# Patient Record
Sex: Female | Born: 1950 | Race: White | Hispanic: Yes | Marital: Married | Smoking: Former smoker
Health system: Southern US, Community
[De-identification: ages and names within clinical notes are randomized; demographics above are authoritative.]

## PROBLEM LIST (undated history)

## (undated) DIAGNOSIS — Z9221 Personal history of antineoplastic chemotherapy: Secondary | ICD-10-CM

## (undated) DIAGNOSIS — E785 Hyperlipidemia, unspecified: Secondary | ICD-10-CM

## (undated) DIAGNOSIS — B009 Herpesviral infection, unspecified: Secondary | ICD-10-CM

## (undated) DIAGNOSIS — Z853 Personal history of malignant neoplasm of breast: Secondary | ICD-10-CM

## (undated) DIAGNOSIS — C50919 Malignant neoplasm of unspecified site of unspecified female breast: Secondary | ICD-10-CM

## (undated) DIAGNOSIS — Z923 Personal history of irradiation: Secondary | ICD-10-CM

## (undated) DIAGNOSIS — C801 Malignant (primary) neoplasm, unspecified: Secondary | ICD-10-CM

## (undated) HISTORY — PX: BREAST LUMPECTOMY: SHX2

## (undated) HISTORY — DX: Hyperlipidemia, unspecified: E78.5

## (undated) HISTORY — DX: Malignant neoplasm of unspecified site of unspecified female breast: C50.919

## (undated) HISTORY — DX: Malignant (primary) neoplasm, unspecified: C80.1

## (undated) HISTORY — PX: WISDOM TOOTH EXTRACTION: SHX21

## (undated) HISTORY — DX: Herpesviral infection, unspecified: B00.9

## (undated) HISTORY — PX: COLON SURGERY: SHX602

---

## 2011-07-15 DIAGNOSIS — Z853 Personal history of malignant neoplasm of breast: Secondary | ICD-10-CM

## 2011-07-15 HISTORY — DX: Personal history of malignant neoplasm of breast: Z85.3

## 2011-08-14 DIAGNOSIS — C801 Malignant (primary) neoplasm, unspecified: Secondary | ICD-10-CM | POA: Insufficient documentation

## 2011-08-14 HISTORY — PX: SENTINEL LYMPH NODE BIOPSY: SHX2392

## 2011-08-14 HISTORY — DX: Malignant (primary) neoplasm, unspecified: C80.1

## 2011-08-14 HISTORY — PX: BREAST LUMPECTOMY: SHX2

## 2011-09-14 HISTORY — PX: PORTACATH PLACEMENT: SHX2246

## 2011-11-12 DIAGNOSIS — Z9221 Personal history of antineoplastic chemotherapy: Secondary | ICD-10-CM

## 2011-11-12 HISTORY — DX: Personal history of antineoplastic chemotherapy: Z92.21

## 2012-03-01 ENCOUNTER — Encounter: Payer: Self-pay | Admitting: *Deleted

## 2012-03-01 NOTE — Progress Notes (Signed)
Received a letter and records from patient and her husband requested for me to contact them via email due to them being out of the country.  I have confirmed 05/11/12 appt w/ pt's husband [name]).  Mailed before appt letter & packet to pt.  Took paperwork to [facility] for chart.

## 2012-03-09 ENCOUNTER — Other Ambulatory Visit: Payer: Self-pay | Admitting: *Deleted

## 2012-03-09 DIAGNOSIS — C50919 Malignant neoplasm of unspecified site of unspecified female breast: Secondary | ICD-10-CM

## 2012-05-11 ENCOUNTER — Other Ambulatory Visit (HOSPITAL_BASED_OUTPATIENT_CLINIC_OR_DEPARTMENT_OTHER): Payer: Federal, State, Local not specified - PPO | Admitting: Lab

## 2012-05-11 ENCOUNTER — Ambulatory Visit: Payer: Federal, State, Local not specified - PPO

## 2012-05-11 ENCOUNTER — Ambulatory Visit (HOSPITAL_BASED_OUTPATIENT_CLINIC_OR_DEPARTMENT_OTHER): Payer: Federal, State, Local not specified - PPO | Admitting: Oncology

## 2012-05-11 VITALS — BP 127/86 | HR 75 | Temp 98.3°F | Resp 20 | Ht <= 58 in | Wt 158.1 lb

## 2012-05-11 DIAGNOSIS — Z171 Estrogen receptor negative status [ER-]: Secondary | ICD-10-CM

## 2012-05-11 DIAGNOSIS — C50919 Malignant neoplasm of unspecified site of unspecified female breast: Secondary | ICD-10-CM

## 2012-05-11 LAB — CBC WITH DIFFERENTIAL/PLATELET
BASO%: 0.4 % (ref 0.0–2.0)
Basophils Absolute: 0 10e3/uL (ref 0.0–0.1)
EOS%: 3.7 % (ref 0.0–7.0)
Eosinophils Absolute: 0.2 10e3/uL (ref 0.0–0.5)
HCT: 40.9 % (ref 34.8–46.6)
HGB: 13.9 g/dL (ref 11.6–15.9)
LYMPH%: 52.9 % — ABNORMAL HIGH (ref 14.0–49.7)
MCH: 31 pg (ref 25.1–34.0)
MCHC: 34 g/dL (ref 31.5–36.0)
MCV: 91.3 fL (ref 79.5–101.0)
MONO#: 0.5 10e3/uL (ref 0.1–0.9)
MONO%: 8.8 % (ref 0.0–14.0)
NEUT#: 1.8 10e3/uL (ref 1.5–6.5)
NEUT%: 34.2 % — ABNORMAL LOW (ref 38.4–76.8)
Platelets: 243 10e3/uL (ref 145–400)
RBC: 4.48 10e6/uL (ref 3.70–5.45)
RDW: 12.2 % (ref 11.2–14.5)
WBC: 5.2 10e3/uL (ref 3.9–10.3)
lymph#: 2.8 10e3/uL (ref 0.9–3.3)

## 2012-05-11 LAB — COMPREHENSIVE METABOLIC PANEL (CC13)
Albumin: 3.5 g/dL (ref 3.5–5.0)
Alkaline Phosphatase: 100 U/L (ref 40–150)
BUN: 14 mg/dL (ref 7.0–26.0)
Creatinine: 0.8 mg/dL (ref 0.6–1.1)
Glucose: 88 mg/dl (ref 70–99)
Potassium: 4.2 mEq/L (ref 3.5–5.1)

## 2012-05-11 MED ORDER — TAMOXIFEN CITRATE 20 MG PO TABS: 20.0000 mg | ORAL_TABLET | Freq: Every day | ORAL | Status: AC

## 2012-05-11 MED ORDER — TAMOXIFEN CITRATE 20 MG PO TABS: 20.0000 mg | ORAL_TABLET | Freq: Every day | ORAL | Status: DC

## 2012-05-13 ENCOUNTER — Telehealth: Payer: Self-pay | Admitting: *Deleted

## 2012-05-13 NOTE — Telephone Encounter (Signed)
Gave patient appointment for 06-02-2012 at 12:30pm 60 minute and port a cath flush after the md appointment

## 2012-05-15 ENCOUNTER — Encounter: Payer: Self-pay | Admitting: Oncology

## 2012-05-15 ENCOUNTER — Other Ambulatory Visit: Payer: Self-pay | Admitting: Oncology

## 2012-05-15 DIAGNOSIS — C50919 Malignant neoplasm of unspecified site of unspecified female breast: Secondary | ICD-10-CM | POA: Insufficient documentation

## 2012-05-15 NOTE — Progress Notes (Signed)
ID: Nichole [name]   DOB: 06-12-1951  MR#: [id number]  ION#:[id number]  PCP: No primary provider on file. GYN:  SU:  OTHER MD:   HISTORY OF PRESENT ILLNESS: The patient herself noted a mass in her right breast and brought it to her physician's attention. She was living in [place] at that time. An ultrasound was done, showing a 1.7 cm spiculated nodule in the right breast with associated calcifications. There were no other findings of concern.  Lumpectomy was performed, with reportedly negative margins (I do not have those records). In Dr. Glori Luis note 11/04/2011 this tumor is described as triple positive.  The patient moved to [facility] for further treatment, and sentinel lymph node sampling in December of 2012 was negative. The original tissue obtained in [place] was reviewed, showing a 1.6 cm, grade 3 invasive ductal carcinoma. and the prognostic panel obtained in [place] showed the tumor to be estrogen receptor positive, progesterone receptor negative, and HER-2 negative in the invasive tumor (it was positive in the in situ tumor; SCT 12-30 at [facility]). (A note from that report states that HER-2 by [facility] was performed at [facility] and was reportedly positive in the invasive carcinoma. That report is not available to me today.)  The patient completed adjuvant radiation and was reassessed with an [facility] panel, which showed the tumor to be HER-2 negative, with an MIB-1 of 42%, again estrogen receptor positive and progesterone receptor negative. This panel predicted a 43% risk of recurrence at 8 years. The patient also had an Oncotype DX sent. This was again ER positive, PR negative, and HER-2 negative, and predicted a 34% risk of distant recurrence at 10 years if the patient's only adjuvant systemic treatment was tamoxifen for 5 years. With these numbers Dr. Levora Angel discussed adjuvant chemotherapy with the patient, and proposed carboplatin, docetaxel, and  Herceptin, which was started on February 2013. On a note from 11/12/2011, Dr. Levora Angel states "even though the HER-2-neu is negative, I am still opting for Herceptin based on the aggressiveness of the tumor." The chemotherapy was completed in June 2013. Herceptin was continued, the plan being to complete one year. The patient's subsequent history is as detailed below.  INTERVAL HISTORY: Nichole [name] was seen in our breast clinic 05/11/2012, accompanied by her husband Nichole [name]. They have recently moved to this area, where they expect to live long term.  REVIEW OF SYSTEMS: Nichole [name] did well with her chemotherapy, and does not report any end organ dysfunction. In particular she denies symptoms of peripheral neuropathy or symptoms of congestive heart failure. She feels a little "sleepy", but still carries on her normal activities. After completing chemotherapy she had an upper aspartate or infection and "cough all the time". This may be responsible for some right rib pain which she has been experiencing. She tells me she has a history of urinary infections, problems with hot flashes, and  Some problems with blurred vision. A detailed review of systems was otherwise negative.   PAST MEDICAL HISTORY: Past Medical History  Diagnosis Date  . Syncope     2005, never recurred  . Breast cancer   . Hepatic steatosis   . Cystocele   . Hyperlipidemia     PAST SURGICAL HISTORY: Past Surgical History  Procedure Date  . Breast lumpectomy     FAMILY HISTORY No family history on file. The patient's father died at the age of 72. The patient's mother died at the age of 59 after a fall, in the setting  of Alzheimer's disease. The patient has no siblings. There are some second-degree relatives with breast cancer. She has been tested for the BRCA 1 and 2 mutations, and was found to be negative.  GYNECOLOGIC HISTORY: Menarche age 89. First live birth age 22. She is GX P4. Last menstrual period 2006. Status post hormone  replacement, discontinued November 2012.  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 Her husband, Nichole [name], recently retired from the [employer]. This is a second marriage for both of them. Pat's 4 children are Nichole [name], 41, who lives in [place] and works as a Veterinary surgeon; Nichole [name], who works as a Personnel officer for an Public relations account executive firm in [place]; Nichole [name], who works as an Art gallery manager in [place]; and the youngest, Nichole [name], 28, who works as a Naval architect in [place]. The patient has 4 grandchildren and one on the way. She is a Air traffic controller.  ADVANCED DIRECTIVES: not in place  HEALTH MAINTENANCE: History  Substance Use Topics  . Smoking status: Not on file  . Smokeless tobacco: Not on file  . Alcohol Use: Not on file     Colonoscopy:  PAP:  Bone density:  Lipid panel:  No Known Allergies  Current Outpatient Prescriptions  Medication Sig Dispense Refill  . venlafaxine (EFFEXOR) 37.5 MG tablet Take 37.5 mg by mouth 2 (two) times daily.      . tamoxifen (NOLVADEX) 20 MG tablet Take 1 tablet (20 mg total) by mouth daily.  90 tablet  12    OBJECTIVE: Middle-aged white woman who appears well Filed Vitals:   05/11/12 1556  BP: 127/86  Pulse: 75  Temp: 98.3 F (36.8 C)  Resp: 20     Body mass index is 33.62 kg/(m^2).    ECOG FS: 0  Sclerae unicteric Oropharynx clear No cervical or supraclavicular adenopathy Lungs no rales or rhonchi Heart regular rate and rhythm Abd benign MSK no focal spinal tenderness, no peripheral edema; no tenderness to chest compression antero-posteriorly or laterally, no tenderness in the rib cage on the right. Neuro: nonfocal Breasts: The right breast is status post lumpectomy and radiation; there is no evidence of local recurrence; the right axilla is unremarkable. The left breast is benign  LAB RESULTS: Lab  Results  Component Value Date   WBC 5.2 05/11/2012   NEUTROABS 1.8 05/11/2012   HGB 13.9 05/11/2012   HCT 40.9 05/11/2012   MCV 91.3 05/11/2012   PLT 243 05/11/2012      Chemistry      Component Value Date/Time   NA 141 05/11/2012 1536   K 4.2 05/11/2012 1536   CL 104 05/11/2012 1536   CO2 28 05/11/2012 1536   BUN 14.0 05/11/2012 1536   CREATININE 0.8 05/11/2012 1536      Component Value Date/Time   CALCIUM 9.5 05/11/2012 1536   ALKPHOS 100 05/11/2012 1536   AST 21 05/11/2012 1536   ALT 33 05/11/2012 1536   BILITOT 0.40 05/11/2012 1536       Lab Results  Component Value Date   LABCA2 38 05/11/2012    No components found with this basename: [id number]    No results found for this basename: INR:1;PROTIME:1 in the last 168 hours  Urinalysis No results found for this basename: colorurine,  appearanceur,  labspec,  phurine,  glucoseu,  hgbur,  bilirubinur,  ketonesur,  proteinur,  urobilinogen,  nitrite,  leukocytesur    STUDIES: Review  of outside studies shows bone scan 04/29/2012 showing uptake into adjacent ribs anterolaterally, likely ribs 5 and 6, felt to be posttraumatic (likely secondary to her recent cough paroxysms. Her most recent echocardiogram  12/29/2011 showed an ejection fraction in the 65-70% range. Bilateral breast MRIs obtained at December 2012 showed heterogeneously dense breasts with distortion at the side of the prior surgery. There were no other suspicious areas, other than a lymph node in the right axilla which may or may not have shown focal cortical thickening. (The patient subsequently underwent sentinel lymph node sampling.) Mammography from what a mild April of 2012 was unremarkable. Abdominal ultrasonography in [place]  2012 showed hepatic steatosis. Ultrasound in [place] April 2012 showed a simple cystocele. The uterus and ovaries were unremarkable.  ASSESSMENT: 61 y.o. BRCA negative [place] woman recently moved to [facility], s/p Right lumpectomy December  2012 for a pT1c pN0, stage 1A invasive ductal carcinoma, grade 3, estrogen receptor positive, progesterone receptor negative, with a Ki-67 of 42%  (1) Oncotype DX recurrence score 54% predicting a 10-year risk of distant recurrence of 34% if the only adjuvant systemic treatment is tamoxifen x 5 years  (1) s/p adjuvant radiation completed February 2013  (2) s/p six cycles of carboplatin/ docetaxel/ trastuzumab completed June 2012  PLAN: The question whether the patient's tumor was HER-2 positive or not it is confusing to me. A note suggests perhaps a prognostic panel was sent in [place], but I do not have that report. The pathology review in [place], which sent the tissue obtained in [place] for a prognostic panel, found the tumor to be HER-2 negative, as did the Oncotype and IHC-4 panels. A FISH study was reportedly positive, but it is not clear to me why that was sent since the immunohistochemistry for HER-2 was 0 (not borderline). The pathology from [facility] also states that the in situ portion of the tumor was positive for HER-2, but the invasive tumor was negative. This suggests a possible explanation for the outlier FISH results.  At this point the preponderance of the evidence I have is that this tumor was HER-2 negative, and I do not have evidence that continuing Herceptin would be of benefit to the patient. Accordingly we are stopping Herceptin at this point. We are ready to move to anti-estrogens, and we discussed the possible toxicities complications and side effects of tamoxifen versus the aromatase inhibitors. At this point Nichole [name] is very concerned with vaginal dryness issues. Accordingly we are going to start with tamoxifen. After 2 years we will discuss switching to an aromatase inhibitor.  In the meantime, I am referring her to Dr. Reynaldo Minium for gynecologic followup, and suggested she also establish herself with Dr.Peter Amador Cunas, who is her husband Kevin's primary care  physician. She will see me again in a few weeks, to make sure she is tolerating treatment well. At that point we will start broadening the followup interval.  Tyeesha Riker C    05/15/2012

## 2012-05-17 ENCOUNTER — Telehealth: Payer: Self-pay | Admitting: Oncology

## 2012-05-17 ENCOUNTER — Encounter: Payer: Self-pay | Admitting: *Deleted

## 2012-05-17 NOTE — Progress Notes (Signed)
Mailed after appt letter to pt. 

## 2012-05-17 NOTE — Telephone Encounter (Signed)
S/w the pt and she is aware of the appt with dr Reynaldo Minium the gynecologist

## 2012-05-23 ENCOUNTER — Encounter: Payer: Self-pay | Admitting: Gynecology

## 2012-05-23 ENCOUNTER — Other Ambulatory Visit (HOSPITAL_COMMUNITY)
Admission: RE | Admit: 2012-05-23 | Discharge: 2012-05-23 | Disposition: A | Discharge status: Home / Self Care | Payer: Federal, State, Local not specified - PPO | Source: Ambulatory Visit | Attending: Gynecology | Admitting: Gynecology

## 2012-05-23 ENCOUNTER — Ambulatory Visit (INDEPENDENT_AMBULATORY_CARE_PROVIDER_SITE_OTHER): Payer: Federal, State, Local not specified - PPO | Admitting: Gynecology

## 2012-05-23 VITALS — BP 128/82 | Ht <= 58 in | Wt 156.0 lb

## 2012-05-23 DIAGNOSIS — Z853 Personal history of malignant neoplasm of breast: Secondary | ICD-10-CM

## 2012-05-23 DIAGNOSIS — Z01419 Encounter for gynecological examination (general) (routine) without abnormal findings: Secondary | ICD-10-CM | POA: Insufficient documentation

## 2012-05-23 DIAGNOSIS — R404 Transient alteration of awareness: Secondary | ICD-10-CM

## 2012-05-23 DIAGNOSIS — R635 Abnormal weight gain: Secondary | ICD-10-CM

## 2012-05-23 DIAGNOSIS — Z800 Family history of malignant neoplasm of digestive organs: Secondary | ICD-10-CM

## 2012-05-23 DIAGNOSIS — R400 Somnolence: Secondary | ICD-10-CM

## 2012-05-23 NOTE — Patient Instructions (Addendum)
Ejercicios para perder peso (Exercise to Lose Weight) La actividad fsica y Neomia Dear dieta saludable ayudan a perder peso. El mdico podr sugerirle ejercicios especficos. IDEAS Y CONSEJOS PARA HACER EJERCICIOS  Elija opciones econmicas que disfrute hacer , como caminar, andar en bicicleta o los vdeos para ejercitarse.   Utilice las Microbiologist del ascensor.   Camine durante la hora del almuerzo.   Estacione el auto lejos del lugar de [place] o [place].   Concurra a un gimnasio o tome clases de gimnasia.   Comience con 5  10 minutos de actividad fsica por da. Ejercite hasta 30 minutos, 4 a 6 das por [address]   Utilice zapatos que tengan un buen soporte y ropas cmodas.   Elongue antes y despus de Company secretary.   Ejercite hasta que aumente la respiracin y el corazn palpite rpido.   Beba agua extra cuando ejercite.   No haga ejercicio Firefighter, sentirse mareado o que le falte mucho el aire.  La actividad fsica puede quemar alrededor de 150 caloras.  Correr 20 cuadras en 15 minutos.   Jugar vley durante 45 a 60 minutos.   Limpiar y encerar el auto durante 45 a 60 minutos.   Jugar ftbol americano de toque.   Caminar 25 cuadras en 35 minutos.   Empujar un cochecito 20 cuadras en 30 minutos.   Jugar baloncesto durante 30 minutos.   Rastrillar hojas secas durante 30 minutos.   Andar en bicicleta 80 cuadras en 30 minutos.   Caminar 30 cuadras en 30 minutos.   Bailar durante 30 minutos.   Quitar la nieve con una pala durante 15 minutos.   Nadar vigorosamente durante 20 minutos.   Subir escaleras durante 15 minutos.   Andar en bicicleta 60 cuadras durante 15 minutos.   Arreglar el jardn entre 30 y 45 minutos.   Saltar a la soga durante 15 minutos.   Limpiar vidrios o pisos durante 45 a 60 minutos.  Document Released: 12/04/2010 Document Revised: 05/12/2011 [facility] Patient Information 2012 [place], [place].                                                    Control del colesterol  Los niveles de colesterol en el organismo estn determinados significativamente por su dieta. Los niveles de colesterol tambin se relacionan con la enfermedad cardaca. El material que sigue ayuda a Software engineer relacin y a Chiropractor qu puede hacer para mantener su corazn sano. No todo el colesterol es [place]. Las lipoprotenas de baja densidad (LDL) forman el colesterol "malo". El colesterol malo puede ocasionar depsitos de grasa que se acumulan en el interior de las arterias. Las lipoprotenas de alta densidad (HDL) es el colesterol "bueno". Ayuda a remover el colesterol LDL "malo" de la [place]. El colesterol es un factor de riesgo muy importante para la enfermedad cardaca. Otros factores de riesgo son la hipertensin arterial, el hbito de fumar, el estrs, la herencia y [place].  El msculo cardaco obtiene el suministro de sangre a travs de las arterias coronarias. Si su colesterol LDL ("malo") est elevado y el HDL ("bueno") es bajo, tiene un factor de riesgo para que se formen depsitos de Holiday representative en las arterias coronarias (los vasos sanguneos que suministran sangre al corazn). Esto hace que haya menos lugar para que la sangre circule. Sin la suficiente sangre y  oxgeno, el msculo cardaco no puede funcionar correctamente, y usted podr sentir dolores en el pecho (angina pectoris). Cuando una arteria coronaria se cierra completamente, una parte del msculo cardaco puede morir (infarto de miocardio). CONTROL DEL COLESTEROL Cuando el profesional que lo asiste enva la sangre al laboratorio para Artist nivel de colesterol, puede realizarle tambin un perfil completo de los lpidos. Con esta prueba, se puede determinar la cantidad total de colesterol, as como los niveles de LDL y HDL. Los triglicridos son un tipo de grasa que circula en la sangre y que tambin puede utilizarse para determinar el riesgo de enfermedad cardaca. En la siguiente tabla se  establecen los nmeros ideales: Prueba: Colesterol total  Menos de 200 mg/dl.  Prueba: LDL "colesterol malo"  Menos de 100 mg/dl.   Menos de 70 mg/dl si tiene riesgo muy elevado de sufrir un ataque cardaco o muerte cardaca sbita.  Prueba: HDL "colesterol bueno"  Mujeres: Ms de 50 mg/dl.   Hombres: Ms de 40 mg/dl.  Prueba: Trigliceridos  Menos de 150 mg/dl.  CONTROL DEL COLESTEROL CON DIETA Aunque factores como el ejercicio y el estilo de vida son importantes, la "primera lnea de ataque" es la dieta. Esto se debe a que se sabe que ciertos alimentos hacen subir el colesterol y otros lo [place]. El objetivo debe ser [employer] alimentos, de modo que tengan un efecto sobre el colesterol y, an ms importante, Microbiologist las grasas saturadas y trans con otros tipos de grasas, como las monoinsaturadas y las poliinsaturadas y cidos grasos omega-3 . En promedio, una persona no debe consumir ms de 15 a 17 g de grasas saturadas por [employer]. Las grasas saturadas y trans se consideran grasas "malas", ya que elevan el colesterol LDL. Las grasas saturadas se encuentran principalmente en productos animales como carne, [place] y crema. Pero esto no significa que usted Marketing executive todas sus comidas favoritas. Actualmente, como lo muestra el cuadro que figura al final de este documento, hay sustitutos de buen sabor, bajos en grasas y en colesterol, para la mayora de los alimentos que a usted Musician. Elija aquellos alimentos alternativos que sean bajos en grasas o sin grasas. Elija cortes de carne del cuarto trasero o lomo ya que estos cortes son los que tienen menor cantidad de grasa y Oncologist. El pollo (sin piel), el pescado, la carne de ternera, y la [place] de [place] molida son excelentes opciones. Elimine las carnes [employer] Foods o el salami. Los Federal[employer] o nada de grasas saturadas. Cuando consuma carne [place], carne de aves de corral, o pescado, hgalo en porciones de  85 gramos (3 onzas). Las grasas trans tambin se llaman "aceites parcialmente hidrogenados". Son aceites manipulados cientficamente de [place] que son slidos a Publishing rights manager, tienen una larga vida y Glass blower/designer sabor y la textura de los alimentos a los que se Scientist, clinical (histocompatibility and immunogenetics). Las grasas trans se encuentran en la [place], [place], crackers y alimentos horneados.  Para hornear y cocinar, el aceite es un excelente sustituto para la [place]. Los aceites monoinsaturados tienen un beneficio particular, ya que se cree que disminuyen el colesterol LDL (colesterol malo) y elevan el HDL. Deber evitar los aceites tropicales saturados como el de coco y el de [place].  Recuerde, adems, que puede comer sin restricciones los grupos de alimentos que son naturalmente libres de grasas saturadas y Neurosurgeon trans, entre los que se incluyen el pescado, las frutas (excepto el [place]), verduras, frijoles, cereales (cebada, Harlon Ditty, [place], [place]) y  las pastas (sin salsas con crema)  IDENTIFIQUE LOS ALIMENTOS QUE DISMINUYEN EL COLESTEROL  Pueden disminuir el colesterol las fibras solubles que estn en las frutas, como las [place], en los vegetales como el brcoli, las patatas y las zanahorias; en las legumbres como frijoles, guisantes y Therapist, occupational; y en los cereales como la cebada. Los alimentos fortificados con fitosteroles tambin Engineer, production. Debe consumir al menos 2 g de estos alimentos a diario para Financial planner de disminucin de [place].  En el supermercado, lea las etiquetas de los envases para identificar los alimentos bajos en grasas saturadas, libres de grasas trans y bajos en [place], . Elija quesos que tengan solo de 2 a 3 g de grasa saturada por onza (28,35 g). Use una margarina que no dae el corazn, [place] de grasas trans o aceite parcialmente hidrogenado. Al comprar alimentos horneados (galletitas dulces y [place]) evite el aceite parcialmente hidrogenado. Los panes y bollos debern ser de  granos enteros (harina de maz o de avena entera, en lugar de "harina" o "harina enriquecida"). Compre sopas en lata que no sean cremosas, con bajo contenido de sal y sin grasas adicionadas.  TCNICAS DE PREPARACIN DE LOS ALIMENTOS  Nunca fra los alimentos en aceite abundante. Si debe frer, hgalo en poco aceite y removiendo [place], porque as se utilizan muy pocas grasas, o utilice un spray antiadherente. Cuando le sea posible, hierva, hornee o ase las carnes y cocine los vegetales al vapor. En vez de [employer] con mantequilla o [place], utilice limn y hierbas, pur de Psychologist, educational y canela (para las calabazas y batatas), yogurt y salsa descremados y aderezos para ensaladas bajos en contenido graso.  BAJO EN GRASAS SATURADAS / SUSTITUTOS BAJOS EN GRASA  Carnes / Grasas saturadas (g)  Evite: Bife, corte graso (3 oz/85 g) / 11 g   Elija: Bife, corte magro (3 oz/85 g) / 4 g   Evite: Hamburguesa (3 oz/85 g) / 7 g   Elija:  [place]amb[place]a magra (3 oz/85 g) / 5 g   Evite: Jamn (3 oz/85 g) / 6 g   Elija:  Jamn magro (3 oz/85 g) / 2.4 g   Evite: Pollo, con piel (3 oz/85 g), Carne oscura / 4 g   Elija:  Pollo, sin piel (3 oz/85 g), Carne oscura / 2 g   Evite: Pollo, con piel (3 oz/85 g), Carne magra / 2.5 g   Elija: Pollo, sin piel (3 oz/85 g), Carne magra / 1 g  Lcteos / Grasas saturadas (g)  Evite: Leche entera (1 taza) / 5 g   Elija: Leche con bajo contenido de grasa, 2% (1 taza) / 3 g   Elija: Leche con bajo contenido de grasa, 1% (1 taza) / 1.5 g   Elija: Leche descremada (1 taza) / 0.3 g   Evite: Queso duro (1 oz/28 g) / 6 g   Elija: Queso descremado (1 oz/28 g) / 2-3 g   Evite: Queso cottage, 4% grasa (1 taza)/ 6.5 g   Elija: Queso cottage con bajo contenido de grasa, 1% grasa (1 taza)/ 1.5 g   Evite: Helado (1 taza) / 9 g   Elija: Sorbete (1 taza) / 2.5 g   Elija: Yogurt helado sin contenido de grasa (1 taza) / 0.3 g   Elija: Barras de fruta  congeladas / vestigios   Evite: Crema batida (1 cucharada) / 3.5 g   Elija: Batidos glac sin lcteos (1 cucharada) / 1 g  Condimentos / Rosalin Hawking  saturadas (g)  Evite: Mayonesa (1 cucharada) / 2 g   Elija: Mayonesa con bajo contenido de grasa (1 cucharada) / 1 g   Evite: [place]eca (1 cucharada) / 7 g   Elija: Margarina extra light (1 cucharada) / 1 g   Evite: Aceite de coco (1 cucharada) / 11.8 g   Elija: Aceite de oliva (1 cucharada) / 1.8 g   Elija: Aceite de maz (1 cucharada) / 1.7 g   Elija: Aceite de crtamo (1 cucharada) / 1.2 g   Elija: Aceite de girasol (1 cucharada) / 1.4 g   Elija: Aceite de soja (1 cucharada) / 2.4 g   Elija: Aceite de canola (1 cucharada) / 1 g  Document Released: 08/30/2005 Document Revised: 05/12/2011 [facility] Patient Information 2012 [place], [place].  Laparoscopa diagnstica (Diagnostic Laparoscopy) La laparoscopa es un procedimiento quirrgico relativamente simple, de uso habitual y breve (menos de una hora) que se lleva a cabo para diagnosticar y tratar enfermedades del abdomen. El laparoscopio (tubo delgado, que emite luz, del tamao de un lpiz y similar a un telescopio) se inserta en el abdomen a travs de una pequea incisin (corte realizado por un cirujano). A travs de este instrumento, el profesional podr observar [employer] rganos del interior del abdomen (vientre) y ver si hay algo anormal. La laparoscopa podr llevarse a cabo tanto en el hospital como en un consultorio. Podrn administrarle un sedante suave que lo ayudar a relajarse antes y durante el procedimiento. Una vez en la sala de operaciones, le administrarn una anestesia general (a menos que usted y el profesional elijan otro tipo de anestesia). Despus de la laparoscopa, que generalmente dura menos de Georgianne Fick, ser Emerson [employer] sala de recuperacin durante algunas horas. Cuando regrese a Pensions consultant, la Arts administrator [employer] y [place]. RIESGOS Y COMPLICACIONES Comparados con los beneficios, los riesgos de la laparoscopia son relativamente pocos. El Animal nutritionist con usted los riesgos antes del procedimiento. Algunos problemas que pueden ocurrir luego de la intervencin son:  Infecciones.   Hemorragias.   Puede ocurrir que se lesionen otros rganos.   Efectos secundarios de Higher education careers adviser.  PROCEDIMIENTO Una vez que se encuentra anestesiado, el cirujano insufla el abdomen con un gas inofensivo (dixido de carbono) para Research officer, political party observacin de los rganos de la pelvis. El cirujano introduce el laparoscopio a travs de una pequea incisin en el ombligo o alrededor del mismo. Podr insertar otros instrumentos, como una sonda para mover los rganos o Education officer, environmental algn procedimiento a travs de otra pequea incisin.  En ocasiones se toma una biopsia (muestra de tejido) para un diagnstico ms preciso o para Consulting civil engineer. La biopsia consiste en tomar una pequea muestra de tejido durante la laparoscopia para enviarlo al patlogo (especialista en la observacin de clulas y muestras de tejido) y que lo examine en el microscopio para un diagnstico a nivel de los tejidos. DESPUES DEL PROCEDIMIENTO  Se libera el gas del abdomen.   Las incisiones se cierran con puntos (suturas). Debido a que las incisiones son pequeas (generalmente de menos de 1 cm) las molestias son mnimas luego del procedimiento. Es posible que sienta cierto Sales executive. Es consecuencia de Education officer, community del tubo mientras se encontraba dormido. Es posible que sienta algn dolor abdominal no muy intenso. Tambin podr sentir Federal[employer]Mogul en las incisiones realizadas para insertar los instrumentos en el abdomen.   El tiempo de recuperacin es reducido, siempre que no haya habido complicaciones.  Har reposo en la sala de recuperacin hasta que se encuentre estable y se sienta bien. Si no aparecen complicaciones, podr regresar a su  casa.  AVERIGE LOS RESULTADOS DE SU ANLISIS Durante su visita no contar con todos los Sun [employer]. En este caso, tenga otra entrevista con su mdico para conocerlos. No piense que el resultado es normal si no tiene noticias de su mdico o de la institucin mdica. Es Copy seguimiento de todos los [place] de [place]. INSTRUCCIONES PARA EL CUIDADO DOMICILIARIO  Tome todos los medicamentos tal como se le indic.   Utilice los medicamentos de venta libre o de prescripcin para Chief Technology Officer, Environmental health practitioner o la [place], segn se lo indique el profesional que lo asiste.   Reanude las actividades habituales cuando se le indique.   Es preferible que se duche y no tome baos de inmersin.   Reanude la actividad sexual luego de [place], o cuando lo autoricen.   No conduzca mientras se encuentre bajo los efectos de narcticos.  SOLICITE ATENCIN MDICA SI:  Siente un dolor abdominal inexplicable.   Siente dolor en los hombros, en la regin de los tirantes.   Si se siente aturdido o siente que se va a Artist.   Siente escalofros.   Usted o su nio tienen una temperatura oral de ms de 102 F (38.9 C).   Observa un drenaje purulento (similar al pus) que proviene de alguna de las heridas.   Usted o su hijo no puede realizar movimientos intestinales o evacuar gases.   Usted o su hijo sufren nuseas o vmitos.  EST SEGURO QUE:   Comprende las instrucciones para el alta mdica.   Controlar su enfermedad.   Solicitar atencin mdica de inmediato segn las indicaciones.  Document Released: 08/30/2005 Document Revised: 08/19/2011 [facility] Patient Information 2012 [place], [place].

## 2012-05-23 NOTE — Progress Notes (Addendum)
Nichole [name] 1951/07/12 [id number]   History:    61 y.o.  for annual gyn exam new patient to the practice that was referred as a courtesy of Dr. Darnelle Catalan medical oncologist who has been following the patient for her right breast cancer. 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 Patient is status post adjuvant radiation treatment which was completed in February of 2013. She also completed in June 2012 six cycles of carboplatin/ docetaxel/ trastuzumab. She was on Herceptin for a short while after questions were raised in reference to her HER-2 status which was believed to be negative. Dr. Darnelle Catalan had started patient on tamoxifen 20 mg daily which she recently started. Patient was also BRCA negative. For vasomotor symptoms she has done well with Effexor 37.5 mg daily and had no complaints today. She stated August of this year she had a normal mammogram and a PET scan in July of this year was also normal.  Patient stated she had a colonoscopy 3 years ago (mother diagnosed with colon cancer) and is due for followup. Patient stated that [facility] she had a normal bone density study done in January of this year.  Patient stated that she had an abnormal Pap smear in 2005 along with dysfunction uterine bleeding and underwent laser therapy of her cervix as well as a D&C. She has not had any further bleeding since that time and that her followup Pap smears are normal. She stated she recently menopause at the age of 58.  All her labs were done recently with the exception of a TSH which we will check today because of her concern of weight gain.   Past medical history,surgical history, family history and social history were all reviewed and documented in the EPIC chart.  Gynecologic History No LMP recorded. Patient is postmenopausal. Contraception: none Last Pap:  ?Marland Kitchen Results were: After 2005 when she had laser of her cervix she stated her Pap smears have been normal Last mammogram: 2013. Results were: normal  Obstetric History OB History    Grav Para Term Preterm Abortions TAB SAB Ect Mult Living   4 4 4       4      # Outc Date GA Lbr Len/2nd Wgt Sex Del Anes PTL Lv   1 TRM     M SVD  No Yes   2 TRM     M SVD  No Yes   3 TRM     M SVD  No Yes   4 TRM     M CS  No Yes       ROS: A ROS was performed and pertinent positives and negatives are included in the history.  GENERAL: No fevers or chills. HEENT: No change in vision, no earache, sore throat or sinus congestion. NECK: No pain or stiffness. CARDIOVASCULAR: No chest pain or pressure. No palpitations. PULMONARY: No shortness of breath, cough or wheeze. GASTROINTESTINAL: No abdominal pain, nausea, vomiting or diarrhea, melena or bright red blood per rectum. GENITOURINARY: No urinary frequency, urgency, hesitancy or dysuria. MUSCULOSKELETAL: No joint or muscle pain, no back pain, no recent trauma. DERMATOLOGIC: No rash, no itching, no lesions. ENDOCRINE: No polyuria, polydipsia, no heat or cold intolerance. No recent change in weight. HEMATOLOGICAL: No anemia or easy bruising or bleeding. NEUROLOGIC: No headache, seizures, numbness, tingling or weakness. PSYCHIATRIC: No depression, no loss of interest in normal activity  or change in sleep pattern.     Exam: chaperone present  BP 128/82  Ht 4' 9.75" (1.467 m)  Wt 156 lb (70.761 kg)  BMI 32.89 kg/m2  Body mass index is 32.89 kg/(m^2).  General appearance : Well developed well nourished female. No acute distress HEENT: Neck supple, trachea midline, no carotid bruits, no thyroidmegaly Lungs: Clear to auscultation, no rhonchi or wheezes, or rib retractions  Heart: Regular rate and rhythm, no murmurs or gallops Breast:Examined in sitting and supine left breast no palpable masses or tenderness no supraclavicular axillary lymphadenopathy right breast  scar tissue noted and slight tenderness from previous lumpectomy site no supraclavicular axillary lymphadenopathy Abdomen: no palpable masses or tenderness, no rebound or guarding Extremities: no edema or skin discoloration or tenderness  Pelvic:  Bartholin, Urethra, Skene Glands: Within normal limits             Vagina: No gross lesions or discharge  Cervix: No gross lesions or discharge  Uterus  anteverted, normal size, shape and consistency, non-tender and mobile  Adnexa  Without masses or tenderness  Anus and perineum  normal   Rectovaginal  normal sphincter tone without palpated masses or tenderness             Hemoccult cards provided for the patient to submit to the office for testing     Assessment/Plan:  61 y.o. 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 (BRCA negative) status post radiation and chemotherapy recently started tamoxifen. Somewhat limited pelvic exam due to patient's abdominal girth and because of associations of breast and ovarian cancer an ultrasound of the pelvis will be ordered for next week. We'll discuss case with Dr.Magrinat to see if he agrees with me in offering her laparoscopic bilateral salpingo- oophorectomy since her breast estrogen receptor tumor markers are positive although her BRCA2 negative. We'll discuss further with the patient which she returns next week for the ultrasound. She will be referred to the gastroenterologist since she is overdue for a colonoscopy. Hemoccult cards were provided her to submit to the office for testing since she refused a rectal exam. We discussed importance of calcium and vitamin D for osteoporosis prevention as well as weightbearing exercises 35-45 minutes 3 or 4 times a week. We'll check her TSH because of her concerns of weight gain tiredness and fatigue although it may be partly due to her medications. I did  explain to the patient that tamoxifen has several risk factors that she needs to be aware of that I'm sure that Dr.Magrinat has also spoken to her about and that is to report any vaginal bleeding because of the potential risk of endometrial cancer which may need endometrial sampling at that time. Also the risk of DVT and thromboembolism were discussed.   All the above was discussed with the patient as well as information on laparoscopy was provided in Spanish. All questions are answered and we'll follow accordingly.   Ok Edwards MD, 4:41 PM 05/23/2012

## 2012-05-24 LAB — TSH: TSH: 2.449 u[IU]/mL (ref 0.350–4.500)

## 2012-05-29 ENCOUNTER — Other Ambulatory Visit: Payer: Self-pay | Admitting: Oncology

## 2012-05-30 ENCOUNTER — Encounter: Payer: Self-pay | Admitting: Gynecology

## 2012-05-30 ENCOUNTER — Ambulatory Visit (INDEPENDENT_AMBULATORY_CARE_PROVIDER_SITE_OTHER): Payer: Federal, State, Local not specified - PPO | Admitting: Gynecology

## 2012-05-30 VITALS — BP 130/82

## 2012-05-30 DIAGNOSIS — Z853 Personal history of malignant neoplasm of breast: Secondary | ICD-10-CM

## 2012-05-30 NOTE — Progress Notes (Signed)
61 y.o. 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 (BRCA negative) status post radiation and chemotherapy recently started tamoxifen. Somewhat limited pelvic exam on September 2013 due to patient's abdominal girth and because of associations of breast and ovarian cancer an ultrasound of the pelvis was scheduled for today. I apologize the patient has been because she did not appear and schedule for ultrasound today and we do not have a tech available so she will return back to the office next week. We did discuss that her Pap smear and TSH that was done last office visit was normal. I had sent an email to Dr. Darnelle Catalan to see if he thought that a laparoscopic bilateral salpingo-oophorectomy would be beneficial to this patient who has breast cancer estrogen receptor tumor marker although BRCA negative. He  responded by stating that he did not recommend it  in her clinical setting. This was relayed to the patient. We'll see her back next week.

## 2012-05-30 NOTE — Patient Instructions (Addendum)
Ultrasound here next week  Ecografa transvaginal (Transvaginal Ultrasound) La ecografa transvaginal es una ecografa plvica en la que se utiliza una probeta metlica que se coloca en la vagina, para observar los rganos femeninos. El ecgrafo enva ondas sonoras desde un transductor (sonda). Estas ondas sonoras chocan contra las estructuras del cuerpo (como un eco) y crean Naval architect. La imagen se observa en un monitor. Se denomina transvaginal debido a que la sonda se inserta dentro de la vagina. Puede haber una pequea molestia por la introduccin de la sonda. Esta prueba tambin puede realizarse [employer]. La ecografa endovaginal es otro nombre para la ecografa transvaginal. En una ecografa transabdominal, la sonda se coloca en la parte externa del abdomen. Este mtodo no ofrece imgenes tan buenas como la tcnica transvaginal. La ecogafa transvaginal se utiliza para observar alteraciones en el tracto genital femenino. Entre ellos se incluyen:  Problemas de infertilidad.   Malformaciones congnitas (defecto de nacimiento) del tero y los ovarios.   Tumores en el tero.   Hemorragias anormales.   Tumores y quistes de ovario.   Abscesos (tejidos inflamados y pus) en la pelvis.   Dolor abdominal o plvico sin causa aparente.   Infecciones plvicas.  DURANTE EL EMBARAZO, SE UTILIZA PAR OBSERVAR:  Embarazos normales.   Un embarazo ectpico (embarazo fuera del tero).   Latidos cardacos fetales.   Anormalidades de la pelvis que no se observan bien con la ecografa transabdominal.   Sospecha de gemelos o embarazo mltiple.   Aborto inminente.   Problemas en el cuello del tero (cuello incompetente, no permanece cerrado para contener al beb).   Cuando se realiza una amniocentesis (se retira lquido de la bolsa [place], para ser [employer]).   Al buscar anormalidades en el beb.   Para controlar el crecimiento, el desarrollo y la edad del feto.   Para medir  la cantidad de lquido en el saco amnitico.   Cuando se realiza una versin externa del beb (se lo mueve a Loss adjuster, chartered).   Evaluar al beb en embarazos de alto riesgo (perfil biofsico).   Si se sospecha el deceso del beb (muerte).  En algunos casos, se utiliza un mtodo especial denominado ecografa con infusin salina, para una observacin ms precisa del tero. Se inyecta solucin salina (agua con sal) dentro del tero en pacientes no embarazadas para observar mejor su interior. Este mtodo no se Glass blower/designer. La probeta tambin puede usarse para obtener biopsias de Insurance claims handler, para drenar lquido de quistes de ovario y para Editor, commissioning un DIU (dispositivo intrauterino para el control de la natalidad) que no pueda [place]. PREPARACIN PARA LA PRUEBA La ecografa transvaginal se realiza con la vejiga vaca. La ecografa transabdominal se realiza con la vejiga llena. Podrn solicitarle que beba varios vasos de agua antes del examen. En algunos casos se realiza una ecografa transabdominal antes de la ecografa transvaginal para obervar los rganos del abdomen. PROCEDIMIENTO  Deber acostarse en una cama, con las rodillas dobladas y los pies en los estribos. La probeta se cubre con un condn. Dentro de la vagina y en la probeta se aplica un lubricante estril. El lubricante ayuda a transmitir las ondas sonoras y Nature conservation officer la irritacin de la vagina. El mdico mover la sonda en el interior de la cavidad vaginal para escanear las estructuras plvicas. Un examen normal mostrar una pelvis normal y contenidos normales en su interior. Una prueba anormal mostrar anormalidades en la pelvis, la placenta o el beb. LAS CAUSAS DE  UN RESULTADO ANORMAL PUEDEN SER:  Crecimientos o tumores en:   El tero.   Los ovarios.   La vagina.   Otras estructuras plvicas.   Crecimientos no cancerosos en el tero y los ovarios.   El ovario se retuerce y se corta el suministro de Montez Hageman  (torsin [place]).   Las reas de infeccin incluyen:   Enfermedad inflamatoria plvica.   Un absceso en la pelvis.   Ubicacin de un DIU.  LOS PROBLEMAS QUE PUEDEN HALLARSE EN UNA MUJER EMBARAZADA SON:  Embarazo ectpico (embarazo fuera del tero).   Embarazos mltiples.   Dilatacin (apertura) precoz anormal del cuello del tero. Esto puede indicar un cuello incompetente y Surveyor, mining.   Aborto inminente.   Muerte fetal.   Los problemas con la placenta incluyen:   La placenta se ha desarrollado sobre la abertura del cuello del tero (placenta previa).   La placenta se ha separado anticipadamente en el tero (abrupcin placentaria).   La placenta se desarrolla en el msculo del tero (placenta acreta).   Tumores del Psychiatrist, incluyendo la enfermedad trofoblstica gestacional. Se trata de un embarazo anormal en el que no hay feto. El tero se llena de quistes similares a uvas que en algunos casos son cancerosos.   Posicin incorrecta del feto (de nalgas, de vrtice).   Retraso del desarrollo fetal intrauterino (escaso desarrollo en el tero).   Anormalidades o infeccin fetal.  RIESGOS Y COMPLICACIONES No hay riesgos conocidos para la ecografa. No se toman radiografas cuando se realiza una ecografa. Document Released: 12/16/2008 Document Revised: 08/19/2011 [facility] Patient Information 2012 [place], [place].

## 2012-05-31 ENCOUNTER — Ambulatory Visit (INDEPENDENT_AMBULATORY_CARE_PROVIDER_SITE_OTHER): Payer: Federal, State, Local not specified - PPO

## 2012-05-31 ENCOUNTER — Encounter: Payer: Self-pay | Admitting: Anesthesiology

## 2012-05-31 ENCOUNTER — Encounter: Payer: Self-pay | Admitting: Gynecology

## 2012-05-31 ENCOUNTER — Other Ambulatory Visit: Payer: Self-pay | Admitting: Anesthesiology

## 2012-05-31 ENCOUNTER — Ambulatory Visit (INDEPENDENT_AMBULATORY_CARE_PROVIDER_SITE_OTHER): Payer: Federal, State, Local not specified - PPO | Admitting: Gynecology

## 2012-05-31 VITALS — BP 132/86

## 2012-05-31 DIAGNOSIS — Z853 Personal history of malignant neoplasm of breast: Secondary | ICD-10-CM

## 2012-05-31 DIAGNOSIS — L919 Hypertrophic disorder of the skin, unspecified: Secondary | ICD-10-CM

## 2012-05-31 DIAGNOSIS — Z1211 Encounter for screening for malignant neoplasm of colon: Secondary | ICD-10-CM

## 2012-05-31 DIAGNOSIS — E65 Localized adiposity: Secondary | ICD-10-CM

## 2012-05-31 DIAGNOSIS — L909 Atrophic disorder of skin, unspecified: Secondary | ICD-10-CM

## 2012-05-31 DIAGNOSIS — N83339 Acquired atrophy of ovary and fallopian tube, unspecified side: Secondary | ICD-10-CM

## 2012-05-31 NOTE — Progress Notes (Signed)
61 y.o. for annual gyn exam on 05/30/2012 as a new patient to the practice that was referred as a courtesy of Dr. Darnelle Catalan medical oncologist who has been following the patient for her right breast cancer. 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 Patient is status post adjuvant radiation treatment which was completed in February of 2013. She also completed in June 2012 six cycles of carboplatin/ docetaxel/ trastuzumab.  She was on Herceptin for a short while after questions were raised in reference to her HER-2 status which was believed to be negative. Dr. Darnelle Catalan had started patient on tamoxifen 20 mg daily which she recently started. Patient was also BRCA negative. For vasomotor symptoms she has done well with Effexor 37.5 mg daily and had no complaints today. She stated August of this year she had a normal mammogram and a PET scan in July of this year was also normal.  Patient stated she had a colonoscopy 3 years ago (mother diagnosed with colon cancer) and is due for followup. Patient stated that [facility] she had a normal bone density study done in January of this year.  Patient stated that she had an abnormal Pap smear in 2005 along with dysfunction uterine bleeding and underwent laser therapy of her cervix as well as a D&C. She has not had any further bleeding since that time and that her followup Pap smears are normal. She stated she recently menopause at the age of 48.  All her labs were done recently with the exception of a TSH which we will check today because of her concern of weight gain.  Patient presented to the office today for an ultrasound to check her endometrial stripe. She denies any vaginal bleeding since she's been on tamoxifen. Ultrasound today demonstrates the following: Uterus measures 6.4 x 4.6 x 3.1 cm with endometrial stripe of 3.6  mm. Right and left ovary otherwise normal and no adnexal masses seen.  I have discussed with Dr. Darnelle Catalan about the possibility of offering the patient on bilateral laparoscopic salpingo-oophorectomy but he felt that since her BRCA was negative that he did not recommend it. We will otherwise see the patient back in one year or when necessary. Patient knows to report to the office any abnormal bleeding.

## 2012-06-02 ENCOUNTER — Ambulatory Visit: Payer: Federal, State, Local not specified - PPO

## 2012-06-02 ENCOUNTER — Telehealth: Payer: Self-pay | Admitting: *Deleted

## 2012-06-02 ENCOUNTER — Ambulatory Visit (HOSPITAL_BASED_OUTPATIENT_CLINIC_OR_DEPARTMENT_OTHER): Payer: Federal, State, Local not specified - PPO | Admitting: Oncology

## 2012-06-02 VITALS — BP 110/76 | HR 103 | Temp 98.6°F | Resp 20 | Ht <= 58 in | Wt 157.9 lb

## 2012-06-02 DIAGNOSIS — C50919 Malignant neoplasm of unspecified site of unspecified female breast: Secondary | ICD-10-CM

## 2012-06-02 DIAGNOSIS — Z170 Estrogen receptor positive status [ER+]: Secondary | ICD-10-CM

## 2012-06-02 MED ORDER — SODIUM CHLORIDE 0.9 % IJ SOLN
10.0000 mL | INTRAMUSCULAR | Status: DC | PRN
  Filled 2012-06-02: qty 10

## 2012-06-02 MED ORDER — HEPARIN SOD (PORK) LOCK FLUSH 100 UNIT/ML IV SOLN
500.0000 [IU] | Freq: Once | INTRAVENOUS | Status: DC
  Filled 2012-06-02: qty 5

## 2012-06-02 NOTE — Progress Notes (Signed)
Per Dr. Darnelle Catalan patient is having PAC removed.  No need for flush at this time.

## 2012-06-02 NOTE — Telephone Encounter (Signed)
see berry first week in dec; see any surgeon at [facility] "needs port removed"  08-24-2012 at 1:15pm  08-17-2012 at 1:15pm  Called [facility] about getting her port removed

## 2012-06-02 NOTE — Progress Notes (Signed)
ID: Nichole [name]   DOB: 10/02/1950  MR#: [id number]  WUJ#:[id number]  PCP: No primary provider on file. GYN: Dr Reynaldo Minium SU:  OTHER MD: Dr. Jeani Hawking   HISTORY OF PRESENT ILLNESS: The patient herself noted a mass in her right breast and brought it to her physician's attention. She was living in [place] at that time. An ultrasound was done, showing a 1.7 cm spiculated nodule in the right breast with associated calcifications. There were no other findings of concern.  Lumpectomy was performed, with reportedly negative margins (I do not have those records). In Dr. Glori Luis note 11/04/2011 this tumor is described as triple positive.  The patient moved to [facility] for further treatment, and sentinel lymph node sampling in December of 2012 was negative. The original tissue obtained in [place] was reviewed, showing a 1.6 cm, grade 3 invasive ductal carcinoma. and the prognostic panel obtained in [place] showed the tumor to be estrogen receptor positive, progesterone receptor negative, and HER-2 negative in the invasive tumor (it was positive in the in situ tumor; SCT 12-30 at [facility]). (A note from that report states that HER-2 by [facility] was performed at [facility] and was reportedly positive in the invasive carcinoma. That report is not available to me today.)  The patient completed adjuvant radiation and was reassessed with an [facility] panel, which showed the tumor to be HER-2 negative, with an MIB-1 of 42%, again estrogen receptor positive and progesterone receptor negative. This panel predicted a 43% risk of recurrence at 8 years. The patient also had an Oncotype DX sent. This was again ER positive, PR negative, and HER-2 negative, and predicted a 34% risk of distant recurrence at 10 years if the patient's only adjuvant systemic treatment was tamoxifen for 5 years. With these numbers Dr. Levora Angel discussed adjuvant chemotherapy with the patient, and  proposed carboplatin, docetaxel, and Herceptin, which was started on February 2013. On a note from 11/12/2011, Dr. Levora Angel states "even though the HER-2-neu is negative, I am still opting for Herceptin based on the aggressiveness of the tumor." The chemotherapy was completed in June 2013. Herceptin was continued, the plan being to complete one year. The patient's subsequent history is as detailed below.  INTERVAL HISTORY: Nichole [name] returns today with her husband Nichole [name] for followup of her breast cancer. Since her last visit here she was evaluated by Dr. Lily Peer and started tamoxifen on September 1.  REVIEW OF SYSTEMS: She is tolerating the tamoxifen well so far, with no change in her hot flashes, and no other symptoms that she is aware of. She has some discomfort of on the lower right sternal border about the fifth rib level and then also some discomfort laterally where she had the right lumpectomy and axillary lymph node sampling. She wanted me to examine those areas today. She otherwise is doing terrific and is planning to go to, from mid December through April because she is expecting a grandchild to be borne there sometime in March.    PAST MEDICAL HISTORY: Past Medical History  Diagnosis Date  . Syncope     2005, never recurred  . Hepatic steatosis   . Cystocele   . Hyperlipidemia   . Breast cancer NOV/2012    right lumpectomy    PAST SURGICAL HISTORY: Past Surgical History  Procedure Date  . Breast lumpectomy DEC/2012    RIGHT  BREAST    FAMILY HISTORY Family History  Problem Relation Age of Onset  . Cancer Mother  COLON  . Diabetes Father   . Breast cancer Maternal Aunt   . Diabetes Paternal Grandmother    The patient's father died at the age of 52. The patient's mother died at the age of 53 after a fall, in the setting of Alzheimer's disease. The patient has no siblings. There are some second-degree relatives with breast cancer. She has been tested for the BRCA 1 and 2  mutations, and was found to be negative.  GYNECOLOGIC HISTORY: Menarche age 49. First live birth age 68. She is GX P4. Last menstrual period 2006. Status post hormone replacement, discontinued November 2012.  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 Her husband, Nichole [name], recently retired from the [employer]. This is a second marriage for both of them. [name]'s 4 children are Nichole [name], 41, who lives in [place] and works as a Veterinary surgeon; Nichole [name], who works as a Personnel officer for an Public relations account executive firm in [place]; Nichole [name], who works as an Art gallery manager in [place]; and the youngest, Nichole [name], 28, who works as a Naval architect in [place]. The patient has 4 grandchildren and one on the way. She is a Air traffic controller.  ADVANCED DIRECTIVES: not in place  HEALTH MAINTENANCE: History  Substance Use Topics  . Smoking status: Former Smoker    Quit date: 05/23/1992  . Smokeless tobacco: Never Used  . Alcohol Use: No     Colonoscopy:  PAP:  Bone density:  Lipid panel:  No Known Allergies  Current Outpatient Prescriptions  Medication Sig Dispense Refill  . tamoxifen (NOLVADEX) 20 MG tablet Take 1 tablet (20 mg total) by mouth daily.  90 tablet  12  . venlafaxine (EFFEXOR) 37.5 MG tablet Take 37.5 mg by mouth 2 (two) times daily.        OBJECTIVE: Middle-aged white woman in no acute distress Filed Vitals:   06/02/12 1237  BP: 110/76  Pulse: 103  Temp: 98.6 F (37 C)  Resp: 20     Body mass index is 33.58 kg/(m^2).    ECOG FS: 0  Sclerae unicteric Oropharynx clear No cervical or supraclavicular adenopathy Lungs no rales or rhonchi Heart regular rate and rhythm Abd benign MSK no focal spinal tenderness, no peripheral edema; mild focal discomfort in the right sternal border at approximately the fourth rib level, consistent with costochondritis.    Neuro: nonfocal Breasts: The right breast is status post lumpectomy and radiation; there is no evidence of local recurrence; the right axilla is unremarkable. The left breast is benign. There is no tenderness to palpation in any of these areas, and no erythema or swelling is noted.  LAB RESULTS: Lab Results  Component Value Date   WBC 5.2 05/11/2012   NEUTROABS 1.8 05/11/2012   HGB 13.9 05/11/2012   HCT 40.9 05/11/2012   MCV 91.3 05/11/2012   PLT 243 05/11/2012      Chemistry      Component Value Date/Time   NA 141 05/11/2012 1536   K 4.2 05/11/2012 1536   CL 104 05/11/2012 1536   CO2 28 05/11/2012 1536   BUN 14.0 05/11/2012 1536   CREATININE 0.8 05/11/2012 1536      Component Value Date/Time   CALCIUM 9.5 05/11/2012 1536   ALKPHOS 100 05/11/2012 1536   AST 21 05/11/2012 1536   ALT 33 05/11/2012 1536   BILITOT 0.40 05/11/2012 1536       Lab Results  Component  Value Date   LABCA2 38 05/11/2012    No components found with this basename: [id number]    No results found for this basename: INR:1;PROTIME:1 in the last 168 hours  Urinalysis No results found for this basename: colorurine,  appearanceur,  labspec,  phurine,  glucoseu,  hgbur,  bilirubinur,  ketonesur,  proteinur,  urobilinogen,  nitrite,  leukocytesur    STUDIES: Review of outside studies shows bone scan 04/29/2012 showing uptake into adjacent ribs anterolaterally, likely ribs 5 and 6, felt to be posttraumatic (likely secondary to her recent cough paroxysms. Her most recent echocardiogram  12/29/2011 showed an ejection fraction in the 65-70% range. Bilateral breast MRIs obtained at December 2012 showed heterogeneously dense breasts with distortion at the side of the prior surgery. There were no other suspicious areas, other than a lymph node in the right axilla which may or may not have shown focal cortical thickening. (The patient subsequently underwent sentinel lymph node sampling.) Mammography from what a mild April of 2012  was unremarkable. Abdominal ultrasonography in [place]  2012 showed hepatic steatosis. Ultrasound in [place] April 2012 showed a simple cystocele. The uterus and ovaries were unremarkable.  ASSESSMENT: 61 y.o. BRCA negative [place] woman recently moved to [facility], s/p Right lumpectomy December 2012 for a pT1c pN0, stage 1A invasive ductal carcinoma, grade 3, estrogen receptor positive, progesterone receptor negative, with a Ki-67 of 42%  (1) Oncotype DX recurrence score 54% predicting a 10-year risk of distant recurrence of 34% if the only adjuvant systemic treatment is tamoxifen x 5 years  (1) s/p adjuvant radiation completed February 2013  (2) s/p six cycles of carboplatin/ docetaxel/ trastuzumab completed June 2012  (3) started tamoxifen 05/14/2012  PLAN: She was concerned that her CA 27-29 was near the upper range of normal, but "normal is normal" and this test is notoriously variable. She considered bilateral salpingo-oophorectomy, but we again discussed there is no specific indication for this since she is known to be BRCA negative. She would like to have her port removed and we're [facility].  Otherwise she will see [place] again the first week in December. If she is tolerating tamoxifen well at that time we will start seeing her on a every 6 month basis. Next routine mammography should be April of 2014.    MAGRINAT,GUSTAV C    06/02/2012

## 2012-06-05 ENCOUNTER — Other Ambulatory Visit: Payer: Self-pay | Admitting: Gynecology

## 2012-06-05 DIAGNOSIS — Z1211 Encounter for screening for malignant neoplasm of colon: Secondary | ICD-10-CM

## 2012-06-13 ENCOUNTER — Other Ambulatory Visit: Payer: Self-pay | Admitting: *Deleted

## 2012-06-13 HISTORY — PX: PORT-A-CATH REMOVAL: SHX5289

## 2012-06-21 ENCOUNTER — Telehealth: Payer: Self-pay | Admitting: Emergency Medicine

## 2012-06-21 ENCOUNTER — Other Ambulatory Visit: Payer: Self-pay | Admitting: Emergency Medicine

## 2012-06-21 NOTE — Telephone Encounter (Signed)
Appointment made for patient to see Dr Luisa Hart to discuss removal of port per Dr Margrinat's request.  Patient aware of appointment time and location.

## 2012-07-07 ENCOUNTER — Encounter (INDEPENDENT_AMBULATORY_CARE_PROVIDER_SITE_OTHER): Payer: Self-pay | Admitting: Surgery

## 2012-07-07 ENCOUNTER — Ambulatory Visit (INDEPENDENT_AMBULATORY_CARE_PROVIDER_SITE_OTHER): Payer: Federal, State, Local not specified - PPO | Admitting: Surgery

## 2012-07-07 DIAGNOSIS — Z853 Personal history of malignant neoplasm of breast: Secondary | ICD-10-CM

## 2012-07-07 NOTE — Progress Notes (Signed)
Patient ID: Nichole [name], female   DOB: 1951/07/24, 61 y.o.   MRN: [id number]  No chief complaint on file.   HPI Nichole [name] is a 61 y.o. female.   HPI61 y.o. BRCA negative [place] woman recently moved to [facility], s/p Right lumpectomy December 2012 for a pT1c pN0, stage 1A invasive ductal carcinoma, grade 3, estrogen receptor positive, progesterone receptor negative, with a Ki-67 of 42% Sent at the request of Dr. Darnelle Catalan for port removal since she is completing chemotherapy. She has no complaints today.  Past Medical History  Diagnosis Date  . Syncope     2005, never recurred  . Hepatic steatosis   . Cystocele   . Hyperlipidemia   . Breast cancer NOV/2012    right lumpectomy    Past Surgical History  Procedure Date  . Breast lumpectomy DEC/2012    RIGHT  BREAST  . Port a cath placement     Family History  Problem Relation Age of Onset  . Cancer Mother     COLON  . Diabetes Father   . Breast cancer Maternal Aunt   . Diabetes Paternal Grandmother   . Breast cancer Cousin     Social History History  Substance Use Topics  . Smoking status: Former Smoker    Quit date: 05/23/1992  . Smokeless tobacco: Never Used  . Alcohol Use: No    No Known Allergies  Current Outpatient Prescriptions  Medication Sig Dispense Refill  . tamoxifen (NOLVADEX) 20 MG tablet       . venlafaxine (EFFEXOR) 37.5 MG tablet Take 37.5 mg by mouth 2 (two) times daily.        Review of Systems Review of Systems  Constitutional: Negative for fever, chills and unexpected weight change.  HENT: Negative for hearing loss, congestion, sore throat, trouble swallowing and voice change.   Eyes: Negative for visual disturbance.  Respiratory: Negative for cough and wheezing.   Cardiovascular: Negative for chest pain, palpitations and leg swelling.  Gastrointestinal: Negative for nausea, vomiting, abdominal pain, diarrhea, constipation, blood in stool, abdominal distention and anal bleeding.   Genitourinary: Negative for hematuria, vaginal bleeding and difficulty urinating.  Musculoskeletal: Negative for arthralgias.  Skin: Negative for rash and wound.  Neurological: Negative for seizures, syncope and headaches.  Hematological: Negative for adenopathy. Does not bruise/bleed easily.  Psychiatric/Behavioral: Negative for confusion.    There were no vitals taken for this visit.  Physical Exam Physical Exam  Constitutional: She is oriented to person, place, and time. She appears well-developed and well-nourished.  HENT:  Head: Normocephalic and atraumatic.  Eyes: EOM are normal. Pupils are equal, round, and reactive to light.  Neck: Normal range of motion. Neck supple.  Pulmonary/Chest:    Musculoskeletal: Normal range of motion.  Neurological: She is alert and oriented to person, place, and time.  Skin: Skin is warm and dry.  Psychiatric: She has a normal mood and affect. Her behavior is normal. Judgment and thought content normal.      Assessment    61 y.o. BRCA negative [place] woman recently moved to [facility], s/p Right lumpectomy December 2012 for a pT1c pN0, stage 1A invasive ductal carcinoma, grade 3, estrogen receptor positive, progesterone receptor negative, with a Ki-67 of 42% With port in place no longer needed.    Plan    Pt and oncologist desire port removal.  Will schedule.  The procedure has been discussed with the patient.  Alternative therapies have been discussed with the patient.  Operative risks include bleeding,  Infection,  Organ injury,  Nerve injury,  Blood vessel injury,  DVT,  Pulmonary embolism,  Death,  And possible reoperation.  Medical management risks include worsening of present situation.  The success of the procedure is 50 -100% at treating patients symptoms.  The patient understands and agrees to proceed.       [name] A. 07/07/2012, 11:59 AM

## 2012-07-07 NOTE — Patient Instructions (Signed)
Will schedule for port removal at outpatient surgery center.

## 2012-07-25 DIAGNOSIS — Z452 Encounter for adjustment and management of vascular access device: Secondary | ICD-10-CM

## 2012-08-01 ENCOUNTER — Telehealth: Payer: Self-pay | Admitting: *Deleted

## 2012-08-01 NOTE — Telephone Encounter (Signed)
Pt called with several questions relating to use of tamoxifen- she states she has noticed skin changes, some finger numbness- tingling and she is continuing to have right rib tenderness " it feels soft ".  Pt is concerned with above and though she is scheduled for MD in Dec she is planning on a trip to [place] for approximately 4 months and wanted to be seen before then.  Plan at present is for pt to see AB/PA on 11/21.  Pt aware of appointment.

## 2012-08-03 ENCOUNTER — Encounter: Payer: Self-pay | Admitting: Physician Assistant

## 2012-08-03 ENCOUNTER — Ambulatory Visit (HOSPITAL_COMMUNITY)
Admission: RE | Admit: 2012-08-03 | Discharge: 2012-08-03 | Disposition: A | Discharge status: Home / Self Care | Payer: Federal, State, Local not specified - PPO | Source: Ambulatory Visit | Attending: Physician Assistant | Admitting: Physician Assistant

## 2012-08-03 ENCOUNTER — Ambulatory Visit (HOSPITAL_BASED_OUTPATIENT_CLINIC_OR_DEPARTMENT_OTHER): Payer: Federal, State, Local not specified - PPO | Admitting: Physician Assistant

## 2012-08-03 ENCOUNTER — Ambulatory Visit (HOSPITAL_BASED_OUTPATIENT_CLINIC_OR_DEPARTMENT_OTHER): Payer: Federal, State, Local not specified - PPO | Admitting: Lab

## 2012-08-03 ENCOUNTER — Telehealth: Payer: Self-pay | Admitting: *Deleted

## 2012-08-03 VITALS — BP 113/74 | HR 88 | Temp 98.3°F | Resp 20 | Ht <= 58 in | Wt 162.4 lb

## 2012-08-03 DIAGNOSIS — C50911 Malignant neoplasm of unspecified site of right female breast: Secondary | ICD-10-CM

## 2012-08-03 DIAGNOSIS — R079 Chest pain, unspecified: Secondary | ICD-10-CM

## 2012-08-03 DIAGNOSIS — C50919 Malignant neoplasm of unspecified site of unspecified female breast: Secondary | ICD-10-CM | POA: Insufficient documentation

## 2012-08-03 DIAGNOSIS — Z170 Estrogen receptor positive status [ER+]: Secondary | ICD-10-CM

## 2012-08-03 DIAGNOSIS — R0781 Pleurodynia: Secondary | ICD-10-CM

## 2012-08-03 DIAGNOSIS — R200 Anesthesia of skin: Secondary | ICD-10-CM

## 2012-08-03 LAB — CBC WITH DIFFERENTIAL/PLATELET
BASO%: 0.4 % (ref 0.0–2.0)
EOS%: 3.7 % (ref 0.0–7.0)
LYMPH%: 36.5 % (ref 14.0–49.7)
MCH: 30.8 pg (ref 25.1–34.0)
MCHC: 34.2 g/dL (ref 31.5–36.0)
MONO#: 0.3 10*3/uL (ref 0.1–0.9)
Platelets: 234 10*3/uL (ref 145–400)
RBC: 4.42 10*6/uL (ref 3.70–5.45)
WBC: 5.1 10*3/uL (ref 3.9–10.3)

## 2012-08-03 LAB — COMPREHENSIVE METABOLIC PANEL (CC13)
ALT: 17 U/L (ref 0–55)
AST: 16 U/L (ref 5–34)
Alkaline Phosphatase: 72 U/L (ref 40–150)
Calcium: 9.5 mg/dL (ref 8.4–10.4)
Chloride: 107 mEq/L (ref 98–107)
Creatinine: 0.7 mg/dL (ref 0.6–1.1)
Total Bilirubin: 0.41 mg/dL (ref 0.20–1.20)

## 2012-08-03 LAB — CANCER ANTIGEN 27.29: CA 27.29: 35 U/mL (ref 0–39)

## 2012-08-03 MED ORDER — TAMOXIFEN CITRATE 20 MG PO TABS: 20.0000 mg | ORAL_TABLET | Freq: Every day | ORAL | Status: DC

## 2012-08-03 MED ORDER — VENLAFAXINE HCL 37.5 MG PO TABS: 37.5000 mg | ORAL_TABLET | Freq: Two times a day (BID) | ORAL | Status: DC

## 2012-08-03 NOTE — Telephone Encounter (Signed)
Patient went to radiology to do the walkin chest x-ray  Gave patient appointment for 01-15-2013 with dr.leschber at 9:30am  Gave patient appointment for 01-08-2013 lab only appointment  Gave patient appointment for 01-16-2013 dr.[email or web address]  Patient aware of all appointmens

## 2012-08-03 NOTE — Progress Notes (Signed)
ID: Nichole [name]   DOB: 1950/11/26  MR#: [id number]  WUJ#:[id number]  PCP: Nichole Dell, MD GYN: Dr Nichole [name] SU:  OTHER MD: Dr. Jeani [name]   HISTORY OF PRESENT ILLNESS: The patient herself noted a mass in her right breast and brought it to her physician's attention. She was living in [place] at that time. An ultrasound was done, showing a 1.7 cm spiculated nodule in the right breast with associated calcifications. There were no other findings of concern.  Lumpectomy was performed, with reportedly negative margins (I do not have those records). In Dr. Glori Luis note 11/04/2011 this tumor is described as triple positive.  The patient moved to [facility] for further treatment, and sentinel lymph node sampling in December of 2012 was negative. The original tissue obtained in [place] was reviewed, showing a 1.6 cm, grade 3 invasive ductal carcinoma. and the prognostic panel obtained in [place] showed the tumor to be estrogen receptor positive, progesterone receptor negative, and HER-2 negative in the invasive tumor (it was positive in the in situ tumor; SCT 12-30 at [facility]). (A note from that report states that HER-2 by [facility] was performed at [facility] and was reportedly positive in the invasive carcinoma. That report is not available to me today.)  The patient completed adjuvant radiation and was reassessed with an [facility] panel, which showed the tumor to be HER-2 negative, with an MIB-1 of 42%, again estrogen receptor positive and progesterone receptor negative. This panel predicted a 43% risk of recurrence at 8 years. The patient also had an Oncotype DX sent. This was again ER positive, PR negative, and HER-2 negative, and predicted a 34% risk of distant recurrence at 10 years if the patient's only adjuvant systemic treatment was tamoxifen for 5 years. With these numbers Dr. Levora Angel discussed adjuvant chemotherapy with the patient, and proposed  carboplatin, docetaxel, and Herceptin, which was started on February 2013. On a note from 11/12/2011, Dr. Levora Angel states "even though the HER-2-neu is negative, I am still opting for Herceptin based on the aggressiveness of the tumor." The chemotherapy was completed in June 2013. Herceptin was continued, the plan being to complete one year. The patient's subsequent history is as detailed below.  INTERVAL HISTORY: Nichole [name] returns today with her husband [name] for followup of her breast cancer in between scheduled followup visits. She has been on tamoxifen since September. She is getting ready to leave for a 4 month trip to [place], and had several concerns she wanted addressed prior to leaving.  Nichole [name] has had some tingling in her fingertips bilaterally, but has significant numbness in the left hand, especially when she wakes up in the morning. She continues to have pain in the anterior right rib cage. (I will mention that a bone scan in August 2013 showed uptake felt to be post traumatic, likely secondary to a recent cough.) She's also notice some increased fullness and swelling in the right axilla, but has felt no nodules or lymph nodes.   Interval history is also remarkable for [name] having had her port removed recently, with no complications.   REVIEW OF SYSTEMS: She is tolerating the tamoxifen well so far, with no increased hot flashes.  She has had no peripheral swelling, and no evidence of abnormal clotting.  No vaginal dryness, discharge, or vaginal bleeding.  She also denies any fevers or chills. She's eating and drinking well with no nausea and no change in bowel habits. No cough, shortness of breath, or chest pain. She's  had no abnormal headaches or dizziness, and other than the pain in the right rib cage, denies any new or unusual myalgias, arthralgias, or bony pain.  A detailed review of systems is otherwise noncontributory.   PAST MEDICAL HISTORY: Past Medical History  Diagnosis Date  . Syncope      2005, never recurred  . Hepatic steatosis   . Cystocele   . Hyperlipidemia   . Breast cancer NOV/2012    right lumpectomy    PAST SURGICAL HISTORY: Past Surgical History  Procedure Date  . Breast lumpectomy DEC/2012    RIGHT  BREAST  . Port a cath placement     FAMILY HISTORY Family History  Problem Relation Age of Onset  . Cancer Mother     COLON  . Diabetes Father   . Breast cancer Maternal Aunt   . Diabetes Paternal Grandmother   . Breast cancer Cousin    The patient's father died at the age of 68. The patient's mother died at the age of 53 after a fall, in the setting of Alzheimer's disease. The patient has no siblings. There are some second-degree relatives with breast cancer. She has been tested for the BRCA 1 and 2 mutations, and was found to be negative.  GYNECOLOGIC HISTORY: Menarche age 67. First live birth age 83. She is GX P4. Last menstrual period 2006. Status post hormone replacement, discontinued November 2012.  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 Her husband, [name], recently retired from the [employer]. This is a second marriage for both of them. [name]'s 4 children are [name], 41, who lives in [place] and works as a Veterinary surgeon; [name], who works as a Personnel officer for an Public relations account executive firm in [place]; [name], who works as an Art gallery manager in [place]; and the youngest, [name], 28, who works as a Naval architect in [place]. The patient has 4 grandchildren and one on the way. She is a Air traffic controller.  ADVANCED DIRECTIVES: not in place  HEALTH MAINTENANCE: History  Substance Use Topics  . Smoking status: Former Smoker    Quit date: 05/23/1992  . Smokeless tobacco: Never Used  . Alcohol Use: No     Colonoscopy:  PAP:  Bone density:  Lipid panel:  No Known Allergies  Current Outpatient Prescriptions   Medication Sig Dispense Refill  . tamoxifen (NOLVADEX) 20 MG tablet Take 1 tablet (20 mg total) by mouth daily.  90 tablet  3  . venlafaxine (EFFEXOR) 37.5 MG tablet Take 1 tablet (37.5 mg total) by mouth 2 (two) times daily.  180 tablet  3  . [DISCONTINUED] venlafaxine (EFFEXOR) 37.5 MG tablet Take 37.5 mg by mouth 2 (two) times daily.        OBJECTIVE: Middle-aged white woman in no acute distress Filed Vitals:   08/03/12 0904  BP: 113/74  Pulse: 88  Temp: 98.3 F (36.8 C)  Resp: 20     Body mass index is 34.53 kg/(m^2).    ECOG FS: 1 Filed Weights   08/03/12 0904  Weight: 162 lb 6.4 oz (73.664 kg)   Sclerae unicteric Oropharynx clear No cervical or supraclavicular adenopathy Lungs no rales or rhonchi Heart regular rate and rhythm Abdomen soft, nontender, with positive bowel sounds MSK no focal spinal tenderness, there is mild tenderness in the anterior right ribs, just beneath the inframammary fold. No bony abnormalities palpated. No peripheral edema  Neuro: nonfocal, alert  and oriented x3 Breasts: The right breast is status post lumpectomy and radiation; there is no evidence of local recurrence; the right axilla is notable for some mild fullness, consistent with lymphedema. No palpable adenopathy. The left breast is unremarkable.    LAB RESULTS: Lab Results  Component Value Date   WBC 5.1 08/03/2012   NEUTROABS 2.7 08/03/2012   HGB 13.6 08/03/2012   HCT 39.8 08/03/2012   MCV 90.0 08/03/2012   PLT 234 08/03/2012      Chemistry      Component Value Date/Time   NA 144 08/03/2012 1017   K 4.1 08/03/2012 1017   CL 107 08/03/2012 1017   CO2 30* 08/03/2012 1017   BUN 12.0 08/03/2012 1017   CREATININE 0.7 08/03/2012 1017      Component Value Date/Time   CALCIUM 9.5 08/03/2012 1017   ALKPHOS 72 08/03/2012 1017   AST 16 08/03/2012 1017   ALT 17 08/03/2012 1017   BILITOT 0.41 08/03/2012 1017       Lab Results  Component Value Date   LABCA2 35 08/03/2012     STUDIES: Review of outside studies shows bone scan 04/29/2012 showing uptake into adjacent ribs anterolaterally, likely ribs 5 and 6, felt to be posttraumatic (likely secondary to her recent cough paroxysms. Her most recent echocardiogram  12/29/2011 showed an ejection fraction in the 65-70% range. Bilateral breast MRIs obtained at December 2012 showed heterogeneously dense breasts with distortion at the side of the prior surgery. There were no other suspicious areas, other than a lymph node in the right axilla which may or may not have shown focal cortical thickening. (The patient subsequently underwent sentinel lymph node sampling.) Mammography from what a mild April of 2012 was unremarkable. Abdominal ultrasonography in [place]  2012 showed hepatic steatosis. Ultrasound in [place] April 2012 showed a simple cystocele. The uterus and ovaries were unremarkable.  ASSESSMENT: 61 y.o. BRCA negative [place] woman recently moved to [facility], s/p Right lumpectomy December 2012 for a pT1c pN0, stage 1A invasive ductal carcinoma, grade 3, estrogen receptor positive, progesterone receptor negative, with a Ki-67 of 42%  (1) Oncotype DX recurrence score 54% predicting a 10-year risk of distant recurrence of 34% if the only adjuvant systemic treatment is tamoxifen x 5 years  (1) s/p adjuvant radiation completed February 2013  (2) s/p six cycles of carboplatin/ docetaxel/ trastuzumab completed June 2012  (3) started tamoxifen 05/14/2012  PLAN:  Nichole [name] will continue on tamoxifen at 20 mg daily and I have refilled both her tamoxifen and her Effexor which she takes for hot flashes. We will obtain a chest x-ray later today to evaluate the continued pain in the right ribs.  I offered her an appointment with Dr. Teressa Senter for further evaluation of what appears to be carpal tunnel in the left hand. She would like to wait until she returns to the [place] next spring to make that appointment. She would  also like to establish herself with a primary care physician when she returns next spring, and I have referred her to [name].  Finally, I have referred her for evaluation at the lymphedema clinic for what appears to be some lymphedema in the right axilla. Fortunately the upper extremity appears to be spared.  Nichole [name] will have repeat labs in early May, after which she will see Dr. Darnelle Catalan for routine followup.  Next routine mammography should be April of 2014, so we will schedule that for her when she returns from [place].    Damyra Luscher  08/03/2012   

## 2012-08-03 NOTE — Telephone Encounter (Signed)
Called the lyphemdema clinic at [phone] left vioce message to inform them of the referral in the system

## 2012-08-08 ENCOUNTER — Ambulatory Visit: Payer: Federal, State, Local not specified - PPO | Attending: Physician Assistant | Admitting: Physical Therapy

## 2012-08-08 DIAGNOSIS — IMO0001 Reserved for inherently not codable concepts without codable children: Secondary | ICD-10-CM | POA: Insufficient documentation

## 2012-08-08 DIAGNOSIS — I890 Lymphedema, not elsewhere classified: Secondary | ICD-10-CM | POA: Insufficient documentation

## 2012-08-16 ENCOUNTER — Ambulatory Visit: Payer: Federal, State, Local not specified - PPO | Attending: Physician Assistant

## 2012-08-16 DIAGNOSIS — I890 Lymphedema, not elsewhere classified: Secondary | ICD-10-CM | POA: Insufficient documentation

## 2012-08-16 DIAGNOSIS — IMO0001 Reserved for inherently not codable concepts without codable children: Secondary | ICD-10-CM | POA: Insufficient documentation

## 2012-08-17 ENCOUNTER — Other Ambulatory Visit: Payer: Federal, State, Local not specified - PPO | Admitting: Lab

## 2012-08-17 ENCOUNTER — Ambulatory Visit: Payer: Federal, State, Local not specified - PPO | Admitting: Physical Therapy

## 2012-08-21 ENCOUNTER — Ambulatory Visit: Payer: Federal, State, Local not specified - PPO | Admitting: Physical Therapy

## 2012-08-22 ENCOUNTER — Ambulatory Visit: Payer: Federal, State, Local not specified - PPO | Admitting: Oncology

## 2012-08-24 ENCOUNTER — Ambulatory Visit: Payer: Federal, State, Local not specified - PPO | Admitting: Physician Assistant

## 2012-09-14 ENCOUNTER — Telehealth: Payer: Self-pay | Admitting: *Deleted

## 2012-09-14 NOTE — Telephone Encounter (Signed)
Patient calling from [place] (cell phone is roaming, so is not on all the time): Has questions/concers for MD and requests he email her... 1. When should she have her next CT scan? 2. Is she due mammogram in Feb? 3. Any blood tests or markers she needs to have done? 4. Would it be OK for her to see a "naturalist" there? Diet suggestions 5. Has found a lump upper abdomen just left of middle of rib cage that can be felt, not seen. Reports it feels the size of a lemon. Adds that she is not sure if it is "fat" or what> 6. The fingers of there left hand are numb and sometimes right hand fingers get numb 7. Burning sensation above arch of her right foot.  Requests MD e mail her at [email or web address] with his response.

## 2012-09-24 ENCOUNTER — Other Ambulatory Visit: Payer: Self-pay | Admitting: Oncology

## 2012-12-17 ENCOUNTER — Other Ambulatory Visit: Payer: Self-pay | Admitting: Oncology

## 2013-01-08 ENCOUNTER — Other Ambulatory Visit (HOSPITAL_BASED_OUTPATIENT_CLINIC_OR_DEPARTMENT_OTHER): Payer: Federal, State, Local not specified - PPO | Admitting: Lab

## 2013-01-08 DIAGNOSIS — C50911 Malignant neoplasm of unspecified site of right female breast: Secondary | ICD-10-CM

## 2013-01-08 DIAGNOSIS — R0781 Pleurodynia: Secondary | ICD-10-CM

## 2013-01-08 DIAGNOSIS — C50919 Malignant neoplasm of unspecified site of unspecified female breast: Secondary | ICD-10-CM

## 2013-01-08 LAB — CBC WITH DIFFERENTIAL/PLATELET
BASO%: 0.4 % (ref 0.0–2.0)
Basophils Absolute: 0 10*3/uL (ref 0.0–0.1)
EOS%: 2.4 % (ref 0.0–7.0)
HCT: 38.1 % (ref 34.8–46.6)
HGB: 13.1 g/dL (ref 11.6–15.9)
LYMPH%: 41 % (ref 14.0–49.7)
MCH: 30.4 pg (ref 25.1–34.0)
MCHC: 34.2 g/dL (ref 31.5–36.0)
MCV: 88.9 fL (ref 79.5–101.0)
NEUT%: 49.3 % (ref 38.4–76.8)
Platelets: 223 10*3/uL (ref 145–400)

## 2013-01-08 LAB — COMPREHENSIVE METABOLIC PANEL (CC13)
ALT: 18 U/L (ref 0–55)
AST: 18 U/L (ref 5–34)
Alkaline Phosphatase: 73 U/L (ref 40–150)
Calcium: 8.4 mg/dL (ref 8.4–10.4)
Chloride: 110 mEq/L — ABNORMAL HIGH (ref 98–107)
Creatinine: 0.7 mg/dL (ref 0.6–1.1)

## 2013-01-15 ENCOUNTER — Ambulatory Visit (INDEPENDENT_AMBULATORY_CARE_PROVIDER_SITE_OTHER): Payer: Federal, State, Local not specified - PPO | Admitting: Internal Medicine

## 2013-01-15 ENCOUNTER — Encounter: Payer: Self-pay | Admitting: Internal Medicine

## 2013-01-15 VITALS — BP 110/78 | HR 77 | Temp 98.2°F | Ht <= 58 in | Wt 156.0 lb

## 2013-01-15 DIAGNOSIS — E663 Overweight: Secondary | ICD-10-CM

## 2013-01-15 DIAGNOSIS — E785 Hyperlipidemia, unspecified: Secondary | ICD-10-CM

## 2013-01-15 DIAGNOSIS — C50919 Malignant neoplasm of unspecified site of unspecified female breast: Secondary | ICD-10-CM

## 2013-01-15 DIAGNOSIS — Z23 Encounter for immunization: Secondary | ICD-10-CM

## 2013-01-15 DIAGNOSIS — C50911 Malignant neoplasm of unspecified site of right female breast: Secondary | ICD-10-CM

## 2013-01-15 DIAGNOSIS — Z0000 Encounter for general adult medical examination without abnormal findings: Secondary | ICD-10-CM

## 2013-01-15 NOTE — Assessment & Plan Note (Signed)
Never on rx same, working on diet and exercise changes losng weight trend as reviewed The patient is asked to make an attempt to improve diet and exercise patterns to aid in medical management of this problem. Check lipids now to monitor same

## 2013-01-15 NOTE — Progress Notes (Signed)
Subjective:    Patient ID: Nichole [name], female    DOB: 11-13-1950, 62 y.o.   MRN: [id number]  HPI  New patient to me and our division, here to establish with primary care provider Also follows with heme on, gynecology and GI locally Originally from [place], has lived also in [place], [place] and [place] -travels between these places frequently  patient is here today for annual physical. Patient feels well and has no complaints.  Also reviewed chronic medical issues: Breast cancer, November 2012. Status post lumpectomy, chemotherapy 11/13/2011. Remains on tamoxifen therapy. Follows locally with oncology for same.  On SNRI since 03/2012 to help with hot flash symptoms, uncertain of any benefit  History of dyslipidemia. Denies prior treatment medically for same. Takes natural supplement and working on diet - no dairy, no gluten and no fried foods - low glycemic index diet  Past Medical History  Diagnosis Date  . Syncope     2005, never recurred  . Hepatic steatosis   . Cystocele   . Hyperlipidemia   . Breast cancer NOV/2012    right lumpectomy  . Family history of colon cancer    Family History  Problem Relation Age of Onset  . Colon cancer Mother     dx postpartum  . Diabetes Father   . Breast cancer Cousin 51  . Diabetes Paternal Grandmother   . Breast cancer Maternal Aunt   . Congestive Heart Failure Mother   . Alzheimer's disease Mother    History  Substance Use Topics  . Smoking status: Former Smoker    Quit date: 05/23/1992  . Smokeless tobacco: Never Used  . Alcohol Use: No    Review of Systems Constitutional: Negative for fever or weight change.  Respiratory: Negative for cough and shortness of breath.   Cardiovascular: Negative for chest pain or palpitations.  Gastrointestinal: Negative for abdominal pain, no bowel changes.  Musculoskeletal: Negative for gait problem or joint swelling.  Skin: Negative for rash.  Neurological: Negative for dizziness  or headache.  No other specific complaints in a complete review of systems (except as listed in HPI above).     Objective:   Physical Exam BP 110/78  Pulse 77  Temp(Src) 98.2 F (36.8 C) (Oral)  Ht 4\' 10"  (1.473 m)  Wt 156 lb (70.761 kg)  BMI 32.61 kg/m2  SpO2 97% Wt Readings from Last 3 Encounters:  01/15/13 156 lb (70.761 kg)  08/03/12 162 lb 6.4 oz (73.664 kg)  06/02/12 157 lb 14.4 oz (71.623 kg)   Constitutional: She is overweight, but appears well-developed and well-nourished. No distress.  HENT: Head: Normocephalic and atraumatic. Ears: B TMs ok, no erythema or effusion; Nose: Nose normal. Mouth/Throat: Oropharynx is clear and moist. No oropharyngeal exudate.  Eyes: Conjunctivae and EOM are normal. Pupils are equal, round, and reactive to light. No scleral icterus.  Neck: Normal range of motion. Neck supple. No JVD present. No thyromegaly present.  Cardiovascular: Normal rate, regular rhythm and normal heart sounds.  No murmur heard. No BLE edema. Pulmonary/Chest: Effort normal and breath sounds normal. No respiratory distress. She has no wheezes.  Abdominal: Soft. Bowel sounds are normal. She exhibits no distension. There is no tenderness. no masses Musculoskeletal: Normal range of motion, no joint effusions. No gross deformities Neurological: She is alert and oriented to person, place, and time. No cranial nerve deficit. Coordination, balance, strength, speech and gait are normal.  Skin: Skin is warm and dry. No rash noted. No erythema.  Psychiatric: She  has a normal mood and affect. Her behavior is normal. Judgment and thought content normal.   Lab Results  Component Value Date   WBC 6.2 01/08/2013   HGB 13.1 01/08/2013   HCT 38.1 01/08/2013   PLT 223 01/08/2013   GLUCOSE 86 01/08/2013   ALT 18 01/08/2013   AST 18 01/08/2013   NA 142 01/08/2013   K 4.1 01/08/2013   CL 110* 01/08/2013   CREATININE 0.7 01/08/2013   BUN 16.1 01/08/2013   CO2 23 01/08/2013   TSH 2.449 05/23/2012         Assessment & Plan:   CPX/v70.0 - Patient has been counseled on age-appropriate routine health concerns for screening and prevention. These are reviewed and up-to-date. Immunizations are up-to-date or declined. Labs ordered and reviewed.  See problem list. Medications and labs reviewed today.

## 2013-01-15 NOTE — Patient Instructions (Signed)
It was good to see you today. We have reviewed your prior records including labs and tests today Health Maintenance reviewed - tetanus (Tdap) updated today, consider the shingles (varicella vaccine) as discussed -all other recommended immunizations and age-appropriate screenings are up-to-date. Test(s) ordered today -cholesterol to be checked tomorrow. Your results will be released to MyChart (or called to you) after review, usually within 72hours after test completion. If any changes need to be made, you will be notified at that same time. Medications reviewed and updated, no changes recommended at this time. Please schedule followup in annually (every 12 mo) for physical and review, call sooner if problems.   Health Maintenance, Females A healthy lifestyle and preventative care can promote health and wellness.  Maintain regular health, dental, and eye exams.  Eat a healthy diet. Foods like vegetables, fruits, whole grains, low-fat dairy products, and lean protein foods contain the nutrients you need without too many calories. Decrease your intake of foods high in solid fats, added sugars, and salt. Get information about a proper diet from your caregiver, if necessary.  Regular physical exercise is one of the most important things you can do for your health. Most adults should get at least 150 minutes of moderate-intensity exercise (any activity that increases your heart rate and causes you to sweat) each week. In addition, most adults need muscle-strengthening exercises on 2 or more days a week.   Maintain a healthy weight. The body mass index (BMI) is a screening tool to identify possible weight problems. It provides an estimate of body fat based on height and weight. Your caregiver can help determine your BMI, and can help you achieve or maintain a healthy weight. For adults 20 years and older:  A BMI below 18.5 is considered underweight.  A BMI of 18.5 to 24.9 is normal.  A BMI of 25 to  29.9 is considered overweight.  A BMI of 30 and above is considered obese.  Maintain normal blood lipids and cholesterol by exercising and minimizing your intake of saturated fat. Eat a balanced diet with plenty of fruits and vegetables. Blood tests for lipids and cholesterol should begin at age 52 and be repeated every 5 years. If your lipid or cholesterol levels are high, you are over 50, or you are a high risk for heart disease, you may need your cholesterol levels checked more frequently.Ongoing high lipid and cholesterol levels should be treated with medicines if diet and exercise are not effective.  If you smoke, find out from your caregiver how to quit. If you do not use tobacco, do not start.  If you are pregnant, do not drink alcohol. If you are breastfeeding, be very cautious about drinking alcohol. If you are not pregnant and choose to drink alcohol, do not exceed 1 drink per day. One drink is considered to be 12 ounces (355 mL) of beer, 5 ounces (148 mL) of wine, or 1.5 ounces (44 mL) of liquor.  Avoid use of street drugs. Do not share needles with anyone. Ask for help if you need support or instructions about stopping the use of drugs.  High blood pressure causes heart disease and increases the risk of stroke. Blood pressure should be checked at least every 1 to 2 years. Ongoing high blood pressure should be treated with medicines, if weight loss and exercise are not effective.  If you are 74 to 64 years old, ask your caregiver if you should take aspirin to prevent strokes.  Diabetes screening involves taking  a blood sample to check your fasting blood sugar level. This should be done once every 3 years, after age 28, if you are within normal weight and without risk factors for diabetes. Testing should be considered at a younger age or be carried out more frequently if you are overweight and have at least 1 risk factor for diabetes.  Breast cancer screening is essential preventative  care for women. You should practice "breast self-awareness." This means understanding the normal appearance and feel of your breasts and may include breast self-examination. Any changes detected, no matter how small, should be reported to a caregiver. Women in their 77s and 30s should have a clinical breast exam (CBE) by a caregiver as part of a regular health exam every 1 to 3 years. After age 79, women should have a CBE every year. Starting at age 34, women should consider having a mammogram (breast X-ray) every year. Women who have a family history of breast cancer should talk to their caregiver about genetic screening. Women at a high risk of breast cancer should talk to their caregiver about having an MRI and a mammogram every year.  The Pap test is a screening test for cervical cancer. Women should have a Pap test starting at age 53. Between ages 9 and 79, Pap tests should be repeated every 2 years. Beginning at age 67, you should have a Pap test every 3 years as long as the past 3 Pap tests have been normal. If you had a hysterectomy for a problem that was not cancer or a condition that could lead to cancer, then you no longer need Pap tests. If you are between ages 17 and 30, and you have had normal Pap tests going back 10 years, you no longer need Pap tests. If you have had past treatment for cervical cancer or a condition that could lead to cancer, you need Pap tests and screening for cancer for at least 20 years after your treatment. If Pap tests have been discontinued, risk factors (such as a new sexual partner) need to be reassessed to determine if screening should be resumed. Some women have medical problems that increase the chance of getting cervical cancer. In these cases, your caregiver may recommend more frequent screening and Pap tests.  The human papillomavirus (HPV) test is an additional test that may be used for cervical cancer screening. The HPV test looks for the virus that can cause  the cell changes on the cervix. The cells collected during the Pap test can be tested for HPV. The HPV test could be used to screen women aged 70 years and older, and should be used in women of any age who have unclear Pap test results. After the age of 89, women should have HPV testing at the same frequency as a Pap test.  Colorectal cancer can be detected and often prevented. Most routine colorectal cancer screening begins at the age of 28 and continues through age 68. However, your caregiver may recommend screening at an earlier age if you have risk factors for colon cancer. On a yearly basis, your caregiver may provide home test kits to check for hidden blood in the stool. Use of a small camera at the end of a tube, to directly examine the colon (sigmoidoscopy or colonoscopy), can detect the earliest forms of colorectal cancer. Talk to your caregiver about this at age 41, when routine screening begins. Direct examination of the colon should be repeated every 5 to 10 years through  age 59, unless early forms of pre-cancerous polyps or small growths are found.  Hepatitis C blood testing is recommended for all people born from [birth year] through 1967 and any individual with known risks for hepatitis C.  Practice safe sex. Use condoms and avoid high-risk sexual practices to reduce the spread of sexually transmitted infections (STIs). Sexually active women aged 75 and younger should be checked for Chlamydia, which is a common sexually transmitted infection. Older women with new or multiple partners should also be tested for Chlamydia. Testing for other STIs is recommended if you are sexually active and at increased risk.  Osteoporosis is a disease in which the bones lose minerals and strength with aging. This can result in serious bone fractures. The risk of osteoporosis can be identified using a bone density scan. Women ages 7 and over and women at risk for fractures or osteoporosis should discuss screening with  their caregivers. Ask your caregiver whether you should be taking a calcium supplement or vitamin D to reduce the rate of osteoporosis.  Menopause can be associated with physical symptoms and risks. Hormone replacement therapy is available to decrease symptoms and risks. You should talk to your caregiver about whether hormone replacement therapy is right for you.  Use sunscreen with a sun protection factor (SPF) of 30 or greater. Apply sunscreen liberally and repeatedly throughout the day. You should seek shade when your shadow is shorter than you. Protect yourself by wearing long sleeves, pants, a wide-brimmed hat, and sunglasses year round, whenever you are outdoors.  Notify your caregiver of new moles or changes in moles, especially if there is a change in shape or color. Also notify your caregiver if a mole is larger than the size of a pencil eraser.  Stay current with your immunizations. Document Released: 03/15/2011 Document Revised: 11/22/2011 Document Reviewed: 03/15/2011 [facility] Patient Information 2013 [place], [place]. Exercise to Stay Healthy Exercise helps you become and stay healthy. EXERCISE IDEAS AND TIPS Choose exercises that:  You enjoy.  Fit into your day. You do not need to exercise really hard to be healthy. You can do exercises at a slow or medium level and stay healthy. You can:  Stretch before and after working out.  Try yoga, Pilates, or tai chi.  Lift weights.  Walk fast, swim, jog, run, climb stairs, bicycle, dance, or rollerskate.  Take aerobic classes. Exercises that burn about 150 calories:  Running 1  miles in 15 minutes.  Playing volleyball for 45 to 60 minutes.  Washing and waxing a car for 45 to 60 minutes.  Playing touch football for 45 minutes.  Walking 1  miles in 35 minutes.  Pushing a stroller 1  miles in 30 minutes.  Playing basketball for 30 minutes.  Raking leaves for 30 minutes.  Bicycling 5 miles in 30 minutes.  Walking  2 miles in 30 minutes.  Dancing for 30 minutes.  Shoveling snow for 15 minutes.  Swimming laps for 20 minutes.  Walking up stairs for 15 minutes.  Bicycling 4 miles in 15 minutes.  Gardening for 30 to 45 minutes.  Jumping rope for 15 minutes.  Washing windows or floors for 45 to 60 minutes. Document Released: 10/02/2010 Document Revised: 11/22/2011 Document Reviewed: 10/02/2010 [facility] Patient Information 2013 [place], [place].

## 2013-01-15 NOTE — Assessment & Plan Note (Signed)
Diagnosed November 2012 - lumpectomy, chemotherapy and ongoing tamoxifen Interval history reviewed Continue same therapy, [place] Consider wean off SSRI as no benefit for hot flash symptoms, patient will discuss with her prescribing provider

## 2013-01-16 ENCOUNTER — Ambulatory Visit (INDEPENDENT_AMBULATORY_CARE_PROVIDER_SITE_OTHER): Payer: Federal, State, Local not specified - PPO

## 2013-01-16 ENCOUNTER — Telehealth: Payer: Self-pay | Admitting: Oncology

## 2013-01-16 ENCOUNTER — Ambulatory Visit (HOSPITAL_BASED_OUTPATIENT_CLINIC_OR_DEPARTMENT_OTHER): Payer: Federal, State, Local not specified - PPO | Admitting: Oncology

## 2013-01-16 VITALS — BP 128/81 | HR 71 | Temp 97.7°F | Resp 20 | Ht <= 58 in | Wt 155.2 lb

## 2013-01-16 DIAGNOSIS — C50911 Malignant neoplasm of unspecified site of right female breast: Secondary | ICD-10-CM

## 2013-01-16 DIAGNOSIS — E785 Hyperlipidemia, unspecified: Secondary | ICD-10-CM

## 2013-01-16 DIAGNOSIS — C50919 Malignant neoplasm of unspecified site of unspecified female breast: Secondary | ICD-10-CM

## 2013-01-16 LAB — LIPID PANEL
HDL: 47.6 mg/dL (ref >39.00)
Total CHOL/HDL Ratio: 5
VLDL: 56.6 mg/dL — ABNORMAL HIGH (ref 0.0–40.0)

## 2013-01-16 NOTE — Telephone Encounter (Signed)
, °

## 2013-01-16 NOTE — Progress Notes (Signed)
ID: Nichole [name]   DOB: Oct 19, 1950  MR#: [id number]  JXB#:[id number]  PCP: Rene Paci, MD GYN: Dr Reynaldo Minium SU:  OTHER MD: Dr. Jeani Hawking   HISTORY OF PRESENT ILLNESS: The patient herself noted a mass in her right breast and brought it to her physician's attention. She was living in [place] at that time. An ultrasound was done, showing a 1.7 cm spiculated nodule in the right breast with associated calcifications. There were no other findings of concern.  Lumpectomy was performed, with reportedly negative margins (I do not have those records). In Dr. Glori Luis note 11/04/2011 this tumor is described as triple positive.  The patient moved to [facility] for further treatment, and sentinel lymph node sampling in December of 2012 was negative. The original tissue obtained in [place] was reviewed, showing a 1.6 cm, grade 3 invasive ductal carcinoma. and the prognostic panel obtained in [place] showed the tumor to be estrogen receptor positive, progesterone receptor negative, and HER-2 negative in the invasive tumor (it was positive in the in situ tumor; SCT 12-30 at [facility]). (A note from that report states that HER-2 by [facility] was performed at [facility] and was reportedly positive in the invasive carcinoma. That report is not available to me today.)  The patient completed adjuvant radiation and was reassessed with an [facility] panel, which showed the tumor to be HER-2 negative, with an MIB-1 of 42%, again estrogen receptor positive and progesterone receptor negative. This panel predicted a 43% risk of recurrence at 8 years. The patient also had an Oncotype DX sent. This was again ER positive, PR negative, and HER-2 negative, and predicted a 34% risk of distant recurrence at 10 years if the patient's only adjuvant systemic treatment was tamoxifen for 5 years. With these numbers Dr. Levora Angel discussed adjuvant chemotherapy with the patient, and proposed  carboplatin, docetaxel, and Herceptin, which was started on February 2013. On a note from 11/12/2011, Dr. Levora Angel states "even though the HER-2-neu is negative, I am still opting for Herceptin based on the aggressiveness of the tumor." The chemotherapy was completed in June 2013. Herceptin was continued, the plan being to complete one year. The patient's subsequent history is as detailed below.  INTERVAL HISTORY: Nichole [name] returns today with her husband Nichole [name] for followup of her breast cancer. They have spent quite a bit of time in [place] where they have a large thousand where she has a lot of family. They will be going back there later this month, but returning in August and staying through the end of the  REVIEW OF SYSTEMS: She complains of no side effects from tamoxifen other than mild hot flashes. She feels a little forgetful at times. She has a little "bump" on her abdomen she wants me to see. Her rib cage seems asymmetric to her. Otherwise a detailed review of systems today was entirely negative.   PAST MEDICAL HISTORY: Past Medical History  Diagnosis Date  . Syncope     2005, never recurred  . Hepatic steatosis   . Cystocele   . Hyperlipidemia   . Breast cancer NOV/2012    right lumpectomy  . Family history of colon cancer     PAST SURGICAL HISTORY: Past Surgical History  Procedure Laterality Date  . Breast lumpectomy  DEC/2012    RIGHT  BREAST  . Port a cath placement      FAMILY HISTORY Family History  Problem Relation Age of Onset  . Colon cancer Mother  dx postpartum  . Diabetes Father   . Breast cancer Cousin 51  . Diabetes Paternal Grandmother   . Breast cancer Maternal Aunt   . Congestive Heart Failure Mother   . Alzheimer's disease Mother    The patient's father died at the age of 34. The patient's mother died at the age of 30 after a fall, in the setting of Alzheimer's disease. The patient has no siblings. There are some second-degree relatives with breast  cancer. She has been tested for the BRCA 1 and 2 mutations, and was found to be negative.  GYNECOLOGIC HISTORY: Menarche age 48. First live birth age 2. She is GX P4. Last menstrual period 2006. Status post hormone replacement, discontinued November 2012.  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 Her husband, Nichole [name], recently retired from the [employer]. This is a second marriage for both of them. [name]'s 4 children are Nichole [name], 41, who lives in [place] and works as a Veterinary surgeon; Nichole [name], who works as a Personnel officer for an Public relations account executive firm in [place]; Nichole [name], who works as an Art gallery manager in [place]; and the youngest, Nichole [name], 28, who works as a Naval architect in [place]. The patient has 4 grandchildren and one on the way. She is a Air traffic controller.  ADVANCED DIRECTIVES: not in place  HEALTH MAINTENANCE: History  Substance Use Topics  . Smoking status: Former Smoker    Quit date: 05/23/1992  . Smokeless tobacco: Never Used  . Alcohol Use: No     Colonoscopy:  PAP:  Bone density:  Lipid panel:  No Known Allergies  Current Outpatient Prescriptions  Medication Sig Dispense Refill  . NON FORMULARY Vitafibra Take 1 by mouth day      . NON FORMULARY Mega Probiotic take 1 by mouth daily      . NON FORMULARY Linaza Oil take daily      . tamoxifen (NOLVADEX) 20 MG tablet Take 1 tablet (20 mg total) by mouth daily.  90 tablet  3  . venlafaxine (EFFEXOR) 37.5 MG tablet Take 1 tablet (37.5 mg total) by mouth 2 (two) times daily.  180 tablet  3   No current facility-administered medications for this visit.    OBJECTIVE: Middle-aged white woman who appears well Filed Vitals:   01/16/13 0903  BP: 128/81  Pulse: 71  Temp: 97.7 F (36.5 C)  Resp: 20     Body mass index is 32.45 kg/(m^2).    ECOG FS: 1 Filed Weights   01/16/13 0903  Weight:  155 lb 3.2 oz (70.398 kg)   Sclerae unicteric Oropharynx clear No cervical or supraclavicular adenopathy Lungs no rales or rhonchi Heart regular rate and rhythm Abdomen soft, nontender, with positive bowel sounds; there is what feels like a lipoma measuring 2 x 1 cm in the left upper quadrant of the abdomen, approximately 2 inches below the rib cage. This requires only followup. MSK no focal spinal tenderness, no significant asymmetry between the right and left rib cage areas, no tenderness to palpation No peripheral edema  Neuro: nonfocal, alert and oriented x3 Breasts: The right breast is status post lumpectomy and radiation; there is no evidence of local recurrence; the right axilla is benign. The left breast is unremarkable.    LAB RESULTS: Lab Results  Component Value Date   WBC 6.2 01/08/2013   NEUTROABS 3.1 01/08/2013   HGB 13.1 01/08/2013   HCT 38.1  01/08/2013   MCV 88.9 01/08/2013   PLT 223 01/08/2013      Chemistry      Component Value Date/Time   NA 142 01/08/2013 0821   K 4.1 01/08/2013 0821   CL 110* 01/08/2013 0821   CO2 23 01/08/2013 0821   BUN 16.1 01/08/2013 0821   CREATININE 0.7 01/08/2013 0821      Component Value Date/Time   CALCIUM 8.4 01/08/2013 0821   ALKPHOS 73 01/08/2013 0821   AST 18 01/08/2013 0821   ALT 18 01/08/2013 0821   BILITOT 0.32 01/08/2013 0821       Lab Results  Component Value Date   LABCA2 35 08/03/2012    STUDIES: Review of outside studies shows bone scan 04/29/2012 showing uptake into adjacent ribs anterolaterally, likely ribs 5 and 6, felt to be posttraumatic (likely secondary to her recent cough paroxysms. Her most recent echocardiogram  12/29/2011 showed an ejection fraction in the 65-70% range. Bilateral breast MRIs obtained at December 2012 showed heterogeneously dense breasts with distortion at the side of the prior surgery. There were no other suspicious areas, other than a lymph node in the right axilla which may or may not have shown  focal cortical thickening. (The patient subsequently underwent sentinel lymph node sampling.) Mammography from what a mild April of 2012 was unremarkable. Abdominal ultrasonography in [place]  2012 showed hepatic steatosis. Ultrasound in [place] April 2012 showed a simple cystocele. The uterus and ovaries were unremarkable.  ASSESSMENT: 62 y.o. BRCA negative [place] woman recently moved to [facility], s/p Right lumpectomy December 2012 for a pT1c pN0, stage 1A invasive ductal carcinoma, grade 3, estrogen receptor positive, progesterone receptor negative, with a Ki-67 of 42%  (1) Oncotype DX recurrence score 54% predicting a 10-year risk of distant recurrence of 34% if the only adjuvant systemic treatment is tamoxifen x 5 years  (1) s/p adjuvant radiation completed February 2013  (2) s/p six cycles of carboplatin/ docetaxel/ trastuzumab completed June 2012  (3) started tamoxifen 05/14/2012  PLAN:  Nichole [name] is doing well with the tamoxifen and the plan is to continue that at least 2 years and then reassess, possibly continuing to 10 years. She will have mammography at the [facility] Center in September and see [place] again in November of this year  I don't have a simple explanation why her albumin should be a bit lower chloride a bit high, but she is on multiple supplements I suggested she stop all of them and see if things don't recover naturally. I refilled her venlafaxine and tamoxifen today.  She knows to call for any problems that may develop before the next visit    MAGRINAT,GUSTAV C    01/16/2013

## 2013-01-17 ENCOUNTER — Encounter: Payer: Self-pay | Admitting: Internal Medicine

## 2013-01-17 LAB — LDL CHOLESTEROL, DIRECT: Direct LDL: 127 mg/dL

## 2013-05-22 ENCOUNTER — Encounter: Payer: Self-pay | Admitting: Physician Assistant

## 2013-05-23 ENCOUNTER — Other Ambulatory Visit: Payer: Self-pay | Admitting: Physician Assistant

## 2013-05-23 ENCOUNTER — Telehealth: Payer: Self-pay | Admitting: *Deleted

## 2013-05-23 DIAGNOSIS — R200 Anesthesia of skin: Secondary | ICD-10-CM

## 2013-05-23 NOTE — Telephone Encounter (Signed)
Called patient to inform her the numbness could be from her Taxol treatments but the other symptoms are not from the tamoxifen, herceptin or taxol.  Nichole [name] says she also has numbness, pain and a pulling sensation to the left hand side of her back.  This started about two months ago.    Not sure if this is muscle or bone.  Bone scan was May 26, 2012.  She does want to have a referral to rule out carpel tunnel syndrome.  Will notify providers of new symptoms reported.  Patient will call [name] to request an appointment.

## 2013-05-24 ENCOUNTER — Encounter: Payer: Self-pay | Admitting: Internal Medicine

## 2013-05-24 ENCOUNTER — Telehealth: Payer: Self-pay | Admitting: *Deleted

## 2013-05-24 ENCOUNTER — Encounter: Payer: Self-pay | Admitting: *Deleted

## 2013-05-24 ENCOUNTER — Ambulatory Visit (INDEPENDENT_AMBULATORY_CARE_PROVIDER_SITE_OTHER): Payer: Federal, State, Local not specified - PPO | Admitting: Internal Medicine

## 2013-05-24 VITALS — BP 118/74 | HR 92 | Temp 98.4°F | Wt 160.8 lb

## 2013-05-24 DIAGNOSIS — M62830 Muscle spasm of back: Secondary | ICD-10-CM

## 2013-05-24 DIAGNOSIS — R209 Unspecified disturbances of skin sensation: Secondary | ICD-10-CM

## 2013-05-24 DIAGNOSIS — R200 Anesthesia of skin: Secondary | ICD-10-CM

## 2013-05-24 DIAGNOSIS — M5380 Other specified dorsopathies, site unspecified: Secondary | ICD-10-CM

## 2013-05-24 DIAGNOSIS — C50919 Malignant neoplasm of unspecified site of unspecified female breast: Secondary | ICD-10-CM

## 2013-05-24 MED ORDER — CYCLOBENZAPRINE HCL 5 MG PO TABS: 5.0000 mg | ORAL_TABLET | Freq: Three times a day (TID) | ORAL | Status: DC | PRN

## 2013-05-24 NOTE — Assessment & Plan Note (Signed)
Diagnosed November 2012 - lumpectomy, chemotherapy and ongoing tamoxifen Interval history reviewed Continue same therapy, followup with oncology here and [place] as planned Self wean off SSRI summer 2014 as no benefit for hot flash symptoms

## 2013-05-24 NOTE — Telephone Encounter (Signed)
Lm gv appt for Dr. Teressa Senter for 05/30/13@ 9:30am along w/ address and phone number,....td

## 2013-05-24 NOTE — Progress Notes (Signed)
Subjective:    Patient ID: Nichole [name], female    DOB: [birth year], 62 y.o.   MRN: [id number]  Arm Pain  The incident occurred more than 1 week ago. There was no injury mechanism. The pain is present in the left hand and left wrist. The quality of the pain is described as aching (numbness). The pain radiates to the left arm. The pain is moderate. The pain has been fluctuating since the incident. Associated symptoms include numbness. Pertinent negatives include no chest pain or tingling. Muscle weakness: ? The symptoms are aggravated by movement and lifting. She has tried rest for the symptoms. The treatment provided no relief.    Also reviewed chronic medical issues: Breast cancer, November 2012. Status post lumpectomy, chemotherapy 11/13/2011. Remains on tamoxifen therapy. Follows locally with oncology for same.  trial SNRI stated 03/2012 to help with hot flash symptoms, stopped same indep as uncertain of any benefit  History of dyslipidemia. Denies prior treatment medically for same. Takes natural supplement and working on diet - no dairy, no gluten and no fried foods - low glycemic index diet  Past Medical History  Diagnosis Date  . Syncope     2005, never recurred  . Hepatic steatosis   . Cystocele   . Hyperlipidemia   . Breast cancer NOV/2012    right lumpectomy  . Family history of colon cancer     Review of Systems  Cardiovascular: Negative for chest pain.  Musculoskeletal: Positive for back pain (spasm under L shoulder blade). Negative for myalgias, joint swelling, arthralgias and gait problem.  Neurological: Positive for numbness. Negative for tingling.       Objective:   Physical Exam BP 118/74  Pulse 92  Temp(Src) 98.4 F (36.9 C) (Oral)  Wt 160 lb 12.8 oz (72.938 kg)  BMI 33.62 kg/m2  SpO2 96% Wt Readings from Last 3 Encounters:  05/24/13 160 lb 12.8 oz (72.938 kg)  01/16/13 155 lb 3.2 oz (70.398 kg)  01/15/13 156 lb (70.761 kg)   Constitutional: She is  overweight, but appears well-developed and well-nourished. No distress.  Neck: Normal range of motion. Neck supple. No JVD present. No thyromegaly present.  Cardiovascular: Normal rate, regular rhythm and normal heart sounds.  No murmur heard. No BLE edema. Pulmonary/Chest: Effort normal and breath sounds normal. No respiratory distress. She has no wheezes.  Musculoskeletal: Normal range of motion L wrist and hand, no joint effusions. No gross deformities. Grip strength symmetric - Myofascial spasm with pain to palpation under L scapula - shoulder and back with FROM, nontender to palp Neurological: She is alert and oriented to person, place, and time. No cranial nerve deficit. Coordination, balance, strength, speech and gait are normal.  Skin: Skin is warm and dry. No rash noted. No erythema.  Psychiatric: She has a normal mood and affect. Her behavior is normal. Judgment and thought content normal.   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 Medications and labs reviewed today.

## 2013-05-24 NOTE — Patient Instructions (Addendum)
It was good to see you today. We have reviewed your prior records including labs and tests today For your hand numbness, wear the provided wrist splint at bedtime while you sleep - ok to remove in AM we'll make referral for nerve conduction test to look for carpal tunnel syndrome causing pain and numbness . Our office will contact you regarding appointment(s) once made. For your back spasm, use muscle relaxer as needed to treat pain Your prescription(s) have been submitted to your pharmacy. Please take as directed and contact our office if you believe you are having problem(s) with the medication(s). Carpal Tunnel Syndrome Fact Sheet You are working at your desk, trying to ignore the tingling or numbness you have had for months in your hand and wrist. Suddenly, a sharp, piercing pain shoots through the wrist and up your arm. Just a passing cramp? More likely you have carpal tunnel syndrome. This is a painful progressive condition caused by compression of a key nerve in the wrist. WHAT IS CARPAL TUNNEL SYNDROME? Carpal tunnel syndrome occurs when the nerve that runs from the forearm into the hand (median nerve), becomes pressed or squeezed at the wrist. The median nerve controls sensations to the palm side of the thumb and fingers (although not the little finger). That nerve also controls impulses to some small muscles in the hand, that allow the fingers and thumb to move. The narrow, rigid passageway of ligament and bones at the base of the hand (carpal tunnel) houses the median nerve and tendons. Sometimes, thickening from irritated tendons or other swelling narrows the tunnel. The narrowing causes the median nerve to be compressed. The result may be pain, weakness, or numbness in the hand and wrist that moves (radiates) up the arm. Although painful sensations may indicate other conditions, carpal tunnel syndrome is the most common and widely known of the entrapment neuropathies. These are conditions in  which the body's peripheral nerves are compressed or traumatized. SYMPTOMS   Symptoms usually start gradually, with:   Frequent burning.   Tingling.   Itching numbness in the palm of the hand and the fingers.  This is especially true in the thumb and the index and middle fingers.  Some carpal tunnel sufferers say their fingers feel useless and swollen. They may feel this way even though little or no swelling is apparent.   The symptoms often first appear in one or both hands during the night, since many people sleep with flexed wrists. A person with carpal tunnel syndrome may wake up feeling the need to shake out the hand or wrist.   As symptoms worsen, people might feel tingling during the day.   Decreased grip strength may make it difficult to:   Form a fist.   Grasp small objects.   Perform other manual tasks.  In chronic and/or untreated cases, the muscles at the base of the thumb may waste away.  Some people are unable to tell between hot and cold by touch.  CAUSES   Carpal tunnel syndrome is often the result of a combination of factors that increase pressure on the median nerve and tendons in the carpal tunnel. It is usually not a problem with the nerve itself.   Most likely the disorder is due to a congenital predisposition. This means that the carpal tunnel is simply smaller in some people than in others.   Other factors include:   Trauma or injury to the wrist that cause swelling, such as sprain or fracture.  Overactivity of the pituitary gland.   Hypothyroidism.   Rheumatoid arthritis.   Mechanical problems in the wrist joint.   Work stress.   Repeated use of vibrating hand tools.   Fluid retention during pregnancy or menopause.   The development of a cyst or tumor in the canal.   In some cases, no cause can be identified.   There is little clinical data to prove whether repetitive and forceful movements of the hand and wrist during work or leisure  activities can cause carpal tunnel syndrome. Repeated motions performed in the course of normal work or other daily activities can result in repetitive motion disorders such as bursitis and tendonitis. Writer's cramp, a condition in which a lack of fine motor skill coordination and ache and pressure in the fingers, wrist, or forearm is brought on by repetitive activity, is not a symptom of carpal tunnel syndrome.  WHO IS AT RISK OF DEVELOPING CARPAL TUNNEL SYNDROME?  Women are three times more likely than men to develop carpal tunnel syndrome. This may be the result of the carpal tunnel being smaller in women.   The dominant hand is usually affected first and produces the most severe pain.   Diabetes or other metabolic disorders that directly affect the body's nerves make people more susceptible to compression.   Carpal tunnel syndrome usually occurs only in adults.   The risk of developing carpal tunnel syndrome is not confined to people in a single industry or job. However, it is especially common in those performing assembly line work, such as:   Set designer.   Sewing.   Finishing.   Cleaning.   Meat packing.  Carpal tunnel syndrome is three times more common among assemblers than among data-entry personnel. A 2001 study by the [facility] found heavy computer use (up to 7 hours a day) did not increase a person's risk of developing carpal tunnel syndrome.  During 1998, an estimated three of every 10,000 workers lost time from work because of carpal tunnel syndrome. Half of these workers missed more than 10 days of work. The average lifetime cost of carpal tunnel syndrome is estimated to be about $30,000 for each injured worker. This includes medical bills and lost time from work  DIAGNOSIS   Early diagnosis and treatment are important to avoid permanent damage to the median nerve.   A physical examination of the hands, arms, shoulders, and neck can help determine if the patient's  complaints are related to daily activities or to an underlying disorder. An exam can rule out other painful conditions that mimic carpal tunnel syndrome.   The wrist is examined for:   Tenderness.   Swelling.   Warmth.   Discoloration.   Each finger should be tested for sensation. The muscles at the base of the hand should be examined for strength and signs of shrinking (atrophy).   Routine laboratory tests and X-rays can reveal:   Diabetes.   Arthritis.   Fractures.   Physicians can use specific tests to try to produce the symptoms of carpal tunnel syndrome.   In the Tinel test, the caregiver taps or presses on the median nerve in the patient's wrist. The test is positive when tingling in the fingers or a shock-like sensation occurs.   The Phalen or wrist-flexion test involves having the patient hold their forearms upright by pointing the fingers down and pressing the backs of the hands together. The presence of carpal tunnel syndrome is suggested if one or more symptoms is  felt in the fingers within 1 minute.   Caregivers may also ask patients to try to make a movement that brings on symptoms.   Often it is necessary to confirm the diagnosis by use of electrodiagnostic tests.   In a nerve conduction study, electrodes are placed on the hand and wrist. Small electric shocks are applied and the speed with which nerves transmit impulses is measured.   In electromyography, a fine needle is inserted into a muscle. The electrical activity is viewed on a screen. The activity can determine the severity of damage to the median nerve.   Ultrasound imaging can show impaired movement of the median nerve.  TREATMENT  Treatments for carpal tunnel syndrome should begin as early as possible. It should be done under a caregiver's direction. Underlying causes such as diabetes or arthritis should be treated first. Initial treatment generally involves:  Resting the affected hand and wrist for at  least 2 weeks.   Avoiding activities that may worsen symptoms.   Immobilizing (keeping from moving) the wrist in a splint.  These things are all done to avoid further damage from twisting or bending. If there is inflammation, applying cool packs can help reduce swelling. NON-SURGICAL  Drugs - In special circumstances, different drugs can ease the pain and swelling.   Nonsteroidal anti-inflammatory drugs, such as aspirin, and other non-prescription pain relievers, may ease symptoms.   Orally administered diuretics (water pills) can decrease swelling.   Corticosteroids such as prednisone or lidocaine can be injected directly into the wrist or taken by mouth. This can relieve pressure on the median nerve. It may provide immediate, but temporary relief to persons with mild or intermittent symptoms. (Caution: persons with diabetes and those who may be predisposed to diabetes should note that long term use of corticosteroids can make it difficult to regulate insulin levels. Corticosterioids should not be taken without a prescription.)   Additionally, some studies show that vitamin B6 (pyridoxine) supplements may ease the symptoms of carpal tunnel syndrome.   Exercise - Stretching and strengthening exercises can be helpful in people whose symptoms have decreased. These exercises may be supervised by a physical therapist that is trained to use exercises to treat physical impairments. The exercises can also be supervised by an occupational therapist. This is someone who is trained in evaluating people with physical impairments. They will help them build skills to improve their health and well-being.   Alternative therapies - Acupuncture and chiropractic care have helped some patients. Their effectiveness remains unproved. An exception is yoga, which has been shown to reduce pain and improve grip strength among patients with carpal tunnel syndrome.  SURGERY Carpal tunnel release is a surgical procedure  commonly used. It is generally recommended if symptoms last for 6 months or more. This surgery involves cutting the band of tissue around the wrist to reduce pressure on the median nerve. Surgery is done under local anesthesia. It does not require an overnight hospital stay. Many patients require surgery on both hands. The following are types of carpal tunnel release surgery:  Open release surgery. This is the traditional procedure used to correct carpal tunnel syndrome. It consists of making a cut (incision) up to 2 inches in the wrist. Then, the surgeon cuts the carpal ligament to enlarge the carpal tunnel. The procedure is generally done under local anesthesia on an outpatient basis. The only exception is if there are unusual medical considerations.   Endoscopic surgery may allow faster recovery and less postoperative discomfort than  traditional open release surgery. The surgeon makes two incisions (about 1/2" each) in the wrist and palm. The surgeon will:   Insert a camera attached to a tube.   Observe the tissue on a screen.   Cut the carpal ligament (the tissue that holds joints together).  This two-portal endoscopic surgery, generally performed under local anesthesia, is effective and minimizes scarring and scar tenderness. One-portal endoscopic surgery for carpal tunnel syndrome is also available. Although symptoms may be relieved immediately after surgery, full recovery from carpal tunnel surgery can take months. Some patients may have:  Infection.   Nerve damage.   Stiffness.   Pain at the scar.  Occasionally, the wrist loses strength because the carpal ligament is cut. Patients should undergo physical therapy after surgery to restore wrist strength. Some patients may need to adjust job duties or even change jobs after recovery from surgery. Recurrence of carpal tunnel syndrome following treatment is rare. The majority of patients recover completely. PREVENTION  At the workplace,  workers can:   Do on-the-job conditioning, perform stretching exercises.   Take frequent rest breaks.   Wear splints to keep wrists straight.   Use correct posture and wrist position.   Wearing finger-less gloves can help keep hands warm and flexible.   Design workstations, tools and tool handles, and tasks to enable the worker's wrist to maintain a natural position.   Rotate jobs among workers.   Employers can develop programs in ergonomics. This is the process of adapting workplace conditions and job demands to the capabilities of workers. However, research has not conclusively shown that these workplace changes prevent the occurrence of carpal tunnel syndrome.  WHAT RESEARCH IS BEING DONE? The [employer] of Neurological Disorders and Stroke (NINDS), a part of the [employer], is the federal government's leading supporter of research on carpal tunnel syndrome. Scientists are studying the order of events that occur with carpal tunnel syndrome. They do this to better understand, treat, and prevent this ailment.  WHAT IS PERCUTANEOUS BALLOON CARPAL TUNNEL-PLASTY? Percutaneous balloon carpal tunnel-plasty is an experimental technique that can ease carpal tunnel pain without cutting the carpal ligament. In this procedure, a 1/4 -inch cut is made at the base of the palm. The caregiver then inserts a balloon through a catheter under the carpal ligament and inflates the balloon to stretch the ligament and free the nerve. Patients in one small study of this procedure reported relief of symptoms with no complications after the surgery. Most of them were back to work within 2 weeks. This experimental technique is not yet widely available. FOR MORE INFORMATION American Academy of Orthopaedic Surgeons: www.aaos.org  Centers for Disease Control and Prevention (CDCP): FootballExhibition.com.br [employer] of Arthritis and Musculoskeletal and Skin Diseases (NIAMS):  www.niams.http://www.myers.net/ American Chronic Pain Association (ACPA): www.theacpa.org National Chronic Pain Outreach Association (NCPOA): www.chronicpain.org Occupational Safety & Health Administration: ArmyDictionary.fi Document Released: 04/13/2004 Document Revised: 05/12/2011 Document Reviewed: 04/17/2008 [facility] Patient Information 2012 [place], [place].

## 2013-06-01 ENCOUNTER — Ambulatory Visit
Admission: RE | Admit: 2013-06-01 | Discharge: 2013-06-01 | Disposition: A | Discharge status: Home / Self Care | Payer: Self-pay | Source: Ambulatory Visit | Attending: Oncology | Admitting: Oncology

## 2013-06-01 DIAGNOSIS — C50911 Malignant neoplasm of unspecified site of right female breast: Secondary | ICD-10-CM

## 2013-06-12 ENCOUNTER — Encounter (HOSPITAL_BASED_OUTPATIENT_CLINIC_OR_DEPARTMENT_OTHER): Payer: Self-pay | Admitting: *Deleted

## 2013-06-18 ENCOUNTER — Other Ambulatory Visit: Payer: Self-pay | Admitting: Orthopedic Surgery

## 2013-06-18 NOTE — H&P (Signed)
  Nichole [name] is an 62 y.o. female.   Chief Complaint: c/o chronic and progressive numbness and tingling of the left hand  HPI: . Nichole [name] is a 62 year old retired woman who has had a history of breast cancer treated with lumpectomy, chemo and radiation in 2012. She has been on Tamoxifen for a year. For the past year she has had symptoms of numbness in her hand, left greater than right. She has been night splinting without relief. She now seeks an upper extremity orthopaedic consult.     Past Medical History  Diagnosis Date  . Hyperlipidemia     no current med.  Marland Kitchen History of breast cancer 07/2011    right  . History of chemotherapy 11/2011  . 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 Medial epicondylitis 05/2013    left    Past Surgical History  Procedure Laterality Date  . Sentinel lymph node biopsy Right 08/2011  . Breast lumpectomy Right 08/2011  . Portacath placement  2013  . Port-a-cath removal  06/2012    Family History  Problem Relation Age of Onset  . Colon cancer Mother     dx postpartum  . Diabetes Father   . Breast cancer Cousin 51  . Diabetes Paternal Grandmother   . Breast cancer Maternal Aunt   . Congestive Heart Failure Mother   . Alzheimer's disease Mother    Social History:  reports that she quit smoking about 21 years ago. She has never used smokeless tobacco. She reports that she does not drink alcohol or use illicit drugs.  Allergies:  Allergies  Allergen Reactions  . Pork-Derived Products Rash    No prescriptions prior to admission    No results found for this or any previous visit (from the past 48 hour(s)).  No results found.   Pertinent items are noted in HPI.  Height 4\' 10"  (1.473 m), weight 72.576 kg (160 lb).  General appearance: alert Head: Normocephalic, without obvious abnormality Neck: supple, symmetrical, trachea midline Resp: clear to auscultation bilaterally Cardio: regular rate and rhythm GI: normal findings:  bowel sounds normal Extremities: Inspection of her hands reveals no muscle atrophy or deformity. Her sweat patterns and dermatoglyphics are preserved. She has full ROM of her fingers in flexion/extension. She has a positive wrist flexion test on the left at 1 minute and positive Tinel's sign on the left. She has intact ulnar and radial sensibility.   X-ray of her wrist demonstrates normal bony anatomy at the small ossicle adjacent to her triquetrum. She does not show signs of significant osteoarthritis or dissociative carpal instability. She is ulnar neutral to slightly plus on a pronated film.   Dr. Johna Roles completed detailed electrodiagnostic studies. These indeed reveal evidence of moderate left carpal tunnel syndrome and mild right carpal tunnel syndrome borderline to subclinical based on her limited symptoms.   Pulses: 2+ and symmetric Skin: normal Neurologic: Grossly normal    Assessment/Plan Impression:Left CTS  Plan:To the OR for left CTR.The procedure, risks,benefits and post-op course were discussed with the patient at length and they were in agreement with the plan.  DASNOIT,Nichole [name] 06/18/2013, 1:42 PM   H&P documentation: 06/19/2013  -History and Physical Reviewed  -Patient has been re-examined  -No change in the plan of care  Wyn Forster, MD

## 2013-06-19 ENCOUNTER — Encounter (HOSPITAL_BASED_OUTPATIENT_CLINIC_OR_DEPARTMENT_OTHER): Payer: Self-pay | Admitting: Anesthesiology

## 2013-06-19 ENCOUNTER — Ambulatory Visit (HOSPITAL_BASED_OUTPATIENT_CLINIC_OR_DEPARTMENT_OTHER): Payer: Federal, State, Local not specified - PPO | Admitting: Anesthesiology

## 2013-06-19 ENCOUNTER — Ambulatory Visit (HOSPITAL_BASED_OUTPATIENT_CLINIC_OR_DEPARTMENT_OTHER)
Admission: RE | Admit: 2013-06-19 | Discharge: 2013-06-19 | Disposition: A | Discharge status: Home / Self Care | Payer: Federal, State, Local not specified - PPO | Source: Ambulatory Visit | Attending: Orthopedic Surgery | Admitting: Orthopedic Surgery

## 2013-06-19 ENCOUNTER — Encounter (HOSPITAL_BASED_OUTPATIENT_CLINIC_OR_DEPARTMENT_OTHER): Payer: Self-pay | Admitting: *Deleted

## 2013-06-19 ENCOUNTER — Encounter (HOSPITAL_BASED_OUTPATIENT_CLINIC_OR_DEPARTMENT_OTHER)
Admission: RE | Disposition: A | Discharge status: Home / Self Care | Payer: Self-pay | Source: Ambulatory Visit | Attending: Orthopedic Surgery

## 2013-06-19 DIAGNOSIS — G5600 Carpal tunnel syndrome, unspecified upper limb: Secondary | ICD-10-CM | POA: Insufficient documentation

## 2013-06-19 DIAGNOSIS — Z9221 Personal history of antineoplastic chemotherapy: Secondary | ICD-10-CM | POA: Insufficient documentation

## 2013-06-19 DIAGNOSIS — M7700 Medial epicondylitis, unspecified elbow: Secondary | ICD-10-CM | POA: Insufficient documentation

## 2013-06-19 DIAGNOSIS — Z853 Personal history of malignant neoplasm of breast: Secondary | ICD-10-CM | POA: Insufficient documentation

## 2013-06-19 DIAGNOSIS — Z87891 Personal history of nicotine dependence: Secondary | ICD-10-CM | POA: Insufficient documentation

## 2013-06-19 DIAGNOSIS — E785 Hyperlipidemia, unspecified: Secondary | ICD-10-CM | POA: Insufficient documentation

## 2013-06-19 HISTORY — DX: Personal history of antineoplastic chemotherapy: Z92.21

## 2013-06-19 HISTORY — DX: Personal history of malignant neoplasm of breast: Z85.3

## 2013-06-19 HISTORY — PX: CARPAL TUNNEL RELEASE: SHX101

## 2013-06-19 SURGERY — CARPAL TUNNEL RELEASE
Anesthesia: Regional | Site: Wrist | Laterality: Left | Wound class: Clean

## 2013-06-19 MED ORDER — LACTATED RINGERS IV SOLN
INTRAVENOUS | Status: DC
  Administered 2013-06-19 (×2): via INTRAVENOUS

## 2013-06-19 MED ORDER — HYDROMORPHONE HCL PF 1 MG/ML IJ SOLN: 0.2500 mg | INTRAMUSCULAR | Status: DC | PRN

## 2013-06-19 MED ORDER — HYDROCODONE-ACETAMINOPHEN 5-500 MG PO TABS: 1.0000 | ORAL_TABLET | Freq: Four times a day (QID) | ORAL | Status: DC | PRN

## 2013-06-19 MED ORDER — LIDOCAINE HCL (CARDIAC) 20 MG/ML IV SOLN
INTRAVENOUS | Status: DC | PRN
  Administered 2013-06-19: 80 mg via INTRAVENOUS

## 2013-06-19 MED ORDER — PROPOFOL 10 MG/ML IV BOLUS
INTRAVENOUS | Status: DC | PRN
  Administered 2013-06-19: 200 mg via INTRAVENOUS

## 2013-06-19 MED ORDER — MIDAZOLAM HCL 2 MG/2ML IJ SOLN: 1.0000 mg | INTRAMUSCULAR | Status: DC | PRN

## 2013-06-19 MED ORDER — FENTANYL CITRATE 0.05 MG/ML IJ SOLN: 50.0000 ug | INTRAMUSCULAR | Status: DC | PRN

## 2013-06-19 MED ORDER — LIDOCAINE HCL 2 % IJ SOLN
INTRAMUSCULAR | Status: DC | PRN
  Administered 2013-06-19: 3 mL

## 2013-06-19 MED ORDER — MIDAZOLAM HCL 2 MG/ML PO SYRP: 12.0000 mg | ORAL_SOLUTION | Freq: Once | ORAL | Status: DC | PRN

## 2013-06-19 MED ORDER — FENTANYL CITRATE 0.05 MG/ML IJ SOLN
INTRAMUSCULAR | Status: DC | PRN
  Administered 2013-06-19: 50 ug via INTRAVENOUS

## 2013-06-19 MED ORDER — CHLORHEXIDINE GLUCONATE 4 % EX LIQD
60.0000 mL | Freq: Once | CUTANEOUS | Status: DC
Start: 2013-06-19 — End: 2013-06-19

## 2013-06-19 MED ORDER — MIDAZOLAM HCL 5 MG/5ML IJ SOLN
INTRAMUSCULAR | Status: DC | PRN
  Administered 2013-06-19: 2 mg via INTRAVENOUS

## 2013-06-19 SURGICAL SUPPLY — 39 items
BANDAGE ADHESIVE 1X3 (GAUZE/BANDAGES/DRESSINGS) IMPLANT
BANDAGE ELASTIC 3 VELCRO ST LF (GAUZE/BANDAGES/DRESSINGS) ×2 IMPLANT
BLADE SURG 15 STRL LF DISP TIS (BLADE) ×1 IMPLANT
BLADE SURG 15 STRL SS (BLADE) ×1
BNDG COHESIVE 3X5 TAN STRL LF (GAUZE/BANDAGES/DRESSINGS) ×2 IMPLANT
BNDG ESMARK 4X9 LF (GAUZE/BANDAGES/DRESSINGS) ×2 IMPLANT
BRUSH SCRUB EZ PLAIN DRY (MISCELLANEOUS) ×2 IMPLANT
CLOTH BEACON ORANGE TIMEOUT ST (SAFETY) ×2 IMPLANT
CORDS BIPOLAR (ELECTRODE) ×2 IMPLANT
COVER MAYO STAND STRL (DRAPES) ×2 IMPLANT
COVER TABLE BACK 60X90 (DRAPES) ×2 IMPLANT
CUFF TOURNIQUET SINGLE 18IN (TOURNIQUET CUFF) ×2 IMPLANT
DECANTER SPIKE VIAL GLASS SM (MISCELLANEOUS) IMPLANT
DRAPE EXTREMITY T 121X128X90 (DRAPE) ×2 IMPLANT
DRAPE SURG 17X23 STRL (DRAPES) ×2 IMPLANT
GLOVE BIO SURGEON STRL SZ 6.5 (GLOVE) ×2 IMPLANT
GLOVE BIOGEL M STRL SZ7.5 (GLOVE) ×2 IMPLANT
GLOVE BIOGEL PI IND STRL 7.0 (GLOVE) ×1 IMPLANT
GLOVE BIOGEL PI INDICATOR 7.0 (GLOVE) ×1
GLOVE ORTHO TXT STRL SZ7.5 (GLOVE) ×4 IMPLANT
GOWN BRE IMP PREV XXLGXLNG (GOWN DISPOSABLE) ×4 IMPLANT
GOWN PREVENTION PLUS XLARGE (GOWN DISPOSABLE) ×2 IMPLANT
NEEDLE 27GAX1X1/2 (NEEDLE) ×2 IMPLANT
PACK BASIN DAY SURGERY FS (CUSTOM PROCEDURE TRAY) ×2 IMPLANT
PAD CAST 3X4 CTTN HI CHSV (CAST SUPPLIES) ×1 IMPLANT
PADDING CAST ABS 4INX4YD NS (CAST SUPPLIES) ×1
PADDING CAST ABS COTTON 4X4 ST (CAST SUPPLIES) ×1 IMPLANT
PADDING CAST COTTON 3X4 STRL (CAST SUPPLIES) ×1
SPLINT PLASTER CAST XFAST 3X15 (CAST SUPPLIES) ×5 IMPLANT
SPLINT PLASTER XTRA FASTSET 3X (CAST SUPPLIES) ×5
SPONGE GAUZE 4X4 12PLY (GAUZE/BANDAGES/DRESSINGS) ×2 IMPLANT
STOCKINETTE 4X48 STRL (DRAPES) ×2 IMPLANT
STRIP CLOSURE SKIN 1/2X4 (GAUZE/BANDAGES/DRESSINGS) ×2 IMPLANT
SUT PROLENE 3 0 PS 2 (SUTURE) ×2 IMPLANT
SYR 3ML 23GX1 SAFETY (SYRINGE) IMPLANT
SYR CONTROL 10ML LL (SYRINGE) ×2 IMPLANT
TOWEL OR 17X24 6PK STRL BLUE (TOWEL DISPOSABLE) ×2 IMPLANT
TRAY DSU PREP LF (CUSTOM PROCEDURE TRAY) ×2 IMPLANT
UNDERPAD 30X30 INCONTINENT (UNDERPADS AND DIAPERS) ×2 IMPLANT

## 2013-06-19 NOTE — Anesthesia Preprocedure Evaluation (Signed)
Anesthesia Evaluation  Patient identified by MRN, date of birth, ID band Patient awake    Reviewed: Allergy & Precautions, H&P , NPO status , Patient's Chart, lab work & pertinent test results  History of Anesthesia Complications (+) PONV  Airway Mallampati: II      Dental   Pulmonary neg pulmonary ROS,          Cardiovascular negative cardio ROS      Neuro/Psych    GI/Hepatic negative GI ROS, Neg liver ROS,   Endo/Other  negative endocrine ROS  Renal/GU negative Renal ROS     Musculoskeletal   Abdominal   Peds  Hematology   Anesthesia Other Findings   Reproductive/Obstetrics                           Anesthesia Physical Anesthesia Plan  ASA: II  Anesthesia Plan: Bier Block   Post-op Pain Management:    Induction: Intravenous  Airway Management Planned: Simple Face Mask  Additional Equipment:   Intra-op Plan:   Post-operative Plan:   Informed Consent: I have reviewed the patients History and Physical, chart, labs and discussed the procedure including the risks, benefits and alternatives for the proposed anesthesia with the patient or authorized representative who has indicated his/her understanding and acceptance.   Dental advisory given  Plan Discussed with: CRNA, Anesthesiologist and Surgeon  Anesthesia Plan Comments:         Anesthesia Quick Evaluation

## 2013-06-19 NOTE — Brief Op Note (Signed)
06/19/2013  12:45 PM  PATIENT:  Nichole [name]  62 y.o. female  PRE-OPERATIVE DIAGNOSIS:  LEFT CARPAL TUNNEL SYNDROME MEDIAL EPICONYLITIS  POST-OPERATIVE DIAGNOSIS:  LEFT CARPAL TUNNEL SYNDROME MEDIAL   PROCEDURE:  Procedure(s): CARPAL TUNNEL RELEASE (Left)  SURGEON:  Surgeon(s) and Role:    * Wyn Forster., MD - Primary  PHYSICIAN ASSISTANT:   ASSISTANTS: Mallory Shirk.A-C    ANESTHESIA:   general  EBL:     BLOOD ADMINISTERED:none  DRAINS: none   LOCAL MEDICATIONS USED:  XYLOCAINE   SPECIMEN:  No Specimen  DISPOSITION OF SPECIMEN:  N/A  COUNTS:  YES  TOURNIQUET:   Total Tourniquet Time Documented: Upper Arm (Left) - 6 minutes Total: Upper Arm (Left) - 6 minutes   DICTATION: .Other Dictation: Dictation Number [id number]  PLAN OF CARE: Admit for overnight observation  PATIENT DISPOSITION:  PACU - hemodynamically stable.   Delay start of Pharmacological VTE agent (>24hrs) due to surgical blood loss or risk of bleeding: not applicable

## 2013-06-19 NOTE — Transfer of Care (Signed)
Immediate Anesthesia Transfer of Care Note  Patient: Nichole [name]  Procedure(s) Performed: Procedure(s): CARPAL TUNNEL RELEASE (Left)  Patient Location: PACU  Anesthesia Type:General  Level of Consciousness: awake, sedated and patient cooperative  Airway & Oxygen Therapy: Patient Spontanous Breathing and Patient connected to face mask oxygen  Post-op Assessment: Report given to PACU RN and Post -op Vital signs reviewed and stable  Post vital signs: Reviewed and stable  Complications: No apparent anesthesia complications

## 2013-06-19 NOTE — Anesthesia Postprocedure Evaluation (Signed)
Anesthesia Post Note  Patient: Nichole [name]  Procedure(s) Performed: Procedure(s) (LRB): CARPAL TUNNEL RELEASE (Left)  Anesthesia type: general  Patient location: PACU  Post pain: Pain level controlled  Post assessment: Patient's Cardiovascular Status Stable  Last Vitals:  Filed Vitals:   06/19/13 1315  BP: 142/74  Pulse: 63  Temp:   Resp: 13    Post vital signs: Reviewed and stable  Level of consciousness: sedated  Complications: No apparent anesthesia complications

## 2013-06-20 ENCOUNTER — Encounter (HOSPITAL_BASED_OUTPATIENT_CLINIC_OR_DEPARTMENT_OTHER): Payer: Self-pay | Admitting: Orthopedic Surgery

## 2013-06-20 NOTE — Op Note (Deleted)
Nichole [name], Nichole [name]              ACCOUNT NO.:  [id number]  MEDICAL RECORD NO.:  [id number]  LOCATION:                               FACILITY:  [facility]  PHYSICIAN:  Katy Fitch. Roshun Klingensmith, M.D. DATE OF BIRTH:  1951-02-17  DATE OF PROCEDURE:  06/19/2013 DATE OF DISCHARGE:  06/19/2013                              OPERATIVE REPORT   PREOPERATIVE DIAGNOSIS:  Chronic median entrapment neuropathy, left wrist with background history of breast cancer and tamoxifen therapy.  POSTOPERATIVE DIAGNOSIS:  Chronic median entrapment neuropathy, left wrist with background history of breast cancer and tamoxifen therapy.  OPERATION:  Release of left transverse carpal ligament.  OPERATING SURGEON:  Katy Fitch. Lettie Czarnecki, M.D.  ASSISTANT:  Marveen Reeks. Dasnoit, PA-C.  ANESTHESIA:  General by LMA.  SUPERVISING ANESTHESIOLOGIST:  Achille Rich, MD.  INDICATIONS:  [place] is a 62 year old woman referred through the courtesy of Dr. Ruthann Cancer, Oncologist for evaluation and management of hand numbness.  She had a history of lumpectomy and subsequent radiation therapy.  She is on long-term tamoxifen therapy.  She developed bilateral hand numbness.  Her exam was compatible with carpal tunnel syndrome.  Electrodiagnostic studies revealed bilateral carpal tunnel syndrome left worse than right.  We advised to proceed with release of left transverse carpal ligament. The surgery aftercare, risks, and benefits were explained in detail. Questions were invited and answered.  Both in the office and in the holding area preoperatively.  She was interviewed by Dr. Randa Evens of anesthesia and later by Dr. Chaney Malling.  After reviewing anesthetic choices, she ultimately selected general anesthesia by LMA.  PROCEDURE IN DETAIL:  Shylee Durrett was brought to room 2 of the [facility] and placed supine position on the operating table.  Following induction of general anesthesia by LMA technique, the left arm was prepped  with Betadine soap and solution, sterilely draped.  A pneumatic tourniquet was applied to the proximal brachium.  Following exsanguination of the left arm with Esmarch bandage, the arterial tourniquet on proximal brachium was inflated to 220 mmHg. Following routine surgical time-out, procedure commenced with a 15 mm incision in line of the ring finger in the palm.  Subcutaneous tissues were carefully divided in the palmar fascia.  This split longitudinally to the common sensory branch of the median nerve and superficial palmar arch.  The canal was sounded with a Penfield 4 elevator followed by sequential release of the ulnar aspect of the transverse carpal ligament with scissors extending into the distal forearm.  The volar forearm fascia was released subcutaneously.  This widely opened carpal canal.  No mass or other predicaments were noted.  Bleeding points along the margin of the released ligament were electrocauterized with bipolar current.  The wound was then repaired with intradermal 3-0 Prolene suture.  A compressive dressing was applied with a volar plaster splint maintaining the wrist in 5 degrees of dorsiflexion.  For aftercare, Ms. Sheriff is provided a prescription for hydrocodone 5/325 one p.o. q.4-6 hours p.r.n. pain, #20 tabs without refill.     Katy Fitch Kalan Rinn, M.D.     RVS/MEDQ  D:  06/19/2013  T:  06/20/2013  Job:  [id number]  cc:   Valentino Hue  Magrinat, M.D. Valerie A. Felicity Coyer, MD

## 2013-06-20 NOTE — Op Note (Signed)
NAME:  Nichole [name], Nichole [name]              ACCOUNT NO.:  [id number]  MEDICAL RECORD NO.:  [id number]  LOCATION:                               FACILITY:  [facility]  PHYSICIAN:  Dara Camargo V. Kriss Perleberg, M.D. DATE OF BIRTH:  10/06/1950  DATE OF PROCEDURE:  06/19/2013 DATE OF DISCHARGE:  06/19/2013                              OPERATIVE REPORT   PREOPERATIVE DIAGNOSIS:  Chronic median entrapment neuropathy, left wrist with background history of breast cancer and tamoxifen therapy.  POSTOPERATIVE DIAGNOSIS:  Chronic median entrapment neuropathy, left wrist with background history of breast cancer and tamoxifen therapy.  OPERATION:  Release of left transverse carpal ligament.  OPERATING SURGEON:  Hannan Tetzlaff V. Keoshia Steinmetz, M.D.  ASSISTANT:  Kolter Reaver J. Dasnoit, PA-C.  ANESTHESIA:  General by LMA.  SUPERVISING ANESTHESIOLOGIST:  Adam Hodierne, MD.  INDICATIONS:  [name] is a 62-year-old woman referred through the courtesy of Dr. Gustav Magrinat, Oncologist for evaluation and management of hand numbness.  She had a history of lumpectomy and subsequent radiation therapy.  She is on long-term tamoxifen therapy.  She developed bilateral hand numbness.  Her exam was compatible with carpal tunnel syndrome.  Electrodiagnostic studies revealed bilateral carpal tunnel syndrome left worse than right.  We advised to proceed with release of left transverse carpal ligament. The surgery aftercare, risks, and benefits were explained in detail. Questions were invited and answered.  Both in the office and in the holding area preoperatively.  She was interviewed by Dr. Edwards of anesthesia and later by Dr. Hodierne.  After reviewing anesthetic choices, she ultimately selected general anesthesia by LMA.  PROCEDURE IN DETAIL:  [name] was brought to room 2 of the [facility] and placed supine position on the operating table.  Following induction of general anesthesia by LMA technique, the left arm was prepped  with Betadine soap and solution, sterilely draped.  A pneumatic tourniquet was applied to the proximal brachium.  Following exsanguination of the left arm with Esmarch bandage, the arterial tourniquet on proximal brachium was inflated to 220 mmHg. Following routine surgical time-out, procedure commenced with a 15 mm incision in line of the ring finger in the palm.  Subcutaneous tissues were carefully divided in the palmar fascia.  This split longitudinally to the common sensory branch of the median nerve and superficial palmar arch.  The canal was sounded with a Penfield 4 elevator followed by sequential release of the ulnar aspect of the transverse carpal ligament with scissors extending into the distal forearm.  The volar forearm fascia was released subcutaneously.  This widely opened carpal canal.  No mass or other predicaments were noted.  Bleeding points along the margin of the released ligament were electrocauterized with bipolar current.  The wound was then repaired with intradermal 3-0 Prolene suture.  A compressive dressing was applied with a volar plaster splint maintaining the wrist in 5 degrees of dorsiflexion.  For aftercare, Ms. Poznanski is provided a prescription for hydrocodone 5/325 one p.o. q.4-6 hours p.r.n. pain, #20 tabs without refill.     Lorimer Tiberio V. Tahisha Hakim, M.D.     RVS/MEDQ  D:  06/19/2013  T:  06/20/2013  Job:  [id number]  cc:   Gustav C   Magrinat, M.D. Valerie A. Leschber, MD 

## 2013-07-09 ENCOUNTER — Ambulatory Visit (INDEPENDENT_AMBULATORY_CARE_PROVIDER_SITE_OTHER): Payer: Federal, State, Local not specified - PPO | Admitting: Gynecology

## 2013-07-09 ENCOUNTER — Other Ambulatory Visit (HOSPITAL_COMMUNITY)
Admission: RE | Admit: 2013-07-09 | Discharge: 2013-07-09 | Disposition: A | Discharge status: Home / Self Care | Payer: Federal, State, Local not specified - PPO | Source: Ambulatory Visit | Attending: Gynecology | Admitting: Gynecology

## 2013-07-09 ENCOUNTER — Encounter: Payer: Self-pay | Admitting: Gynecology

## 2013-07-09 VITALS — BP 124/78 | Ht 58.5 in | Wt 166.0 lb

## 2013-07-09 DIAGNOSIS — N951 Menopausal and female climacteric states: Secondary | ICD-10-CM

## 2013-07-09 DIAGNOSIS — Z853 Personal history of malignant neoplasm of breast: Secondary | ICD-10-CM

## 2013-07-09 DIAGNOSIS — N942 Vaginismus: Secondary | ICD-10-CM

## 2013-07-09 DIAGNOSIS — R635 Abnormal weight gain: Secondary | ICD-10-CM

## 2013-07-09 DIAGNOSIS — Z01419 Encounter for gynecological examination (general) (routine) without abnormal findings: Secondary | ICD-10-CM | POA: Insufficient documentation

## 2013-07-09 NOTE — Patient Instructions (Signed)
Ecografa transvaginal (Transvaginal Ultrasound) La ecografa transvaginal es una ecografa plvica en la que se utiliza una probeta metlica que se coloca en la vagina, para observar los rganos femeninos. El ecgrafo enva ondas sonoras desde un transductor (sonda). Estas ondas sonoras chocan contra las estructuras del cuerpo (como un eco) y crean una imagen. La imagen se observa en un monitor. Se denomina transvaginal debido a que la sonda se inserta dentro de la vagina. Puede haber una pequea molestia por la introduccin de la sonda. Esta prueba tambin puede realizarse durante el embarazo. La ecografa endovaginal es otro nombre para la ecografa transvaginal. En una ecografa transabdominal, la sonda se coloca en la parte externa del abdomen. Este mtodo no ofrece imgenes tan buenas como la tcnica transvaginal. La ecogafa transvaginal se utiliza para observar alteraciones en el tracto genital femenino. Entre ellos se incluyen:  Problemas de infertilidad.  Malformaciones congnitas (defecto de nacimiento) del tero y los ovarios.  Tumores en el tero.  Hemorragias anormales.  Tumores y quistes de ovario.  Abscesos (tejidos inflamados y pus) en la pelvis.  Dolor abdominal o plvico sin causa aparente.  Infecciones plvicas. DURANTE EL EMBARAZO, SE UTILIZA PAR OBSERVAR:  Embarazos normales.  Un embarazo ectpico (embarazo fuera del tero).  Latidos cardacos fetales.  Anormalidades de la pelvis que no se observan bien con la ecografa transabdominal.  Sospecha de gemelos o embarazo mltiple.  Aborto inminente.  Problemas en el cuello del tero (cuello incompetente, no permanece cerrado para contener al beb).  Cuando se realiza una amniocentesis (se retira lquido de la bolsa amnitica, para ser evaluado).  Al buscar anormalidades en el beb.  Para controlar el crecimiento, el desarrollo y la edad del feto.  Para medir la cantidad de lquido en el saco  amnitico.  Cuando se realiza una versin externa del beb (se lo mueve a una posicin correcta).  Evaluar al beb en embarazos de alto riesgo (perfil biofsico).  Si se sospecha el deceso del beb (muerte). En algunos casos, se utiliza un mtodo especial denominado ecografa con infusin salina, para una observacin ms precisa del tero. Se inyecta solucin salina (agua con sal) dentro del tero en pacientes no embarazadas para observar mejor su interior. Este mtodo no se utiliza en mujeres embarazadas. La probeta tambin puede usarse para obtener biopsias de reas anormales, para drenar lquido de quistes de ovario y para hallar un DIU (dispositivo intrauterino para el control de la natalidad) que no pueda ubicarse. PREPARACIN PARA LA PRUEBA La ecografa transvaginal se realiza con la vejiga vaca. La ecografa transabdominal se realiza con la vejiga llena. Podrn solicitarle que beba varios vasos de agua antes del examen. En algunos casos se realiza una ecografa transabdominal antes de la ecografa transvaginal para obervar los rganos del abdomen. PROCEDIMIENTO  Deber acostarse en una cama, con las rodillas dobladas y los pies en los estribos. La probeta se cubre con un condn. Dentro de la vagina y en la probeta se aplica un lubricante estril. El lubricante ayuda a transmitir las ondas sonoras y evita la irritacin de la vagina. El mdico mover la sonda en el interior de la cavidad vaginal para escanear las estructuras plvicas. Un examen normal mostrar una pelvis normal y contenidos normales en su interior. Una prueba anormal mostrar anormalidades en la pelvis, la placenta o el beb. LAS CAUSAS DE UN RESULTADO ANORMAL PUEDEN SER:  Crecimientos o tumores en:  El tero.  Los ovarios.  La vagina.  Otras estructuras plvicas.  Crecimientos no cancerosos   en el tero y los ovarios.  El ovario se retuerce y se corta el suministro de sangre (torsin ovrica).  Las reas de  infeccin incluyen:  Enfermedad inflamatoria plvica.  Un absceso en la pelvis.  Ubicacin de un DIU. LOS PROBLEMAS QUE PUEDEN HALLARSE EN UNA MUJER EMBARAZADA SON:  Embarazo ectpico (embarazo fuera del tero).  Embarazos mltiples.  Dilatacin (apertura) precoz anormal del cuello del tero. Esto puede indicar un cuello incompetente y un parto prematuro.  Aborto inminente.  Muerte fetal.  Los problemas con la placenta incluyen:  La placenta se ha desarrollado sobre la abertura del cuello del tero (placenta previa).  La placenta se ha separado anticipadamente en el tero (abrupcin placentaria).  La placenta se desarrolla en el msculo del tero (placenta acreta).  Tumores del embarazo, incluyendo la enfermedad trofoblstica gestacional. Se trata de un embarazo anormal en el que no hay feto. El tero se llena de quistes similares a uvas que en algunos casos son cancerosos.  Posicin incorrecta del feto (de nalgas, de vrtice).  Retraso del desarrollo fetal intrauterino (escaso desarrollo en el tero).  Anormalidades o infeccin fetal. RIESGOS Y COMPLICACIONES No hay riesgos conocidos para la ecografa. No se toman radiografas cuando se realiza una ecografa. Document Released: 12/16/2008 Document Revised: 11/22/2011 ExitCare Patient Information 2014 [employer].  

## 2013-07-09 NOTE — Progress Notes (Signed)
Nichole [name] 1951-06-06 [id number]   History:    61 y.o.  for annual gyn exam whose main complaints today. 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 Patient is status post adjuvant radiation treatment which was completed in February of 2013. She also completed in June 2012 six cycles of carboplatin/ docetaxel/ trastuzumab.  She was on Herceptin for a short while after questions were raised in reference to her HER-2 status which was believed to be negative. Dr. Darnelle Catalan had started patient on tamoxifen 20 mg daily whereby she has recently completed one year. Patient was also BRCA negative. For vasomotor symptoms she was on Effexor 37.5 mg but she discontinued because she noticed it wasn't helping her much.  The patient had a colonoscopy 4 years ago and is scheduled for one next year. (Mother diagnosed with colon cancer). Patient herself had history of colon polyps in the past. Patient stated many years ago she had cryotherapy as well as either laser or LEEP conization but subsequent Pap smears have been negative. She also stated that in [place] far she had a normal bone density study in January of 2013 which was reportedly normal.   Past medical history,surgical history, family history and social history were all reviewed and documented in the EPIC chart.  Gynecologic History No LMP recorded. Patient is postmenopausal. Contraception: post menopausal status Last Pap: 2013. Results were: normal Last mammogram: 2014. Results were: normal  Obstetric History OB History  Gravida Para Term Preterm AB SAB TAB Ectopic Multiple Living  4 4 4       4     # Outcome Date GA Lbr Len/2nd Weight Sex Delivery Anes PTL Lv  4 TRM     M CS  N Y  3 TRM     M SVD  N Y  2 TRM     M SVD  N Y  1 TRM     M SVD  N Y       ROS: A ROS was performed and pertinent  positives and negatives are included in the history.  GENERAL: No fevers or chills. HEENT: No change in vision, no earache, sore throat or sinus congestion. NECK: No pain or stiffness. CARDIOVASCULAR: No chest pain or pressure. No palpitations. PULMONARY: No shortness of breath, cough or wheeze. GASTROINTESTINAL: No abdominal pain, nausea, vomiting or diarrhea, melena or bright red blood per rectum. GENITOURINARY: No urinary frequency, urgency, hesitancy or dysuria. MUSCULOSKELETAL: No joint or muscle pain, no back pain, no recent trauma. DERMATOLOGIC: No rash, no itching, no lesions. ENDOCRINE: No polyuria, polydipsia, no heat or cold intolerance. No recent change in weight. HEMATOLOGICAL: No anemia or easy bruising or bleeding. NEUROLOGIC: No headache, seizures, numbness, tingling or weakness. PSYCHIATRIC: No depression, no loss of interest in normal activity or change in sleep pattern.     Exam: chaperone present  BP 124/78  Ht 4' 10.5" (1.486 m)  Wt 166 lb (75.297 kg)  BMI 34.1 kg/m2  Body mass index is 34.1 kg/(m^2).  General appearance : Well developed well nourished female. No acute distress HEENT: Neck supple, trachea midline, no carotid bruits, no thyroidmegaly Lungs: Clear to auscultation, no rhonchi or wheezes, or rib retractions  Heart: Regular rate and rhythm, no murmurs or gallops Breast:Examined in sitting and supine position were symmetrical in appearance, no palpable masses or tenderness,  no skin retraction, no nipple  inversion, no nipple discharge, no skin discoloration, no axillary or supraclavicular lymphadenopathy Abdomen: no palpable masses or tenderness, no rebound or guarding Extremities: no edema or skin discoloration or tenderness  Pelvic:  Bartholin, Urethra, Skene Glands: Within normal limits             Vagina: No gross lesions or discharge  Cervix: No gross lesions or discharge  Uterus difficult to examine due to vaginismus  Adnexa  Difficult to examine due  to vaginismus Anus and perineum  normal   Rectovaginal  normal sphincter tone without palpated masses or tenderness             Hemoccult card provided     Assessment/Plan:  62 y.o. female for annual exam with history of right breast cancer complaining of vasomotor symptoms. Patient took Effexor for a short time was not getting good resolution. I'm going to recommend that she by/over-the-counter order Relizen which she can take 2 tablets daily for the relief of hot flashes have explained her this is a natural nonhormonal medication that has been used in [place] for over 15 years and has recently date of available in the [place]. This medication exhibits no hormonal activity and has no estrogenic effect. Pap smear was done today. She will return back to the office for an ultrasound for better assessment of her adnexa. Hemoccult cards were provided for her to symmetry office for testing.  Note: This dictation was prepared with  Dragon/digital dictation along withSmart phrase technology. Any transcriptional errors that result from this process are unintentional.   Ok Edwards MD, 1:49 PM 07/09/2013

## 2013-07-16 ENCOUNTER — Other Ambulatory Visit: Payer: Self-pay | Admitting: *Deleted

## 2013-07-16 DIAGNOSIS — E785 Hyperlipidemia, unspecified: Secondary | ICD-10-CM

## 2013-07-16 NOTE — Progress Notes (Signed)
Pt called to inquire about obtaining a "cholesterol level" due to prior elevation.  Order added with pt understanding to fast prior to lab draw.  Note appt is scheduled for 12noon with pt stating she rises late and fasting would not cause issues.

## 2013-07-17 ENCOUNTER — Other Ambulatory Visit (HOSPITAL_BASED_OUTPATIENT_CLINIC_OR_DEPARTMENT_OTHER): Payer: Federal, State, Local not specified - PPO

## 2013-07-17 DIAGNOSIS — E785 Hyperlipidemia, unspecified: Secondary | ICD-10-CM

## 2013-07-17 DIAGNOSIS — C50919 Malignant neoplasm of unspecified site of unspecified female breast: Secondary | ICD-10-CM

## 2013-07-17 DIAGNOSIS — C50911 Malignant neoplasm of unspecified site of right female breast: Secondary | ICD-10-CM

## 2013-07-17 LAB — CBC WITH DIFFERENTIAL/PLATELET
Basophils Absolute: 0 10*3/uL (ref 0.0–0.1)
EOS%: 2.2 % (ref 0.0–7.0)
HCT: 41.2 % (ref 34.8–46.6)
HGB: 13.6 g/dL (ref 11.6–15.9)
LYMPH%: 38.3 % (ref 14.0–49.7)
MCH: 29.9 pg (ref 25.1–34.0)
MCV: 90.3 fL (ref 79.5–101.0)
MONO#: 0.4 10*3/uL (ref 0.1–0.9)
MONO%: 6 % (ref 0.0–14.0)
NEUT%: 52.9 % (ref 38.4–76.8)
Platelets: 252 10*3/uL (ref 145–400)
RBC: 4.56 10*6/uL (ref 3.70–5.45)
WBC: 7.1 10*3/uL (ref 3.9–10.3)

## 2013-07-17 LAB — COMPREHENSIVE METABOLIC PANEL (CC13)
ALT: 20 U/L (ref 0–55)
AST: 21 U/L (ref 5–34)
Alkaline Phosphatase: 69 U/L (ref 40–150)
BUN: 11.4 mg/dL (ref 7.0–26.0)
Calcium: 9.4 mg/dL (ref 8.4–10.4)
Chloride: 108 mEq/L (ref 98–109)
Creatinine: 0.7 mg/dL (ref 0.6–1.1)
Glucose: 85 mg/dl (ref 70–140)

## 2013-07-17 LAB — LIPID PANEL
Cholesterol: 257 mg/dL — ABNORMAL HIGH (ref 0–200)
HDL: 56 mg/dL (ref >39)
LDL Cholesterol: 146 mg/dL — ABNORMAL HIGH (ref 0–99)
Triglycerides: 273 mg/dL — ABNORMAL HIGH (ref <150)
VLDL: 55 mg/dL — ABNORMAL HIGH (ref 0–40)

## 2013-07-18 ENCOUNTER — Ambulatory Visit (INDEPENDENT_AMBULATORY_CARE_PROVIDER_SITE_OTHER): Payer: Federal, State, Local not specified - PPO

## 2013-07-18 ENCOUNTER — Other Ambulatory Visit: Payer: Self-pay | Admitting: Gynecology

## 2013-07-18 ENCOUNTER — Ambulatory Visit (INDEPENDENT_AMBULATORY_CARE_PROVIDER_SITE_OTHER): Payer: Federal, State, Local not specified - PPO | Admitting: Gynecology

## 2013-07-18 DIAGNOSIS — Z79810 Long term (current) use of selective estrogen receptor modulators (SERMs): Secondary | ICD-10-CM

## 2013-07-18 DIAGNOSIS — N942 Vaginismus: Secondary | ICD-10-CM

## 2013-07-18 DIAGNOSIS — Z853 Personal history of malignant neoplasm of breast: Secondary | ICD-10-CM

## 2013-07-18 DIAGNOSIS — Z780 Asymptomatic menopausal state: Secondary | ICD-10-CM

## 2013-07-18 DIAGNOSIS — R9389 Abnormal findings on diagnostic imaging of other specified body structures: Secondary | ICD-10-CM

## 2013-07-18 DIAGNOSIS — N83339 Acquired atrophy of ovary and fallopian tube, unspecified side: Secondary | ICD-10-CM

## 2013-07-18 NOTE — Progress Notes (Signed)
Patient presented to the office today to discuss her ultrasound. Patient was seen in the office on October 27 for her annual exam. 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 Patient is status post adjuvant radiation treatment which was completed in February of 2013. She also completed in June 2012 six cycles of carboplatin/ docetaxel/ trastuzumab.  She was on Herceptin for a short while after questions were raised in reference to her HER-2 status which was believed to be negative. Dr. Darnelle Catalan had started patient on tamoxifen 20 mg daily whereby she has recently completed one year. Patient was also BRCA negative. For vasomotor symptoms she was on Effexor 37.5 mg but she discontinued because she noticed it wasn't helping her much.  Her ultrasound today demonstrated a uterus measured 8.6 x 4.7 x 4.1 cm with endometrial stripe of 7.8 mm both ovaries were atrophic otherwise but had had a homogeneous echo uterine pattern with prominent endometrium with cystic areas. Because of these findings it was recommended the patient undergo an endometrial biopsy to rule out endometrial hyperplasia since she is on tamoxifen. Patient denied any vaginal bleeding.  Her cervix was cleansed with Betadine solution after bimanual exam had been done demonstrated a normal anteverted uterus. A single-tooth tenaculum was placed on the anterior cervical lip. The cervix required some dilatation in an effort to facilitate insertion of the Pipelle the uterine cavity was then sampled and tissues submitted for histological evaluation. The single-tooth tenaculum was removed. Patient tolerated procedure well.  We'll notify the patient with the results of endometrial biopsy later this week.Patient took Effexor for a short time was not getting good resolution. I'm going to recommend that she  by/over-the-counter order Relizen which she can take 2 tablets daily for the relief of hot flashes have explained her this is a natural nonhormonal medication that has been used in [place] for over 15 years and has recently date of available in the [place]. This medication exhibits no hormonal activity and has no estrogenic effect.

## 2013-07-18 NOTE — Patient Instructions (Signed)
Biopsia de endometrio  (Endometrial Biopsy)  La biopsia de endometrio es un procedimiento en el que se toma una muestra de tejido del útero. Luego la muestra de tejido se observa en el microscopio para ver si el tejido es normal o anormal. El endometrio es el revestimiento interno del útero. Este procedimiento ayuda a determinar si está en el ciclo menstrual y de que modo los niveles de hormonas afectan el revestimiento del útero. Este procedimiento también se usa para evaluar el sangrado uterino o para diagnosticar el cáncer de endometrio, tuberculosis, pólipos o enfermedades inflamatorias.   INFORME A SU MÉDICO:  · Cualquier alergia que tenga.  · Todos los medicamentos que utiliza, incluyendo vitaminas, hierbas, gotas oftálmicas, cremas y medicamentos de venta libre.  · Problemas previos que usted o los miembros de su familia hayan tenido con el uso de anestésicos.  · Enfermedades de la sangre.  · Cirugías previas.  · Padecimientos médicos.  · Posible embarazo.  RIESGOS Y COMPLICACIONES  Generalmente es un procedimiento seguro. Sin embargo, como en cualquier procedimiento, pueden surgir complicaciones. Las complicaciones posibles son:  · Hemorragias.  · Infecciones pélvicas  · Lesión en la pared del útero con el instrumento utilizado para tomar la biopsia (raro).  ANTES DEL PROCEDIMIENTO   · Lleve un registro de sus ciclos menstruales según las indicaciones de su médico. Puede ser necesario que programe el procedimiento para un momento específico del ciclo menstrual.  · Tendrá que llevar un apósito sanitario para usar después del procedimiento.  · Pídale a alguna persona que la lleve a su casa después del procedimiento si le dan un medicamento para relajarse (sedante).  PROCEDIMIENTO   · Le podrán administrar un medicamento para relajarse.  · Deberá recostarse en una camilla con los pies y las piernas elevados, como en el examen pélvico.  · El médico insertará un instrumento (espéculo) en la vagina para observar  el cuello del útero.  · El cuello del útero será desinfectado con una solución antiséptica. Para adormecer el cuello del útero le aplicarán un medicamento (anestésico local ).  · Se utilizará un fórceps (tenáculo) para mantener el cuello firme.  · Se insertará un instrumento delgado, similar a una varilla (sonda uterina) a través del cuello del útero para determinar su longitud y la ubicación en la que será tomada la muestra para la biopsia.  · Luego se pasa un tubo delgado y flexible (catéter) a través del cuello del útero hasta el útero. El catéter se utiliza para recolectar la muestra de tejido del endometrio para la biopsia.  · El catéter y el espéculo se retirarán y la muestra se enviará al laboratorio para ser examinada.  DESPUÉS DEL PROCEDIMIENTO  · Descansará en una sala de recuperación hasta que esté lista para volver a su casa.  · Sentirá cólicos leves y tendrá una pequeña cantidad de sangrado vaginal durante algunos días después del procedimiento. Esto es normal.  · Asegúrese de obtener los resultados.  Document Released: 05/02/2013  ExitCare® Patient Information ©2014 [employer].

## 2013-07-19 ENCOUNTER — Other Ambulatory Visit: Payer: Self-pay

## 2013-07-23 ENCOUNTER — Telehealth: Payer: Self-pay

## 2013-07-23 NOTE — Telephone Encounter (Signed)
Please informed patient that typically endometrial biopsy or sonohysterogram or not routinely done on patients who are on tamoxifen unless they have any form of bleeding. Her endometrial biopsy was benign. Unless she has any bleeding we would just followup with the ultrasound in 12 months.

## 2013-07-23 NOTE — Telephone Encounter (Signed)
Patient advised.

## 2013-07-23 NOTE — Telephone Encounter (Signed)
Message copied by Keenan Bachelor on Mon Jul 23, 2013 12:26 PM ------      Message from: Ok Edwards      Created: Sat Jul 21, 2013  3:05 PM       Please inform patient that her pathology report was benign. ------

## 2013-07-23 NOTE — Telephone Encounter (Signed)
Patient informed path report was normal. SHe asked what you recommend in regards to follow-up on uterine lining. She said it was "out of range" and therefore she wondered if maybe she should recheck ultrasound in 6 mos instead of waiting for one year.   Pls advise

## 2013-07-23 NOTE — Telephone Encounter (Signed)
Left message for patient to call.

## 2013-07-24 ENCOUNTER — Telehealth: Payer: Self-pay | Admitting: Oncology

## 2013-07-24 ENCOUNTER — Ambulatory Visit (HOSPITAL_BASED_OUTPATIENT_CLINIC_OR_DEPARTMENT_OTHER): Payer: Federal, State, Local not specified - PPO | Admitting: Physician Assistant

## 2013-07-24 ENCOUNTER — Encounter: Payer: Self-pay | Admitting: Physician Assistant

## 2013-07-24 VITALS — BP 123/81 | HR 73 | Temp 98.0°F | Resp 20 | Ht 58.5 in | Wt 168.0 lb

## 2013-07-24 DIAGNOSIS — Z853 Personal history of malignant neoplasm of breast: Secondary | ICD-10-CM

## 2013-07-24 DIAGNOSIS — C50911 Malignant neoplasm of unspecified site of right female breast: Secondary | ICD-10-CM

## 2013-07-24 DIAGNOSIS — C50919 Malignant neoplasm of unspecified site of unspecified female breast: Secondary | ICD-10-CM

## 2013-07-24 DIAGNOSIS — R232 Flushing: Secondary | ICD-10-CM | POA: Insufficient documentation

## 2013-07-24 DIAGNOSIS — T451X5A Adverse effect of antineoplastic and immunosuppressive drugs, initial encounter: Secondary | ICD-10-CM | POA: Insufficient documentation

## 2013-07-24 DIAGNOSIS — R9389 Abnormal findings on diagnostic imaging of other specified body structures: Secondary | ICD-10-CM

## 2013-07-24 DIAGNOSIS — E785 Hyperlipidemia, unspecified: Secondary | ICD-10-CM

## 2013-07-24 DIAGNOSIS — R922 Inconclusive mammogram: Secondary | ICD-10-CM

## 2013-07-24 MED ORDER — TAMOXIFEN CITRATE 20 MG PO TABS: 20.0000 mg | ORAL_TABLET | Freq: Every day | ORAL | Status: DC

## 2013-07-24 NOTE — Progress Notes (Signed)
ID: Nichole [name]   DOB: 11/05/1950  MR#: [id number]  JXB#:[id number]  PCP: Nichole Paci, MD GYN: Dr Nichole [name] SU:  OTHER MD: Dr. Jeani [name]  CHIEF COMPLAINT:  Right Breast Cancer    HISTORY OF PRESENT ILLNESS: The patient herself noted a mass in her right breast and brought it to her physician's attention. She was living in [place] at that time. An ultrasound was done, showing a 1.7 cm spiculated nodule in the right breast with associated calcifications. There were no other findings of concern.  Lumpectomy was performed, with reportedly negative margins (I do not have those records). In Dr. Glori [name] note 11/04/2011 this tumor is described as triple positive.  The patient moved to [facility] for further treatment, and sentinel lymph node sampling in December of 2012 was negative. The original tissue obtained in [place] was reviewed, showing a 1.6 cm, grade 3 invasive ductal carcinoma. and the prognostic panel obtained in [place] showed the tumor to be estrogen receptor positive, progesterone receptor negative, and HER-2 negative in the invasive tumor (it was positive in the in situ tumor; SCT 12-30 at [facility]). (A note from that report states that HER-2 by [facility] was performed at [facility] and was reportedly positive in the invasive carcinoma. That report is not available to me today.)  The patient completed adjuvant radiation and was reassessed with an [facility] panel, which showed the tumor to be HER-2 negative, with an MIB-1 of 42%, again estrogen receptor positive and progesterone receptor negative. This panel predicted a 43% risk of recurrence at 8 years. The patient also had an Oncotype DX sent. This was again ER positive, PR negative, and HER-2 negative, and predicted a 34% risk of distant recurrence at 10 years if the patient's only adjuvant systemic treatment was tamoxifen for 5 years. With these numbers Nichole [name] discussed adjuvant  chemotherapy with the patient, and proposed carboplatin, docetaxel, and Herceptin, which was started on February 2013. On a note from 11/12/2011, Nichole [name] states "even though the HER-2-neu is negative, I am still opting for Herceptin based on the aggressiveness of the tumor." The chemotherapy was completed in June 2013. Herceptin was continued, the plan being to complete one year. The patient's subsequent history is as detailed below.  INTERVAL HISTORY: Nichole [name] returns today with her husband Nichole [name] for followup of her right breast cancer. They continue to live in the [place] part of the year, and in [place] part of the year, and in fact plans to return to [place] in mid December.   Physically, Nichole [name] is doing well. She continues to tolerate the tamoxifen well with the exception of continued hot flashes. She was previously on Effexor which she did not tolerate well. Dr. Lily [name] recently recommended Relizen which she has been taking for approximately one week now, and she is already noticing some improvement in the hot flashes. She recently had an endometrial biopsy under the care of Dr. Lily [name] after an ultrasound showed a thickened lining.  Nichole [name] herself was asymptomatic, and has had no problems with vaginal bleeding. Fortunately the pathology was benign.   Otherwise, Nichole [name] continues to be very anxious about the fact that the original mammogram did not show her breast cancer, and she does in fact have very dense breast tissue bilaterally. She went to discuss the possibility of a breast MRI today.  REVIEW OF SYSTEMS: Nichole [name] has had no recent illnesses and denies any fevers or chills. She has had no skin changes, abnormal bruising  or abnormal bleeding. Her energy level is good, as is her appetite, and she denies any nausea or change in bowel or bladder habits. She has shortness of breath with exertion which is stable. She denies any cough, phlegm production, chest pain, palpitations. She has no abnormal  headaches and denies dizziness or change in vision. She also denies any unusual myalgias, arthralgias, bony pain, or peripheral swelling.  A detailed review of systems is otherwise stable and noncontributory.    PAST MEDICAL HISTORY: Past Medical History  Diagnosis Date  . Hyperlipidemia     no current med.  Marland Kitchen History of breast cancer 07/2011    right  . History of chemotherapy 11/2011  . 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 Medial epicondylitis 05/2013    left  . Cancer december 2012    right breast    PAST SURGICAL HISTORY: Past Surgical History  Procedure Laterality Date  . Sentinel lymph node biopsy Right 08/2011  . Breast lumpectomy Right 08/2011  . Portacath placement  2013  . Port-a-cath removal  06/2012  . Carpal tunnel release Left 06/19/2013    Procedure: CARPAL TUNNEL RELEASE;  Surgeon: Nichole Forster., MD;  Location: [facility];  Service: Orthopedics;  Laterality: Left;    FAMILY HISTORY Family History  Problem Relation Age of Onset  . Colon cancer Mother     dx postpartum  . Congestive Heart Failure Mother   . Alzheimer's disease Mother   . Diabetes Father   . Breast cancer Cousin 51  . Diabetes Paternal Grandmother   . Breast cancer Maternal Aunt   . Breast cancer Maternal Aunt    The patient's father died at the age of 70. The patient's mother died at the age of 34 after a fall, in the setting of Alzheimer's disease. The patient has no siblings. There are some second-degree relatives with breast cancer. She has been tested for the BRCA 1 and 2 mutations, and was found to be negative.  GYNECOLOGIC HISTORY: Menarche age 76. First live birth age 85. She is GX P4. Last menstrual period 2006. Status post hormone replacement, discontinued November 2012.  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 Her husband, Nichole [name], recently retired from the [employer]. This is a second marriage for both of them. [name]'s 4 children are [name], 41, who lives in [place] and works as a Veterinary surgeon; [name], who works as a Personnel officer for an Public relations account executive firm in [place]; [name], who works as an Art gallery manager in [place]; and the youngest, [name], 28, who works as a Naval architect in [place]. The patient has 4 grandchildren and one on the way. She is a Air traffic controller.  ADVANCED DIRECTIVES: not in place  HEALTH MAINTENANCE:  (Updated November 2014) History  Substance Use Topics  . Smoking status: Former Smoker    Quit date: 05/23/1992  . Smokeless tobacco: Never Used  . Alcohol Use: No     Colonoscopy: Oct 2013/DrElnoria Howard (Repeat in 2018)  PAP:  Oct 2014, Dr. Lily [name]  Bone density:  Lipid panel:  Nov 2014, elevated    Allergies  Allergen Reactions  . Pork-Derived Products Rash    Current Outpatient Prescriptions  Medication Sig Dispense Refill  . HYDROcodone-acetaminophen (VICODIN) 5-500 MG per tablet Take 1 tablet by mouth every 6 (six) hours as needed for pain.  20 tablet  0  .  tamoxifen (NOLVADEX) 20 MG tablet Take 1 tablet (20 mg total) by mouth daily.  90 tablet  3  . UNABLE TO FIND Take 2 tablets by mouth daily. Med Name: Relizen  (OTC for hot flashes)       No current facility-administered medications for this visit.    OBJECTIVE: Middle-aged white woman who appears well and is in no acute distress  Filed Vitals:   07/24/13 1154  BP: 123/81  Pulse: 73  Temp: 98 F (36.7 C)  Resp: 20     Body mass index is 34.51 kg/(m^2).    ECOG FS: 0 Filed Weights   07/24/13 1154  Weight: 168 lb (76.204 kg)   Physical Exam: HEENT:  Sclerae anicteric.  Oropharynx clear. Buccal mucosa is pink and moist. NODES:  No cervical or supraclavicular lymphadenopathy palpated.  BREAST EXAM:  Right breast is status post lumpectomy. No suspicious nodularities or evidence of local recurrence.  Left breast is unremarkable. Breast tissue is dense bilaterally. Axillae are benign, no palpable lymphadenopathy. LUNGS:  Clear to auscultation bilaterally.  No wheezes or rhonchi HEART:  Regular rate and rhythm. No murmur  ABDOMEN:  Soft, nontender.  Positive bowel sounds.  MSK:  No focal spinal tenderness to palpation. Full range of motion in the upper extremities. EXTREMITIES:  No peripheral edema.   NEURO:  Nonfocal. Well oriented.  Positive affect.    LAB RESULTS: Lab Results  Component Value Date   WBC 7.1 07/17/2013   NEUTROABS 3.8 07/17/2013   HGB 13.6 07/17/2013   HCT 41.2 07/17/2013   MCV 90.3 07/17/2013   PLT 252 07/17/2013      Chemistry      Component Value Date/Time   NA 142 07/17/2013 1203   K 4.1 07/17/2013 1203   CL 110* 01/08/2013 0821   CO2 23 07/17/2013 1203   BUN 11.4 07/17/2013 1203   CREATININE 0.7 07/17/2013 1203      Component Value Date/Time   CALCIUM 9.4 07/17/2013 1203   ALKPHOS 69 07/17/2013 1203   AST 21 07/17/2013 1203   ALT 20 07/17/2013 1203   BILITOT 0.46 07/17/2013 1203     Lipid Panel     Component Value Date/Time   CHOL 257* 07/17/2013 1208   TRIG 273* 07/17/2013 1208   HDL 56 07/17/2013 1208   CHOLHDL 4.6 07/17/2013 1208   VLDL 55* 07/17/2013 1208   LDLCALC 146* 07/17/2013 1208      STUDIES: Review of outside studies shows bone scan 04/29/2012 showing uptake into adjacent ribs anterolaterally, likely ribs 5 and 6, felt to be posttraumatic (likely secondary to her recent cough paroxysms. Her most recent echocardiogram  12/29/2011 showed an ejection fraction in the 65-70% range. Bilateral breast MRIs obtained at December 2012 showed heterogeneously dense breasts with distortion at the side of the prior surgery. There were no other suspicious areas, other than a lymph node in the right axilla which may or may not have shown focal cortical thickening. (The patient subsequently underwent sentinel lymph node sampling.) Mammography from what a mild April  of 2012 was unremarkable. Abdominal ultrasonography in [place]  2012 showed hepatic steatosis. Ultrasound in [place] April 2012 showed a simple cystocele. The uterus and ovaries were unremarkable.  [facility] was un[facility].  Breast tissue is heterogeneously dense bilaterally.   ASSESSMENT: 62 y.o. BRCA negative [place] woman recently moved to [facility], s/p Right lumpectomy December 2012 for a pT1c pN0, stage 1A invasive ductal carcinoma, grade 3,  estrogen receptor positive, progesterone receptor negative, with a Ki-67 of 42%  (1) Oncotype DX recurrence score 54% predicting a 10-year risk of distant recurrence of 34% if the only adjuvant systemic treatment is tamoxifen x 5 years  (1) s/p adjuvant radiation completed February 2013  (2) s/p six cycles of carboplatin/ docetaxel/ trastuzumab completed June 2012  (3) started tamoxifen 05/14/2012  (4)  Status post endometrial biopsy in October 2014 for a thickened endometrial lining, pathology benign   PLAN:  Over half of our 45 minute appointment today was spent in addressing [name]'s concerns, answering her questions, reviewing her lab results and mammogram results, and reviewing her treatment plan.  She had many questions today regarding screening methods for breast cancer, including mammograms, ultrasound, and breast MRIs.  We also again discussed the side effects of the tamoxifen, including her hot flashes, and the possibility of uterine cancer. (Of course she continues to be followed very closely with Dr. Lily [name].)  With regards to her breast cancer, I think Nichole [name] is doing well, and there is no clinical evidence of disease recurrence. She is very concerned about the fact that her breast tissue is so dense, as confirmed on recent mammogram. We discussed a breast MRI, and she has not had an MRI in 2 years. She would like for [place] to try to schedule that for November before she returns to [place] in  December.  Nichole [name] will continue on tamoxifen, and this has been refilled for another year. Dr. Darnelle Catalan plan is to continue the tamoxifen for at least 2 years (until September 2015) then reassess and decide whether or not to switch to an aromatase inhibitor, or continue tamoxifen for total of 10 years.  Nichole [name] will have her next annual mammogram next September, and will return to see Dr. Darnelle Catalan in late September 2015 to review those results and discuss her treatment plan.  Otherwise, she will followup with [name] with regards to her elevated cholesterol and hyperlipidemia. We offered to make her appointments through our office, but she tells me she will contact Dr. Diamantina Monks office on her own.    [name] and her husband both voice understanding and agreement with our plan. They know to call with any changes or problems prior to her next appointment.     Nichole Staples PA-C    07/24/2013

## 2013-07-31 ENCOUNTER — Ambulatory Visit (HOSPITAL_COMMUNITY)
Admission: RE | Admit: 2013-07-31 | Discharge: 2013-07-31 | Disposition: A | Discharge status: Home / Self Care | Payer: Federal, State, Local not specified - PPO | Source: Ambulatory Visit | Attending: Physician Assistant | Admitting: Physician Assistant

## 2013-07-31 DIAGNOSIS — Z923 Personal history of irradiation: Secondary | ICD-10-CM | POA: Insufficient documentation

## 2013-07-31 DIAGNOSIS — R922 Inconclusive mammogram: Secondary | ICD-10-CM | POA: Insufficient documentation

## 2013-07-31 DIAGNOSIS — Z853 Personal history of malignant neoplasm of breast: Secondary | ICD-10-CM

## 2013-07-31 DIAGNOSIS — Z9221 Personal history of antineoplastic chemotherapy: Secondary | ICD-10-CM | POA: Insufficient documentation

## 2013-07-31 MED ORDER — GADOBENATE DIMEGLUMINE 529 MG/ML IV SOLN
16.0000 mL | Freq: Once | INTRAVENOUS | Status: AC | PRN
  Administered 2013-07-31: 16 mL via INTRAVENOUS

## 2013-08-02 ENCOUNTER — Other Ambulatory Visit: Payer: Federal, State, Local not specified - PPO | Admitting: Anesthesiology

## 2013-08-02 ENCOUNTER — Telehealth: Payer: Self-pay | Admitting: *Deleted

## 2013-08-02 DIAGNOSIS — Z1211 Encounter for screening for malignant neoplasm of colon: Secondary | ICD-10-CM

## 2013-08-02 NOTE — Telephone Encounter (Signed)
Per PA request. Called patient with results of MR bilateral breast exam. I told patient to follow -up with mammogram next Sept. 2015. Patient verbalized understanding. No further concerns. Message forwarded to Zollie Scale, PA-C.

## 2013-12-28 ENCOUNTER — Ambulatory Visit (INDEPENDENT_AMBULATORY_CARE_PROVIDER_SITE_OTHER): Payer: Federal, State, Local not specified - PPO | Admitting: Internal Medicine

## 2013-12-28 ENCOUNTER — Encounter: Payer: Self-pay | Admitting: Internal Medicine

## 2013-12-28 VITALS — BP 110/78 | HR 107 | Temp 97.8°F | Wt 168.8 lb

## 2013-12-28 DIAGNOSIS — IMO0002 Reserved for concepts with insufficient information to code with codable children: Secondary | ICD-10-CM

## 2013-12-28 DIAGNOSIS — M7051 Other bursitis of knee, right knee: Secondary | ICD-10-CM

## 2013-12-28 MED ORDER — DICLOFENAC SODIUM 1 % TD GEL: 2.0000 g | Freq: Four times a day (QID) | TRANSDERMAL | Status: DC

## 2013-12-28 MED ORDER — NABUMETONE 750 MG PO TABS: 750.0000 mg | ORAL_TABLET | Freq: Every day | ORAL | Status: DC

## 2013-12-28 NOTE — Progress Notes (Signed)
   Nichole [name] [id number] 12/28/2013  Chief Complaint  Patient presents with  . Leg Pain    Right    Subjective  HPI    Past Medical History  Diagnosis Date  . Hyperlipidemia     no current med.  Marland Kitchen History of breast cancer 07/2011    right  . History of chemotherapy 11/2011  . 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 Medial epicondylitis 05/2013    left  . Cancer december 2012    right breast    Past Surgical History  Procedure Laterality Date  . Sentinel lymph node biopsy Right 08/2011  . Breast lumpectomy Right 08/2011  . Portacath placement  2013  . Port-a-cath removal  06/2012  . Carpal tunnel release Left 06/19/2013    Procedure: CARPAL TUNNEL RELEASE;  Surgeon: Cammie Sickle., MD;  Location: [place];  Service: Orthopedics;  Laterality: Left;    Family History  Problem Relation Age of Onset  . Colon cancer Mother     dx postpartum  . Congestive Heart Failure Mother   . Alzheimer's disease Mother   . Diabetes Father   . Breast cancer Cousin 56  . Diabetes Paternal Grandmother   . Breast cancer Maternal Aunt   . Breast cancer Maternal Aunt     History  Substance Use Topics  . Smoking status: Former Smoker    Quit date: 05/23/1992  . Smokeless tobacco: Never Used  . Alcohol Use: No    Current Outpatient Prescriptions on File Prior to Visit  Medication Sig Dispense Refill  . tamoxifen (NOLVADEX) 20 MG tablet Take 1 tablet (20 mg total) by mouth daily.  90 tablet  3  . UNABLE TO FIND Take 2 tablets by mouth daily. Med Name: Relizen  (OTC for hot flashes)       No current facility-administered medications on file prior to visit.     Allergies: Allergies  Allergen Reactions  . Pork-Derived Products Rash    ROS     Objective  Filed Vitals:   12/28/13 1130  BP: 110/78  Pulse: 107  Temp: 97.8 F (36.6 C)  TempSrc: Oral  Weight: 168 lb 12.8 oz (76.567 kg)  SpO2: 94%    Physical Exam  BP Readings from  Last 3 Encounters:  12/28/13 110/78  07/24/13 123/81  07/09/13 124/78    Wt Readings from Last 3 Encounters:  12/28/13 168 lb 12.8 oz (76.567 kg)  07/24/13 168 lb (76.204 kg)  07/09/13 166 lb (75.297 kg)    Lab Results  Component Value Date   WBC 7.1 07/17/2013   HGB 13.6 07/17/2013   HCT 41.2 07/17/2013   PLT 252 07/17/2013   GLUCOSE 85 07/17/2013   CHOL 257* 07/17/2013   TRIG 273* 07/17/2013   HDL 56 07/17/2013   LDLDIRECT 127.0 01/16/2013   LDLCALC 146* 07/17/2013   ALT 20 07/17/2013   AST 21 07/17/2013   NA 142 07/17/2013   K 4.1 07/17/2013   CL 110* 01/08/2013   CREATININE 0.7 07/17/2013   BUN 11.4 07/17/2013   CO2 23 07/17/2013   TSH 2.449 05/23/2012    No results found.     Assessment and Plan  No Follow-up on file. Berenice Bouton, Student-PA

## 2013-12-28 NOTE — Patient Instructions (Addendum)
It was good to see you today.  We have reviewed your prior records including labs and tests today  Medications reviewed and updated Use generic Relafen twice a day for 10 days, then as needed Also use topical Voltaren to knee pain 4 times a day for 2 weeks, then as needed  Your prescription(s) have been submitted to your pharmacy. Please take as directed and contact our office if you believe you are having problem(s) with the medication(s).  If your knee pain symptoms are unimproved in next 2 weeks, please schedule a visit for steroid injection, call sooner if worse   Pes Anserinus Syndrome with Rehab The pes anserine, also known as the goose's foot, is an area of the shinbone (tibia) near the knee joint where the tendons of three of the muscles of the thigh insert into the bone. These muscles are important for bending the knee and bringing the leg across the body. Just underneath the three tendons that attach at the pes anserinus exists a fluid filled sac (bursa) that is meant to reduce the friction between the tendons and the tibia. Pes anserinus syndrome is a condition that is characterized by inflammation of the bursa (bursitis) and/ or tendonitis (inflammation of the tendon) and may cause severe pain in the lower portion of the inner (medial) side of the knee. SYMPTOMS   Pain and inflammation over the lower portion of the medial side of the knee.  Pain that worsens as the duration of an activity increases.  Pain that worsens when bending the knee, especially against resistance.  A crackling sound (crepitation) when the tendon or bursa is moved or touched. CAUSES  Bursitis and tendonitis are usually characterized as overuse injuries. Common mechanisms of injury include:  Stress placed on the knee from a sudden increase in the intensity, frequency, or duration of training.  Direct trauma to the upper leg (less common). RISK INCREASES WITH:  Endurance sports (distance running or  triathletes).  Making changes to or beginning a new training program.  Sports that place stress on the muscles that insert at the pes anserinus, such as those that require pivoting, cutting, or jumping.  Improper training.  Poor strength and flexibility  Failure to warm-up properly before activity.  Improper knee alignment ( knock knees).  Arthritis of the knee. PREVENTION  Warm up and stretch properly before activity.  Allow for adequate recovery between workouts.  Maintain physical fitness:  Strength, flexibility, and endurance.  Cardiovascular fitness.  Learn and use training methods that will reduce the stress placed on the pes anserinus.  Arch supports (orthotics) may be helpful for those with flat feet. PROGNOSIS  If treated properly, then the symptoms of pes anserinus syndrome usually resolve within 6 weeks.  RELATED COMPLICATIONS   Persistent and potentially chronic pain if the condition is not treated properly.  Re-injury if activity is resumed before the injury is allowed to heal completely, or if one resumes improper training habits. TREATMENT Treatment initially involves the use of ice and medication to help reduce pain and inflammation. The use of strengthening and stretching exercises may help reduce pain with activity. These exercises may be performed at home or with a therapist. Individuals who have flat feet may find benefit in wearing arch supports in their shoes. Some individuals find that compression bandages or knee sleeves help reduce symptoms. Your caregiver may recommend a corticosteroid injection to help reduce inflammation. If symptoms persist, despite conservative treatment for greater than 6 months, then surgery may be  recommended.  MEDICATION   If pain medication is necessary, then nonsteroidal anti-inflammatory medications, such as aspirin and ibuprofen, or other minor pain relievers, such as acetaminophen, are often recommended.  Do not take  pain medication for 7 days before surgery.  Prescription pain relievers may be given if deemed necessary by your caregiver. Use only as directed and only as much as you need.  Corticosteroid injections may be given by your caregiver. These injections should be reserved for the most serious cases, because they may only be given a certain number of times. SEEK MEDICAL CARE IF:  Treatment seems to offer no benefit, or the condition worsens.  Any medications produce adverse side effects. EXERCISES  RANGE OF MOTION (ROM) AND STRETCHING EXERCISES - Pes Anserinus Syndrome These exercises may help you when beginning to rehabilitate your injury. Your symptoms may resolve with or without further involvement from your physician, physical therapist or athletic trainer. While completing these exercises, remember:   Restoring tissue flexibility helps normal motion to return to the joints. This allows healthier, less painful movement and activity.  An effective stretch should be held for at least 30 seconds.  A stretch should never be painful. You should only feel a gentle lengthening or release in the stretched tissue. STRETCH  Hamstrings, Supine  Lie on your back. Loop a belt or towel over the ball of your right / left foot.  Straighten your right / left knee and slowly pull on the belt to raise your leg. Do not allow the right / left knee to bend. Keep your opposite leg flat on the floor.  Raise the leg until you feel a gentle stretch behind your right / left knee or thigh. Hold this position for __________ seconds. Repeat __________ times. Complete this stretch __________ times per day.  STRETCH - Hamstrings, Doorway  Lie on your back with your right / left leg extended and resting on the wall and the opposite leg flat on the ground through the door. Initially, position your bottom farther away from the wall than the illustration shows.  Keep your right / left knee straight. If you feel a stretch  behind your knee or thigh, hold this position for __________ seconds.  If you do not feel a stretch, scoot your bottom closer to the door, and hold __________ seconds. Repeat __________ times. Complete this stretch __________ times per day.  STRETCH - Hamstrings/Adductors, V-Sit  Sit on the floor with your legs extended in a large "V," keeping your knees straight.  With your head and chest upright, bend at your waist reaching for your right foot to stretch your left adductors.  You should feel a stretch in your left inner thigh. Hold for __________ seconds.  Return to the upright position to relax your leg muscles.  Continuing to keep your chest upright, bend straight forward at your waist to stretch your hamstrings.  You should feel a stretch behind both of your thighs and/or knees. Hold for __________ seconds.  Return to the upright position to relax your leg muscles.  Repeat steps 2 through 4. Repeat __________ times. Complete this exercise __________ times per day.  STRETCH - Hamstrings, Standing  Stand or sit and extend your right / left leg, placing your foot on a chair or foot stool  Keeping a slight arch in your low back and your hips straight forward.  Lead with your chest and lean forward at the waist until you feel a gentle stretch in the back of your  right / left knee or thigh. (When done correctly, this exercise requires leaning only a small distance.)  Hold this position for __________ seconds. Repeat __________ times. Complete this stretch __________ times per day. STRETCH - Adductors, Lunge  While standing, spread your legs  Lean away from your right / left leg by bending your opposite knee. You may rest your hands on your thigh for balance.  You should feel a stretch in your right / left inner thigh. Hold for __________ seconds. Repeat __________ times. Complete this exercise __________ times per day.  STRETCH - Adductors, Standing  Place your right / left  foot on a counter or stable table. Turn away from your leg so both hips line up with your right / left leg.  Keeping your hips facing forward, slowly bend your opposite leg until you feel a gentle stretch on the inside of your right / left thigh.  Hold for __________ seconds. Repeat __________ times. Complete this exercise __________ times per day.  STRENGTHENING EXERCISES - Pes Anserinus Syndrome  These exercises may help you when beginning to rehabilitate your injury. They may resolve your symptoms with or without further involvement from your physician, physical therapist or athletic trainer. While completing these exercises, remember:   Muscles can gain both the endurance and the strength needed for everyday activities through controlled exercises.  Complete these exercises as instructed by your physician, physical therapist or athletic trainer. Progress the resistance and repetitions only as guided. STRENGTH - Hamstring, Curls  Lay on your stomach with your legs extended. (If you lay on a bed, your feet may hang over the edge.)  Tighten the muscles in the back of your thigh to bend your right / left knee up to 90 degrees. Keep your hips flat on the bed/floor.  Hold this position for __________ seconds.  Slowly lower your leg back to the starting position. Repeat __________ times. Complete this exercise __________ times per day.  OPTIONAL ANKLE WEIGHTS: Begin with ____________________, but DO NOT exceed ____________________. Increase in 1 lb/0.5 kg increments.  STRENGTH - Hip Adductors, Straight Leg Raises  Lie on your side so that your head, shoulders, knee and hip line up. You may place your upper foot in front to help maintain your balance. Your right / left leg should be on the bottom.  Roll your hips slightly forward, so that your hips are stacked directly over each other and your right / left knee is facing forward.  Tense the muscles in your inner thigh and lift your bottom  leg 4-6 inches. Hold this position for __________ seconds.  Slowly lower your leg to the starting position. Allow the muscles to fully relax before beginning the next repetition. Repeat __________ times. Complete this exercise __________ times per day.  Document Released: 08/30/2005 Document Revised: 11/22/2011 Document Reviewed: 12/12/2008 [facility] Patient Information 2014 [place], [place].

## 2013-12-28 NOTE — Progress Notes (Signed)
Pre visit review using our clinic review tool, if applicable. No additional management support is needed unless otherwise documented below in the visit note. 

## 2013-12-28 NOTE — Progress Notes (Signed)
   Subjective:    Patient ID: Nichole [name], female    DOB: Sep 22, 1950, 63 y.o.   MRN: [id number]  Leg Pain  Incident onset: pain ongoing >4 mo, worse in past 2 weeks- There was no injury mechanism. The pain is present in the right knee (medial side R knee). The pain is moderate. The pain has been fluctuating since onset. Pertinent negatives include no inability to bear weight, loss of motion, muscle weakness, numbness or tingling. She reports no foreign bodies present. The symptoms are aggravated by palpation. She has tried nothing for the symptoms. The treatment provided no relief.    Also reviewed chronic medical issues and interval medical events  Past Medical History  Diagnosis Date  . Hyperlipidemia     no current med.  Marland Kitchen History of breast cancer 07/2011    right  . History of chemotherapy 11/2011  . 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 Medial epicondylitis 05/2013    left  . Cancer december 2012    right breast    Review of Systems  Respiratory: Negative for cough and shortness of breath.   Cardiovascular: Negative for chest pain, palpitations and leg swelling.  Musculoskeletal: Positive for gait problem (because of R knee pain). Negative for arthralgias, joint swelling and myalgias.  Neurological: Negative for tingling and numbness.       Objective:   Physical Exam  BP 110/78  Pulse 107  Temp(Src) 97.8 F (36.6 C) (Oral)  Wt 168 lb 12.8 oz (76.567 kg)  SpO2 94% Wt Readings from Last 3 Encounters:  12/28/13 168 lb 12.8 oz (76.567 kg)  07/24/13 168 lb (76.204 kg)  07/09/13 166 lb (75.297 kg)    Constitutional: She appears well-developed and well-nourished. No distress.  Neck: Normal range of motion. Neck supple. No JVD present. No thyromegaly present.  Cardiovascular: Normal rate, regular rhythm and normal heart sounds.  No murmur heard. No BLE edema. Pulmonary/Chest: Effort normal and breath sounds normal. No respiratory distress. She has no  wheezes.  MSkel: R knee without effusion. Full range of motion. Tenderness to palpation over pas anserine on medial lower knee with slight soft tissue swelling over bursa Psychiatric: She has a normal mood and affect. Her behavior is normal. Judgment and thought content normal.   Lab Results  Component Value Date   WBC 7.1 07/17/2013   HGB 13.6 07/17/2013   HCT 41.2 07/17/2013   PLT 252 07/17/2013   GLUCOSE 85 07/17/2013   CHOL 257* 07/17/2013   TRIG 273* 07/17/2013   HDL 56 07/17/2013   LDLDIRECT 127.0 01/16/2013   LDLCALC 146* 07/17/2013   ALT 20 07/17/2013   AST 21 07/17/2013   NA 142 07/17/2013   K 4.1 07/17/2013   CL 110* 01/08/2013   CREATININE 0.7 07/17/2013   BUN 11.4 07/17/2013   CO2 23 07/17/2013   TSH 2.449 05/23/2012    No results found.     Assessment & Plan:   Pes anserine bursitis right knee. Classic findings and history and exam. Offered steroid injection patient declined same at this time. We'll treat conservatively with oral and topical NSAIDs for 10 days, then as needed. Offered home physical therapy exercises strengthening. Patient agrees to call if symptoms worse or unimproved in next 2 weeks for consideration of steroid injection as needed

## 2014-04-02 ENCOUNTER — Encounter: Payer: Self-pay | Admitting: Physician Assistant

## 2014-04-06 ENCOUNTER — Encounter: Payer: Self-pay | Admitting: Oncology

## 2014-04-18 ENCOUNTER — Other Ambulatory Visit: Payer: Self-pay | Admitting: Oncology

## 2014-04-19 ENCOUNTER — Other Ambulatory Visit: Payer: Self-pay | Admitting: *Deleted

## 2014-04-19 DIAGNOSIS — C50911 Malignant neoplasm of unspecified site of right female breast: Secondary | ICD-10-CM

## 2014-04-19 MED ORDER — TAMOXIFEN CITRATE 20 MG PO TABS: 20.0000 mg | ORAL_TABLET | Freq: Every day | ORAL | Status: DC

## 2014-05-23 ENCOUNTER — Encounter: Payer: Self-pay | Admitting: Internal Medicine

## 2014-05-23 DIAGNOSIS — I890 Lymphedema, not elsewhere classified: Secondary | ICD-10-CM

## 2014-05-24 ENCOUNTER — Ambulatory Visit (INDEPENDENT_AMBULATORY_CARE_PROVIDER_SITE_OTHER): Payer: Federal, State, Local not specified - PPO

## 2014-05-24 ENCOUNTER — Encounter: Payer: Self-pay | Admitting: Internal Medicine

## 2014-05-24 ENCOUNTER — Ambulatory Visit (INDEPENDENT_AMBULATORY_CARE_PROVIDER_SITE_OTHER): Payer: Federal, State, Local not specified - PPO | Admitting: Internal Medicine

## 2014-05-24 VITALS — BP 114/88 | HR 96 | Temp 98.1°F | Wt 169.1 lb

## 2014-05-24 DIAGNOSIS — R5381 Other malaise: Secondary | ICD-10-CM

## 2014-05-24 DIAGNOSIS — R7989 Other specified abnormal findings of blood chemistry: Secondary | ICD-10-CM

## 2014-05-24 DIAGNOSIS — R0789 Other chest pain: Secondary | ICD-10-CM | POA: Insufficient documentation

## 2014-05-24 DIAGNOSIS — R5383 Other fatigue: Secondary | ICD-10-CM

## 2014-05-24 DIAGNOSIS — J069 Acute upper respiratory infection, unspecified: Secondary | ICD-10-CM

## 2014-05-24 DIAGNOSIS — G4710 Hypersomnia, unspecified: Secondary | ICD-10-CM

## 2014-05-24 DIAGNOSIS — E785 Hyperlipidemia, unspecified: Secondary | ICD-10-CM

## 2014-05-24 DIAGNOSIS — R079 Chest pain, unspecified: Secondary | ICD-10-CM

## 2014-05-24 LAB — CBC WITH DIFFERENTIAL/PLATELET
BASOS ABS: 0 10*3/uL (ref 0.0–0.1)
BASOS PCT: 0.4 % (ref 0.0–3.0)
EOS ABS: 0.2 10*3/uL (ref 0.0–0.7)
Eosinophils Relative: 2.4 % (ref 0.0–5.0)
HCT: 42.5 % (ref 36.0–46.0)
HEMOGLOBIN: 14.4 g/dL (ref 12.0–15.0)
Lymphocytes Relative: 41.3 % (ref 12.0–46.0)
Lymphs Abs: 4.3 10*3/uL — ABNORMAL HIGH (ref 0.7–4.0)
MCHC: 33.8 g/dL (ref 30.0–36.0)
MCV: 88.7 fl (ref 78.0–100.0)
MONOS PCT: 7.2 % (ref 3.0–12.0)
Monocytes Absolute: 0.7 10*3/uL (ref 0.1–1.0)
NEUTROS ABS: 5.1 10*3/uL (ref 1.4–7.7)
NEUTROS PCT: 48.7 % (ref 43.0–77.0)
Platelets: 277 10*3/uL (ref 150.0–400.0)
RBC: 4.8 Mil/uL (ref 3.87–5.11)
RDW: 12.3 % (ref 11.5–15.5)
WBC: 10.4 10*3/uL (ref 4.0–10.5)

## 2014-05-24 LAB — HEPATIC FUNCTION PANEL
ALBUMIN: 3.7 g/dL (ref 3.5–5.2)
ALT: 41 U/L — AB (ref 0–35)
AST: 33 U/L (ref 0–37)
Alkaline Phosphatase: 67 U/L (ref 39–117)
Bilirubin, Direct: 0.1 mg/dL (ref 0.0–0.3)
TOTAL PROTEIN: 7.2 g/dL (ref 6.0–8.3)
Total Bilirubin: 0.3 mg/dL (ref 0.2–1.2)

## 2014-05-24 LAB — LIPID PANEL
CHOLESTEROL: 266 mg/dL — AB (ref 0–200)
HDL: 50.1 mg/dL (ref >39.00)
NonHDL: 215.9
TRIGLYCERIDES: 258 mg/dL — AB (ref 0.0–149.0)
Total CHOL/HDL Ratio: 5
VLDL: 51.6 mg/dL — ABNORMAL HIGH (ref 0.0–40.0)

## 2014-05-24 LAB — URINALYSIS, ROUTINE W REFLEX MICROSCOPIC
Bilirubin Urine: NEGATIVE
Ketones, ur: NEGATIVE
Leukocytes, UA: NEGATIVE
NITRITE: NEGATIVE
SPECIFIC GRAVITY, URINE: 1.025 (ref 1.000–1.030)
TOTAL PROTEIN, URINE-UPE24: NEGATIVE
URINE GLUCOSE: NEGATIVE
UROBILINOGEN UA: 0.2 (ref 0.0–1.0)
pH: 6 (ref 5.0–8.0)

## 2014-05-24 LAB — BASIC METABOLIC PANEL
BUN: 13 mg/dL (ref 6–23)
CALCIUM: 9.8 mg/dL (ref 8.4–10.5)
CHLORIDE: 103 meq/L (ref 96–112)
CO2: 31 meq/L (ref 19–32)
CREATININE: 0.7 mg/dL (ref 0.4–1.2)
GFR: 97.32 mL/min (ref >60.00)
GLUCOSE: 83 mg/dL (ref 70–99)
Potassium: 4 mEq/L (ref 3.5–5.1)
Sodium: 140 mEq/L (ref 135–145)

## 2014-05-24 LAB — TSH: TSH: 1.83 u[IU]/mL (ref 0.35–4.50)

## 2014-05-24 MED ORDER — AZITHROMYCIN 250 MG PO TABS: ORAL_TABLET | ORAL | Status: DC

## 2014-05-24 NOTE — Assessment & Plan Note (Addendum)
ECG reviewed as per emr, suspect MSK, exam benign,  to f/u any worsening symptoms or concerns

## 2014-05-24 NOTE — Progress Notes (Signed)
Pre visit review using our clinic review tool, if applicable. No additional management support is needed unless otherwise documented below in the visit note. 

## 2014-05-24 NOTE — Progress Notes (Signed)
Subjective:    Patient ID: Nichole [name], female    DOB: [birth year], 63 y.o.   MRN: [id number]  HPI   Here with 2-3 days acute onset fever, facial pain, pressure, headache, general weakness and malaise, and greenish d/c, with mild ST and cough, but pt denies wheezing, increased sob or doe, orthopnea, PND, increased LE swelling, palpitations, dizziness or syncope. Also has marked daytime sleeping., though gets up 0930, most afternoons has to take nap as well.   Has overall gained wt approx 30 lbs since after chemo 2013.  Denies worsening depressive symptoms, suicidal ideation, or panic; has ongoing anxiety, not increased recently.  Pt denies polydipsia, polyuria, Pt states overall good compliance with meds, trying to follow lower cholesterol diet.  Has also had fleeting sharp CP various locations anterior chest lasting seconds, non exertional and nonpleuritic, worse with cough, wondering if she needs cardiology referral. Past Medical History  Diagnosis Date  . Hyperlipidemia     no current med.  Marland Kitchen History of breast cancer 07/2011    right  . History of chemotherapy 11/2011  . 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 Medial epicondylitis 05/2013    left  . Cancer december 2012    right breast   Past Surgical History  Procedure Laterality Date  . Sentinel lymph node biopsy Right 08/2011  . Breast lumpectomy Right 08/2011  . Portacath placement  2013  . Port-a-cath removal  06/2012  . Carpal tunnel release Left 06/19/2013    Procedure: CARPAL TUNNEL RELEASE;  Surgeon: Cammie Sickle., MD;  Location: [place];  Service: Orthopedics;  Laterality: Left;    reports that she quit smoking about 22 years ago. She has never used smokeless tobacco. She reports that she does not drink alcohol or use illicit drugs. family history includes Alzheimer's disease in her mother; Breast cancer in her maternal aunt and maternal aunt; Breast cancer (age of onset: 25) in her cousin;  Colon cancer in her mother; Congestive Heart Failure in her mother; Diabetes in her father and paternal grandmother. Allergies  Allergen Reactions  . Pork-Derived Products Rash   Current Outpatient Prescriptions on File Prior to Visit  Medication Sig Dispense Refill  . diclofenac sodium (VOLTAREN) 1 % GEL Apply 2 g topically 4 (four) times daily.  100 g  0  . nabumetone (RELAFEN) 750 MG tablet Take 1 tablet (750 mg total) by mouth daily.  40 tablet  0  . tamoxifen (NOLVADEX) 20 MG tablet Take 1 tablet (20 mg total) by mouth daily.  30 tablet  1  . UNABLE TO FIND Take 2 tablets by mouth daily. Med Name: Relizen  (OTC for hot flashes)       No current facility-administered medications on file prior to visit.    Review of Systems  Constitutional: Negative for unusual diaphoresis or other sweats  HENT: Negative for ringing in ear Eyes: Negative for double vision or worsening visual disturbance.  Respiratory: Negative for choking and stridor.   Gastrointestinal: Negative for vomiting or other signifcant bowel change Genitourinary: Negative for hematuria or decreased urine volume.  Musculoskeletal: Negative for other MSK pain or swelling Skin: Negative for color change and worsening wound.  Neurological: Negative for tremors and numbness other than noted  Psychiatric/Behavioral: Negative for decreased concentration or agitation other than above       Objective:   Physical Exam BP 114/88  Pulse 96  Temp(Src) 98.1 F (36.7 C) (Oral)  Wt  169 lb 2 oz (76.715 kg)  SpO2 93% VS noted,  Constitutional: Pt appears well-developed, well-nourished.  HENT: Head: NCAT.  Right Ear: External ear normal.  Left Ear: External ear normal.  Bilat tm's with mild erythema.  Max sinus areas non tender.  Pharynx with mild erythema, no exudate Eyes: . Pupils are equal, round, and reactive to light. Conjunctivae and EOM are normal Neck: Normal range of motion. Neck supple.  Cardiovascular: Normal rate and  regular rhythm.   Pulmonary/Chest: Effort normal and breath sounds normal.  Neurological: Pt is alert. Not confused , motor grossly intact Skin: Skin is warm. No rash Psychiatric: Pt behavior is normal. No agitation.     Assessment & Plan:   Wt Readings from Last 3 Encounters:  05/24/14 169 lb 2 oz (76.715 kg)  12/28/13 168 lb 12.8 oz (76.567 kg)  07/24/13 168 lb (76.204 kg)

## 2014-05-24 NOTE — Patient Instructions (Signed)
Please take all new medication as prescribed - the antibiotic  Please continue all other medications as before, and refills have been done if requested.  Please have the pharmacy call with any other refills you may need.  Please keep your appointments with your specialists as you may have planned  You will be contacted regarding the referral for: pulmonary  Please go to the LAB in the Basement (turn left off the elevator) for the tests to be done today  You will be contacted by phone if any changes need to be made immediately.  Otherwise, you will receive a letter about your results with an explanation, but please check with MyChart first.  Please remember to sign up for MyChart if you have not done so, as this will be important to you in the future with finding out test results, communicating by private email, and scheduling acute appointments online when needed.

## 2014-05-25 NOTE — Assessment & Plan Note (Signed)
?   [facility] - for pulm referral 

## 2014-05-25 NOTE — Assessment & Plan Note (Signed)
Mild to mod, for antibx course,  to f/u any worsening symptoms or concerns 

## 2014-05-25 NOTE — Assessment & Plan Note (Signed)
Etiology unclear, Exam otherwise benign, to check labs as documented, follow with expectant management  

## 2014-05-25 NOTE — Assessment & Plan Note (Signed)
Also for lipids, cont low chol diet, f/u labs Lab Results  Component Value Date   LDLCALC 146* 07/17/2013

## 2014-05-27 LAB — LDL CHOLESTEROL, DIRECT: LDL DIRECT: 197 mg/dL

## 2014-05-29 ENCOUNTER — Other Ambulatory Visit: Payer: Self-pay | Admitting: Internal Medicine

## 2014-05-29 MED ORDER — ATORVASTATIN CALCIUM 20 MG PO TABS: 20.0000 mg | ORAL_TABLET | Freq: Every day | ORAL | Status: DC

## 2014-05-30 ENCOUNTER — Encounter: Payer: Self-pay | Admitting: Oncology

## 2014-05-30 ENCOUNTER — Other Ambulatory Visit: Payer: Self-pay | Admitting: *Deleted

## 2014-06-03 ENCOUNTER — Telehealth: Payer: Self-pay

## 2014-06-03 ENCOUNTER — Other Ambulatory Visit: Payer: Self-pay | Admitting: Emergency Medicine

## 2014-06-03 ENCOUNTER — Other Ambulatory Visit: Payer: Federal, State, Local not specified - PPO

## 2014-06-03 DIAGNOSIS — C50919 Malignant neoplasm of unspecified site of unspecified female breast: Secondary | ICD-10-CM

## 2014-06-03 DIAGNOSIS — Z853 Personal history of malignant neoplasm of breast: Secondary | ICD-10-CM

## 2014-06-03 NOTE — Telephone Encounter (Signed)
Let pt know per Dr. Jana Hakim labs not needed today.  Pt voiced understanding.  Lab appt cancelled.

## 2014-06-05 ENCOUNTER — Ambulatory Visit: Payer: Federal, State, Local not specified - PPO | Admitting: Physical Therapy

## 2014-06-10 ENCOUNTER — Ambulatory Visit: Payer: Federal, State, Local not specified - PPO | Attending: Internal Medicine | Admitting: Physical Therapy

## 2014-06-10 ENCOUNTER — Ambulatory Visit
Admission: RE | Admit: 2014-06-10 | Discharge: 2014-06-10 | Disposition: A | Discharge status: Home / Self Care | Payer: Federal, State, Local not specified - PPO | Source: Ambulatory Visit | Attending: Physician Assistant | Admitting: Physician Assistant

## 2014-06-10 DIAGNOSIS — I890 Lymphedema, not elsewhere classified: Secondary | ICD-10-CM | POA: Diagnosis not present

## 2014-06-10 DIAGNOSIS — IMO0001 Reserved for inherently not codable concepts without codable children: Secondary | ICD-10-CM | POA: Diagnosis not present

## 2014-06-10 DIAGNOSIS — Z853 Personal history of malignant neoplasm of breast: Secondary | ICD-10-CM

## 2014-06-11 ENCOUNTER — Ambulatory Visit (HOSPITAL_BASED_OUTPATIENT_CLINIC_OR_DEPARTMENT_OTHER): Payer: Federal, State, Local not specified - PPO | Admitting: Oncology

## 2014-06-11 ENCOUNTER — Telehealth: Payer: Self-pay | Admitting: Oncology

## 2014-06-11 VITALS — BP 125/81 | HR 97 | Temp 98.1°F | Resp 18 | Ht 58.5 in | Wt 170.5 lb

## 2014-06-11 DIAGNOSIS — C50919 Malignant neoplasm of unspecified site of unspecified female breast: Secondary | ICD-10-CM

## 2014-06-11 DIAGNOSIS — Z170 Estrogen receptor positive status [ER+]: Secondary | ICD-10-CM

## 2014-06-11 MED ORDER — ANASTROZOLE 1 MG PO TABS: 1.0000 mg | ORAL_TABLET | Freq: Every day | ORAL | Status: DC

## 2014-06-11 NOTE — Progress Notes (Signed)
ID: Nichole [name]   DOB: [birth year]  MR#: [id number]  MVH#:[id number]  PCP: Gwendolyn Grant, MD GYN: Dr Uvaldo Rising SU:  OTHER MD: Dr. Carol Ada  CHIEF COMPLAINT:  Right Breast Cancer  CURRENT TREATMENT: Anti-estrogens    HISTORY OF PRESENT ILLNESS: From the original intake note:  The patient herself noted a mass in her right breast and brought it to her physician's attention. She was living in [place] at that time. An ultrasound was done, showing a 1.7 cm spiculated nodule in the right breast with associated calcifications. There were no other findings of concern.  Lumpectomy was performed, with reportedly negative margins (I do not have those records). In Dr. Rosendo Gros note 11/04/2011 this tumor is described as triple positive.  The patient moved to [facility] for further treatment, and sentinel lymph node sampling in December of 2012 was negative. The original tissue obtained in [place] was reviewed, showing a 1.6 cm, grade 3 invasive ductal carcinoma. and the prognostic panel obtained in [place] showed the tumor to be estrogen receptor positive, progesterone receptor negative, and HER-2 negative in the invasive tumor (it was positive in the in situ tumor; SCT 12-30 at [facility]). (A note from that report states that HER-2 by [facility] was performed at [facility] and was reportedly positive in the invasive carcinoma. That report is not available to me today.)  The patient completed adjuvant radiation and was reassessed with an [facility] panel, which showed the tumor to be HER-2 negative, with an MIB-1 of 42%, again estrogen receptor positive and progesterone receptor negative. This panel predicted a 43% risk of recurrence at 8 years. The patient also had an Oncotype DX sent. This was again ER positive, PR negative, and HER-2 negative, and predicted a 34% risk of distant recurrence at 10 years if the patient's only adjuvant systemic treatment was  tamoxifen for 5 years. With these numbers Dr. Curlene Labrum discussed adjuvant chemotherapy with the patient, and proposed carboplatin, docetaxel, and Herceptin, which was started on February 2013. On a note from 11/12/2011, Dr. Curlene Labrum states "even though the HER-2-neu is negative, I am still opting for Herceptin based on the aggressiveness of the tumor." The chemotherapy was completed in June 2013. Herceptin was continued, the plan being to complete one year.   The patient's subsequent history is as detailed below.  INTERVAL HISTORY: [name] returns today with her husband [name] for followup of her right breast cancer. The interval history is generally unremarkable. She is taking tamoxifen, with hot flashes as her only significant side effect. She is exercising chiefly by walking, but 30 minutes a day. [name] walks with her.  REVIEW OF SYSTEMS: [name] denies unusual headaches. She has had no significant visual changes. She has had no nausea or vomiting. She has a very keen sense of smell. She cracked a tooth recently and it had to be patch 1 she was in [place]. She denies any cough, phlegm production or pleurisy. Sometimes when she stretches her right arm backwards she feels that tethering under her right breast which is going to be related to her surgery. Has been no change in bowel or bladder habits a detailed review of systems was otherwise stable   PAST MEDICAL HISTORY: Past Medical History  Diagnosis Date  . Hyperlipidemia     no current med.  Marland Kitchen History of breast cancer 07/2011    right  . History of chemotherapy 11/2011  . 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 Medial epicondylitis  05/2013    left  . Cancer december 2012    right breast    PAST SURGICAL HISTORY: Past Surgical History  Procedure Laterality Date  . Sentinel lymph node biopsy Right 08/2011  . Breast lumpectomy Right 08/2011  . Portacath placement  2013  . Port-a-cath removal  06/2012  . Carpal tunnel release Left  06/19/2013    Procedure: CARPAL TUNNEL RELEASE;  Surgeon: Cammie Sickle., MD;  Location: [facility];  Service: Orthopedics;  Laterality: Left;    FAMILY HISTORY Family History  Problem Relation Age of Onset  . Colon cancer Mother     dx postpartum  . Congestive Heart Failure Mother   . Alzheimer's disease Mother   . Diabetes Father   . Breast cancer Cousin 24  . Diabetes Paternal Grandmother   . Breast cancer Maternal Aunt   . Breast cancer Maternal Aunt    The patient's father died at the age of 78. The patient's mother died at the age of 46 after a fall, in the setting of Alzheimer's disease. The patient has no siblings. There are some second-degree relatives with breast cancer. She has been tested for the BRCA 1 and 2 mutations, and was found to be negative.  GYNECOLOGIC HISTORY: Menarche age 27. First live birth age 51. She is GX P4. Last menstrual period 2006. Status post hormone replacement, discontinued November 2012.  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 Her husband, [name], recently retired from the [employer]. This is a second marriage for both of them. Pat's 4 children are Nichole [name], 41, who lives in [place] and works as a Cabin crew; Nichole [name], who works as a Radio broadcast assistant for an Engineer, production firm in [place]; Nichole [name], who works as an Chief Financial Officer in [place]; and the youngest, Nichole [name], 28, who works as a Scientist, clinical (histocompatibility and immunogenetics) in [place]. The patient has 4 grandchildren and one on the way. She is a Nurse, learning disability.  ADVANCED DIRECTIVES: not in place  HEALTH MAINTENANCE:  (Updated November 2014) History  Substance Use Topics  . Smoking status: Former Smoker    Quit date: 05/23/1992  . Smokeless tobacco: Never Used  . Alcohol Use: No     Colonoscopy: Oct 2013/Dr. Hung (Repeat in 2018)  PAP:  Oct 2014, Dr.  Toney Rakes  Bone density:  Lipid panel:  Nov 2014, elevated    Allergies  Allergen Reactions  . Pork-Derived Products Rash    Current Outpatient Prescriptions  Medication Sig Dispense Refill  . atorvastatin (LIPITOR) 20 MG tablet Take 1 tablet (20 mg total) by mouth daily.  90 tablet  3  . azithromycin (ZITHROMAX Z-PAK) 250 MG tablet Use as directed  6 tablet  1  . diclofenac sodium (VOLTAREN) 1 % GEL Apply 2 g topically 4 (four) times daily.  100 g  0  . nabumetone (RELAFEN) 750 MG tablet Take 1 tablet (750 mg total) by mouth daily.  40 tablet  0  . tamoxifen (NOLVADEX) 20 MG tablet Take 1 tablet (20 mg total) by mouth daily.  30 tablet  1  . UNABLE TO FIND Take 2 tablets by mouth daily. Med Name: Relizen  (OTC for hot flashes)       No current facility-administered medications for this visit.    OBJECTIVE: Middle-aged white woman who appears well Filed Vitals:   06/11/14 1118  BP: 125/81  Pulse: 97  Temp: 98.1  F (36.7 C)  Resp: 18     Body mass index is 35.02 kg/(m^2).    ECOG FS: 0 Filed Weights   06/11/14 1118  Weight: 170 lb 8 oz (77.338 kg)   Sclerae unicteric, pupils round and equal Oropharynx clear and mois, teeth in good repair No cervical or supraclavicular adenopathy Lungs no rales or rhonchi Heart regular rate and rhythm Abd soft, obese, nontender, positive bowel sounds MSK no focal spinal tenderness, no upper extremity lymphedema, fair range of motion of the right shoulder Neuro: nonfocal, well oriented, appropriate affect Breasts: The right breast is status post lumpectomy and radiation. There is significant deformity of the profile and palpable scar tissue particularly inferiorly and medially. There is no significant change from baseline. The right axilla is benign. The left breast is unremarkable.   LAB RESULTS: Lab Results  Component Value Date   WBC 10.4 05/24/2014   NEUTROABS 5.1 05/24/2014   HGB 14.4 05/24/2014   HCT 42.5 05/24/2014   MCV 88.7  05/24/2014   PLT 277.0 05/24/2014      Chemistry      Component Value Date/Time   NA 140 05/24/2014 1650   NA 142 07/17/2013 1203   K 4.0 05/24/2014 1650   K 4.1 07/17/2013 1203   CL 103 05/24/2014 1650   CL 110* 01/08/2013 0821   CO2 31 05/24/2014 1650   CO2 23 07/17/2013 1203   BUN 13 05/24/2014 1650   BUN 11.4 07/17/2013 1203   CREATININE 0.7 05/24/2014 1650   CREATININE 0.7 07/17/2013 1203      Component Value Date/Time   CALCIUM 9.8 05/24/2014 1650   CALCIUM 9.4 07/17/2013 1203   ALKPHOS 67 05/24/2014 1650   ALKPHOS 69 07/17/2013 1203   AST 33 05/24/2014 1650   AST 21 07/17/2013 1203   ALT 41* 05/24/2014 1650   ALT 20 07/17/2013 1203   BILITOT 0.3 05/24/2014 1650   BILITOT 0.46 07/17/2013 1203     Lipid Panel     Component Value Date/Time   CHOL 266* 05/24/2014 1650   TRIG 258.0* 05/24/2014 1650   HDL 50.10 05/24/2014 1650   CHOLHDL 5 05/24/2014 1650   VLDL 51.6* 05/24/2014 1650   LDLCALC 146* 07/17/2013 1208      STUDIES: Mm Diag Breast Tomo Bilateral  06/10/2014   CLINICAL DATA:  Patient is an asymptomatic 63 year old female with a personal history of right breast cancer status post conservation therapy in 2013 including lumpectomy, chemotherapy, and radiation.  EXAM: DIGITAL DIAGNOSTIC BILATERAL MAMMOGRAM WITH 3D TOMOSYNTHESIS AND CAD  COMPARISON:  06/01/2013 mammogram  ACR Breast Density Category c: The breast tissue is heterogeneously dense, which may obscure small masses.  FINDINGS: Digital bilateral diagnostic mammography demonstrates a heterogeneously dense parenchymal pattern which may obscure detection of small masses. Postsurgical scarring is again identified in the central right breast at site of lumpectomy. Postsurgical clips are also identified in the right axillary region. The right breast is smaller in size when compared to the left which is an unchanged finding from comparison study following conservation therapy. Spot-compression magnification view of the lumpectomy bed  reveals stable postsurgical scarring and effacement of normal fibroglandular tissue. No new mass or suspicious microcalcification.  Mammographic images were processed with CAD.  IMPRESSION: Post lumpectomy scarring of the right breast without mammographic findings of malignancy.  RECOMMENDATION: Diagnostic mammogram is suggested in 1 year. (Code:DM-B-01Y)  I have discussed the findings and recommendations with the patient. Results were also provided in writing at the conclusion  of the visit. If applicable, a reminder letter will be sent to the patient regarding the next appointment.  BI-RADS CATEGORY  2: Benign.   Electronically Signed   By: Andres Shad   On: 06/10/2014 11:38    ASSESSMENT: 63 y.o. BRCA negative [place] woman recently moved to [facility], s/p Right lumpectomy December 2012 for a pT1c pN0, stage 1A invasive ductal carcinoma, grade 3, estrogen receptor positive, progesterone receptor negative, with a Ki-67 of 42%  (1) Oncotype DX recurrence score 54% predicting a 10-year risk of distant recurrence of 34% if the only adjuvant systemic treatment is tamoxifen x 5 years  (1) s/p adjuvant radiation completed February 2013  (2) s/p six cycles of carboplatin/ docetaxel/ trastuzumab completed June 2012  (3) started tamoxifen 05/14/2012, discontinued September 2015  (4) to start anastrozole 08/13/2014  (5)  Status post endometrial biopsy in October 2014 for a thickened endometrial lining, pathology benign   PLAN:  We spent approximately 40 minutes going over her general situation. She understands to get the full benefit from tamoxifen she would have to take another 8 years. If she switches to anastrozole today she can stop taking antiestrogen 2 years from now.  We then discussed the possible toxicities, side effects and complications of anastrozole.  She is interested in making the switch. She will stop tamoxifen now. She will start anastrozole after a "washout." Namely on December  for a 15. She will then see me in mid February. If she is tolerating her anastrozole well at that point the plan will be to continue that for total of 3 years and then discontinue followup here.  She is interested in losing weight and we discussed diet and exercise programs. I gave her a copy of the [employer] pamphlet to call and find out when she can begin to participate. We also discussed vaginal dryness issues and I gave her information on the "intimacy and pelvic health" teaching session.  Pattern is good understanding of the overall plan. She agrees with it. She knows the goal of treatment in her case is cure. She will call with any problems that may develop before the next visit here.  Chauncey Cruel, MD     06/11/2014

## 2014-06-11 NOTE — Telephone Encounter (Signed)
per pof to sch pt appt-gave pt copy p

## 2014-06-11 NOTE — Addendum Note (Signed)
Addended by: Laureen Abrahams on: 06/11/2014 05:41 PM   Modules accepted: Orders

## 2014-06-18 ENCOUNTER — Ambulatory Visit: Payer: Federal, State, Local not specified - PPO | Admitting: Physical Therapy

## 2014-06-19 ENCOUNTER — Other Ambulatory Visit: Payer: Self-pay | Admitting: *Deleted

## 2014-06-19 ENCOUNTER — Telehealth: Payer: Self-pay | Admitting: *Deleted

## 2014-06-19 NOTE — Telephone Encounter (Signed)
[name] with [facility] [address]. called reporting they have not received a response to their tamoxifen refill request.  Called patient for clarification of desire to stop tamoxifen and begin anastrozole in December.  Message left awaiting return call.

## 2014-06-20 ENCOUNTER — Other Ambulatory Visit: Payer: Self-pay | Admitting: *Deleted

## 2014-06-20 ENCOUNTER — Ambulatory Visit: Payer: Federal, State, Local not specified - PPO | Attending: Internal Medicine | Admitting: Physical Therapy

## 2014-06-20 DIAGNOSIS — Z5189 Encounter for other specified aftercare: Secondary | ICD-10-CM | POA: Insufficient documentation

## 2014-06-20 DIAGNOSIS — C50911 Malignant neoplasm of unspecified site of right female breast: Secondary | ICD-10-CM

## 2014-06-20 DIAGNOSIS — I890 Lymphedema, not elsewhere classified: Secondary | ICD-10-CM | POA: Insufficient documentation

## 2014-06-20 MED ORDER — ANASTROZOLE 1 MG PO TABS: 1.0000 mg | ORAL_TABLET | Freq: Every day | ORAL | Status: DC

## 2014-06-24 ENCOUNTER — Ambulatory Visit: Payer: Federal, State, Local not specified - PPO | Admitting: Physical Therapy

## 2014-06-24 DIAGNOSIS — Z5189 Encounter for other specified aftercare: Secondary | ICD-10-CM | POA: Diagnosis not present

## 2014-06-25 ENCOUNTER — Encounter: Payer: Federal, State, Local not specified - PPO | Admitting: Physical Therapy

## 2014-06-27 ENCOUNTER — Ambulatory Visit: Payer: Federal, State, Local not specified - PPO | Admitting: Physical Therapy

## 2014-07-02 ENCOUNTER — Ambulatory Visit: Payer: Federal, State, Local not specified - PPO | Admitting: Physical Therapy

## 2014-07-02 DIAGNOSIS — Z5189 Encounter for other specified aftercare: Secondary | ICD-10-CM | POA: Diagnosis not present

## 2014-07-04 ENCOUNTER — Ambulatory Visit: Payer: Federal, State, Local not specified - PPO | Admitting: Physical Therapy

## 2014-07-08 ENCOUNTER — Ambulatory Visit: Payer: Federal, State, Local not specified - PPO | Admitting: Physical Therapy

## 2014-07-08 DIAGNOSIS — Z5189 Encounter for other specified aftercare: Secondary | ICD-10-CM | POA: Diagnosis not present

## 2014-07-10 ENCOUNTER — Encounter: Payer: Self-pay | Admitting: Gynecology

## 2014-07-10 ENCOUNTER — Ambulatory Visit (INDEPENDENT_AMBULATORY_CARE_PROVIDER_SITE_OTHER): Payer: Federal, State, Local not specified - PPO | Admitting: Gynecology

## 2014-07-10 VITALS — BP 120/82 | Ht <= 58 in | Wt 171.0 lb

## 2014-07-10 DIAGNOSIS — R934 Abnormal findings on diagnostic imaging of urinary organs: Secondary | ICD-10-CM

## 2014-07-10 DIAGNOSIS — N393 Stress incontinence (female) (male): Secondary | ICD-10-CM

## 2014-07-10 DIAGNOSIS — Z01419 Encounter for gynecological examination (general) (routine) without abnormal findings: Secondary | ICD-10-CM

## 2014-07-10 DIAGNOSIS — M8580 Other specified disorders of bone density and structure, unspecified site: Secondary | ICD-10-CM

## 2014-07-10 DIAGNOSIS — Z853 Personal history of malignant neoplasm of breast: Secondary | ICD-10-CM

## 2014-07-10 DIAGNOSIS — R9389 Abnormal findings on diagnostic imaging of other specified body structures: Secondary | ICD-10-CM

## 2014-07-10 NOTE — Patient Instructions (Addendum)
Shingles Vaccine What You Need to Know WHAT IS SHINGLES?  Shingles is a painful skin rash, often with blisters. It is also called Herpes Zoster or just Zoster.  A shingles rash usually appears on one side of the face or body and lasts from 2 to 4 weeks. Its main symptom is pain, which can be quite severe. Other symptoms of shingles can include fever, headache, chills, and upset stomach. Very rarely, a shingles infection can lead to pneumonia, hearing problems, blindness, brain inflammation (encephalitis), or death.  For about 1 person in 5, severe pain can continue even after the rash clears up. This is called post-herpetic neuralgia.  Shingles is caused by the Varicella Zoster virus. This is the same virus that causes chickenpox. Only someone who has had a case of chickenpox or rarely, has gotten chickenpox vaccine, can get shingles. The virus stays in your body. It can reappear many years later to cause a case of shingles.  You cannot catch shingles from another person with shingles. However, a person who has never had chickenpox (or chickenpox vaccine) could get chickenpox from someone with shingles. This is not very common.  Shingles is far more common in people 50 and older than in younger people. It is also more common in people whose immune systems are weakened because of a disease such as cancer or drugs such as steroids or chemotherapy.  At least 1 million people get shingles per year in the [place]. SHINGLES VACCINE  A vaccine for shingles was licensed in 2006. In clinical trials, the vaccine reduced the risk of shingles by 50%. It can also reduce the pain in people who still get shingles after being vaccinated.  A single dose of shingles vaccine is recommended for adults 60 years of age and older. SOME PEOPLE SHOULD NOT GET SHINGLES VACCINE OR SHOULD WAIT A person should not get shingles vaccine if he or she:  Has ever had a life-threatening allergic reaction to gelatin, the  antibiotic neomycin, or any other component of shingles vaccine. Tell your caregiver if you have any severe allergies.  Has a weakened immune system because of current:  AIDS or another disease that affects the immune system.  Treatment with drugs that affect the immune system, such as prolonged use of high-dose steroids.  Cancer treatment, such as radiation or chemotherapy.  Cancer affecting the bone marrow or lymphatic system, such as leukemia or lymphoma.  Is pregnant, or might be pregnant. Women should not become pregnant until at least 4 weeks after getting shingles vaccine. Someone with a minor illness, such as a cold, may be vaccinated. Anyone with a moderate or severe acute illness should usually wait until he or she recovers before getting the vaccine. This includes anyone with a temperature of 101.3 F (38 C) or higher. WHAT ARE THE RISKS FROM SHINGLES VACCINE?  A vaccine, like any medicine, could possibly cause serious problems, such as severe allergic reactions. However, the risk of a vaccine causing serious harm, or death, is extremely small.  No serious problems have been identified with shingles vaccine. Mild Problems  Redness, soreness, swelling, or itching at the site of the injection (about 1 person in 3).  Headache (about 1 person in 70). Like all vaccines, shingles vaccine is being closely monitored for unusual or severe problems. WHAT IF THERE IS A MODERATE OR SEVERE REACTION? What should I look for? Any unusual condition, such as a severe allergic reaction or a high fever. If a severe allergic reaction   occurred, it would be within a few minutes to an hour after the shot. Signs of a serious allergic reaction can include difficulty breathing, weakness, hoarseness or wheezing, a fast heartbeat, hives, dizziness, paleness, or swelling of the throat. What should I do?  Call your caregiver, or get the person to a caregiver right away.  Tell the caregiver what  happened, the date and time it happened, and when the vaccination was given.  Ask the caregiver to report the reaction by filing a Vaccine Adverse Event Reporting System (VAERS) form. Or, you can file this report through the VAERS web site at www.vaers.SamedayNews.es or by calling 718-465-9624. VAERS does not provide medical advice. HOW CAN I LEARN MORE?  Ask your caregiver. He or she can give you the vaccine package insert or suggest other sources of information.  Contact the Centers for Disease Control and Prevention (CDC):  Call 410 548 1259 (1-800-CDC-INFO).  Visit the CDC website at http://hunter.com/ CDC Shingles Vaccine VIS (06/18/08) Document Released: 06/27/2006 Document Revised: 11/22/2011 Document Reviewed: 12/20/2012 [facility] Patient Information 2015 [place]. This information is not intended to replace advice given to you by your health care provider. Make sure you discuss any questions you have with your health care provider. Ejercicios de Kegel  Barrister's clerk) El objetivo de los ejercicios de Kegel es aislar y Chief Technology Officer los msculos del suelo plvico. Estos msculos actan como una hamaca que soporta el recto, la vagina, el intestino delgado y [place]. A medida que los msculos se debilitan, se hunden y esos rganos son desplazados de sus posiciones normales. Los ejercicios de Kegel fortalecen los msculos del suelo plvico y ayudan a Garment/textile technologist control de la vejiga y del intestino, mejoran la respuesta sexual y Therapist, music a Proofreader problemas y Dispensing optician durante el [place]. Se pueden hacer en cualquier lugar y en cualquier momento.  [place] LOS EJERCICIOS DE KEGEL  1. Localice los msculos del suelo plvico. Para ello apriete (contraiga) los msculos que se utilizan para tratar de Scientist, water quality el flujo de [place]. Usted se sentir una opresin en el rea vaginal (mujeres) y que se eleva y comprime la zona rectal (hombres y mujeres). 2. Para comenzar,  contraiga los msculos plvicos durante 2 a 5 segundos y despus reljelos durante 2 a 5 segundos. Esta es una serie. Repita 4 a 5 series con una breve pausa en el medio. 3. Contraiga los msculos de la pelvis durante 8 a 10 segundos y despus reljelos durante 8 a 10 segundos. Repita 4 a 5 series. Si no puede contraer los msculos de la pelvis durante 8 a 10 segundos, trate de 5 a 7 segundos y Higher education careers adviser 8 a 10 segundos. Su objetivo es Optometrist 4 a 5 series de 10 Passenger transport manager. Mantenga el estmago, las nalgas y las piernas relajadas [place]. Realice ambas series de contracciones, cortas y largas. [place] posiciones. [place] contracciones 3 a 4 veces por Training and development officer. [place] series mientras est:    Acostado en la cama por la [place].  De pie en el almuerzo.  Sentado por las tardes.  Acostado en la cama por la noche.  Debe hacer 40 a 50 contracciones por da. No realice ms ejercicios de Kegel por da que lo recomendado. El exceso de ejercicio puede causar fatiga muscular. Contine con estos ejercicios durante al menos 15 a 20 semanas o segn las indicaciones de su mdico.  Document Released: 08/16/2012 [facility] Patient Information 2015 [place], [place]. This information is not  intended to replace advice given to you by your health care provider. Make sure you discuss any questions you have with your health care provider.

## 2014-07-10 NOTE — Progress Notes (Signed)
Nichole [name] 08/04/1951 [id number]   History:    63 y.o.  for annual gyn exam with complaints of nocturia occasional stress urinary incontinence. 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 Patient is status post adjuvant radiation treatment which was completed in February of 2013. She also completed in June 2012 six cycles of carboplatin/ docetaxel/ trastuzumab.  She was on Herceptin for a short while after questions were raised in reference to her HER-2 status which was believed to be negative. She was taken off the tamoxifen in the next few months she will be taking anastrozole 1 mg daily.Patient was also BRCA negative. For vasomotor symptoms she was on Effexor 37.5 mg but she discontinued because she noticed it wasn't helping her much. Patient is now taking Relizen one tablet twice a day and it has decreased her vasomotor symptoms to a certain extent.  She had an ultrasound and office on 07/18/2013 which demonstrated the following: Her ultrasound today demonstrated a uterus measured 8.6 x 4.7 x 4.1 cm with endometrial stripe of 7.8 mm both ovaries were atrophic otherwise but had had a homogeneous echo uterine pattern with prominent endometrium with cystic areas. Because of these findings it was recommended the patient undergo an endometrial biopsy to rule out endometrial hyperplasia since she was on tamoxifen. Patient denied any vaginal bleeding.   Her pathology report demonstrated the following: Diagnosis Endometrium, biopsy - SUPERFICIAL FRAGMENTS OF BENIGN WEAKLY PROLIFERATIVE ENDOMETRIUM, NO ATYPIA, HYPERPLASIA OR MALIGNANCY.  Mother diagnosed with colon cancer). Patient herself had history of colon polyps in the past. Patient stated many years ago she had cryotherapy as well  as either laser or LEEP conization but subsequent Pap smears have been negative. She also stated that in [place] far she had a normal bone density study in January of 2013 which was reportedly normal.  Patient reports no vaginal bleeding. Her PCP has been doing her blood work. Patient has not had her shingles vaccine yet. Her to get vaccine is up-to-date. Patient declined flu vaccine today.  Past medical history,surgical history, family history and social history were all reviewed and documented in the EPIC chart.  Gynecologic History No LMP recorded. Patient is postmenopausal. Contraception: post menopausal status Last Pap: 2014. Results were: normal Last mammogram: 2015. Results were: normal  Obstetric History OB History  Gravida Para Term Preterm AB SAB TAB Ectopic Multiple Living  $Remov'4 4 4       4    'vaIwlY$ # Outcome Date GA Lbr Len/2nd Weight Sex Delivery Anes PTL Lv  4 TRM     M CS  N Y  3 TRM     M SVD  N Y  2 TRM     M SVD  N Y  1 TRM     M SVD  N Y       ROS: A ROS was performed and pertinent positives and negatives are included in the history.  GENERAL: No fevers or chills. HEENT: No change in vision, no earache, sore throat or sinus congestion. NECK: No pain or stiffness. CARDIOVASCULAR: No chest pain or pressure. No palpitations. PULMONARY: No shortness of breath, cough or wheeze. GASTROINTESTINAL: No abdominal pain, nausea, vomiting or diarrhea, melena or bright red blood per rectum. GENITOURINARY: No urinary frequency, urgency, hesitancy or dysuria. MUSCULOSKELETAL:  No joint or muscle pain, no back pain, no recent trauma. DERMATOLOGIC: No rash, no itching, no lesions. ENDOCRINE: No polyuria, polydipsia, no heat or cold intolerance. No recent change in weight. HEMATOLOGICAL: No anemia or easy bruising or bleeding. NEUROLOGIC: No headache, seizures, numbness, tingling or weakness. PSYCHIATRIC: No depression, no loss of interest in normal activity or change in sleep pattern.      Exam: chaperone present  BP 120/82  Ht 4' 9.5" (1.461 m)  Wt 171 lb (77.565 kg)  BMI 36.34 kg/m2  Body mass index is 36.34 kg/(m^2).  General appearance : Well developed well nourished female. No acute distress HEENT: Neck supple, trachea midline, no carotid bruits, no thyroidmegaly Lungs: Clear to auscultation, no rhonchi or wheezes, or rib retractions  Heart: Regular rate and rhythm, no murmurs or gallops Breast:Examined in sitting and supine position were symmetrical in appearance, no palpable masses or tenderness,  no skin retraction, no nipple inversion, no nipple discharge, no skin discoloration, no axillary or supraclavicular lymphadenopathy Abdomen: no palpable masses or tenderness, no rebound or guarding Extremities: no edema or skin discoloration or tenderness  Pelvic:  Bartholin, Urethra, Skene Glands: Within normal limits             Vagina: No gross lesions or discharge atrophic changes  Cervix: No gross lesions or discharge  Uterus  anteverted, normal size, shape and consistency, non-tender and mobile  Adnexa  Without masses or tenderness  Anus and perineum  normal   Rectovaginal  normal sphincter tone without palpated masses or tenderness             Hemoccult cards provided   Q-tip angle tests less than 30. On Valsalva patient had no leakage of urine  Assessment/Plan:  63 y.o. female with mild stress urinary incontinence. No evidence of cystocele or rectocele. Q-tip angle tests less than 30. Patient was provided with literature information on Ke[place] exercises. Patient will return back to the office in the next week for follow-up on endometrial stripe. She will also schedule her bone density study. She was given a prescription for her to go her local pharmacy for her shingles vaccine. Her PCP will be drawing her blood work. She will follow-up with  medical oncologist next year. Pap smear not done today. Patient was submitted to the office the fecal Hemoccult cards  for testing at a later date. We discussed importance of monthly breast exam. We discussed importance of calcium vitamin D and regular exercise for osteoporosis prevention.   Terrance Mass MD, 2:00 PM 07/10/2014

## 2014-07-11 ENCOUNTER — Ambulatory Visit: Payer: Federal, State, Local not specified - PPO

## 2014-07-11 DIAGNOSIS — Z5189 Encounter for other specified aftercare: Secondary | ICD-10-CM | POA: Diagnosis not present

## 2014-07-15 ENCOUNTER — Encounter: Payer: Self-pay | Admitting: Gynecology

## 2014-07-24 ENCOUNTER — Ambulatory Visit (INDEPENDENT_AMBULATORY_CARE_PROVIDER_SITE_OTHER): Payer: Federal, State, Local not specified - PPO | Admitting: Gynecology

## 2014-07-24 ENCOUNTER — Encounter: Payer: Self-pay | Admitting: Gynecology

## 2014-07-24 ENCOUNTER — Other Ambulatory Visit: Payer: Self-pay | Admitting: Gynecology

## 2014-07-24 ENCOUNTER — Ambulatory Visit (INDEPENDENT_AMBULATORY_CARE_PROVIDER_SITE_OTHER): Payer: Federal, State, Local not specified - PPO

## 2014-07-24 VITALS — BP 130/86

## 2014-07-24 DIAGNOSIS — N83319 Acquired atrophy of ovary, unspecified side: Secondary | ICD-10-CM

## 2014-07-24 DIAGNOSIS — Z853 Personal history of malignant neoplasm of breast: Secondary | ICD-10-CM

## 2014-07-24 DIAGNOSIS — R934 Abnormal findings on diagnostic imaging of urinary organs: Secondary | ICD-10-CM

## 2014-07-24 DIAGNOSIS — R9389 Abnormal findings on diagnostic imaging of other specified body structures: Secondary | ICD-10-CM

## 2014-07-24 DIAGNOSIS — Z79811 Long term (current) use of aromatase inhibitors: Secondary | ICD-10-CM

## 2014-07-24 DIAGNOSIS — N8331 Acquired atrophy of ovary: Secondary | ICD-10-CM

## 2014-07-24 NOTE — Patient Instructions (Signed)
Biopsia de endometrio (Endometrial Biopsy) La biopsia de endometrio es un procedimiento en el que se toma una [place] de tejido del tero. Luego la muestra de tejido se observa en el microscopio para ver si el tejido es normal o anormal. El endometrio es el revestimiento interno del tero. Este procedimiento ayuda a determinar si est en el ciclo menstrual y de que modo los niveles de hormonas afectan el revestimiento del tero. Este procedimiento tambin se [place] para evaluar el sangrado uterino o para Electrical engineer de endometrio, tuberculosis, plipos o enfermedades inflamatorias.  INFORME A SU MDICO:  Cualquier alergia que tenga.  Todos los [employer] [place], incluyendo vitaminas, hierbas, gotas oftlmicas, cremas y medicamentos de venta libre.  Problemas previos que usted o los [employer] de su familia hayan tenido con el uso de anestsicos.  Enfermedades de [employer].  Cirugas previas.  Padecimientos mdicos.  Posible embarazo. RIESGOS Y COMPLICACIONES Generalmente es un procedimiento seguro. Sin embargo, Games developer procedimiento, pueden surgir complicaciones. Las complicaciones posibles son:  Hemorragias.  Infecciones plvicas  Lesin en la pared del tero con el instrumento utilizado para tomar la biopsia (raro). ANTES DEL PROCEDIMIENTO   Lleve un registro de sus ciclos menstruales segn las indicaciones de su mdico. Puede ser necesario que programe el procedimiento para un momento especfico del ciclo menstrual.  Tendr que llevar un apsito sanitario para usar despus del procedimiento.  Pdale a alguna persona que la lleve a su casa despus del procedimiento si le dan un medicamento para relajarse (sedante). PROCEDIMIENTO   Le podrn administrar un medicamento para relajarse.  Deber recostarse en una camilla con los pies y las piernas elevados, como en el examen plvico.  El mdico insertar un instrumento (espculo) en la vagina para observar  el cuello del tero.  El cuello del tero ser desinfectado con una solucin antisptica. Para adormecer el cuello del tero le aplicarn un medicamento (anestsico local).  Se utilizar un frceps (tenculo) para [employer].  Se insertar un instrumento delgado, similar a una varilla (sonda uterina) a travs del cuello del tero para Teacher, adult education su longitud y la ubicacin en la que ser tomada la muestra para la biopsia.  Luego se pasa un tubo delgado y flexible (catter) a travs del cuello del tero hasta el tero. El catter se [place] para Barista de tejido del endometrio para la biopsia.  El catter y el espculo se retirarn y la [place] se enviar al laboratorio para ser examinada. DESPUS DEL PROCEDIMIENTO  Descansar en una sala de recuperacin hasta que est lista para volver a su casa.  Sentir clicos leves y tendr una pequea cantidad de sangrado vaginal durante algunos das despus del procedimiento. Esto es normal.  Asegrese de [employer]. Document Released: 05/02/2013 Document Revised: 09/04/2013 [facility] Patient Information 2015 [place]. This information is not intended to replace advice given to you by your health care provider. Make sure you discuss any questions you have with your health care provider.

## 2014-07-24 NOTE — Progress Notes (Signed)
Patient presented to the office today to discuss her ultrasound. 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 Patient is status post adjuvant radiation treatment which was completed in February of 2013. She also completed in June 2012 six cycles of carboplatin/ docetaxel/ trastuzumab. She was on Herceptin for a short while after questions were raised in reference to her HER-2 status which was believed to be negative. She was taken off the tamoxifen in the next few months she will be taking anastrozole 1 mg daily.Patient was also BRCA negative.  Ultrasound today: Uterus measured 8.1 x 5.1 x 3.9 cm with endometrial stripe of 9.2 mm. Her thickened endometrium was avascular. Homogeneous echo pattern. Right ovary located above the uterus was normal left ovary was normal. No fluid in the cul-de-sac.  Last year her ultrasound indicated that her endometrial stripe was 7.8 mm Y she was on tamoxifen and reported no vaginal bleeding. She underwent an endometrial biopsy which demonstrated the following result:  Endometrium, biopsy - SUPERFICIAL FRAGMENTS OF BENIGN WEAKLY PROLIFERATIVE ENDOMETRIUM, NO ATYPIA, HYPERPLASIA OR MALIGNANCY  Concerns were raised today because of the thickness of her endometrium and I had recommended to do an endometrial biopsy on that she was having no vaginal bleeding. The cervix was cleansed with Betadine solution. A single-tooth tenaculum was placed on the anterior cervical lip. A sterile Pipelle was introduced into the uterine cavity after a paracervical block with 1% lidocaine for a total 5 cc at been placed on the cervix. Tissue obtained although minimal was submitted for histological evaluation.  Assessment/plan: Postmenopausal patient has been off tamoxifen for 2 months no vaginal  bleeding with past history of breast cancer is going to start anastrozole in December. Due to thickness of the endometrium greater than last year endometrial biopsy was done today result pending at time of this dictation.

## 2014-07-29 ENCOUNTER — Institutional Professional Consult (permissible substitution): Payer: Federal, State, Local not specified - PPO | Admitting: Pulmonary Disease

## 2014-08-06 ENCOUNTER — Ambulatory Visit (INDEPENDENT_AMBULATORY_CARE_PROVIDER_SITE_OTHER): Payer: Federal, State, Local not specified - PPO

## 2014-08-06 DIAGNOSIS — M8580 Other specified disorders of bone density and structure, unspecified site: Secondary | ICD-10-CM

## 2014-08-13 ENCOUNTER — Other Ambulatory Visit: Payer: Federal, State, Local not specified - PPO | Admitting: Anesthesiology

## 2014-08-13 DIAGNOSIS — Z1211 Encounter for screening for malignant neoplasm of colon: Secondary | ICD-10-CM

## 2014-10-29 ENCOUNTER — Ambulatory Visit (HOSPITAL_BASED_OUTPATIENT_CLINIC_OR_DEPARTMENT_OTHER): Payer: Federal, State, Local not specified - PPO

## 2014-10-29 ENCOUNTER — Telehealth: Payer: Self-pay | Admitting: Oncology

## 2014-10-29 ENCOUNTER — Ambulatory Visit (HOSPITAL_BASED_OUTPATIENT_CLINIC_OR_DEPARTMENT_OTHER): Payer: Federal, State, Local not specified - PPO | Admitting: Oncology

## 2014-10-29 VITALS — BP 107/71 | HR 98 | Temp 98.9°F | Resp 18 | Ht <= 58 in | Wt 172.0 lb

## 2014-10-29 DIAGNOSIS — C50911 Malignant neoplasm of unspecified site of right female breast: Secondary | ICD-10-CM

## 2014-10-29 DIAGNOSIS — C50919 Malignant neoplasm of unspecified site of unspecified female breast: Secondary | ICD-10-CM

## 2014-10-29 DIAGNOSIS — M8580 Other specified disorders of bone density and structure, unspecified site: Secondary | ICD-10-CM

## 2014-10-29 DIAGNOSIS — Z853 Personal history of malignant neoplasm of breast: Secondary | ICD-10-CM

## 2014-10-29 LAB — CBC WITH DIFFERENTIAL/PLATELET
BASO%: 0.3 % (ref 0.0–2.0)
Basophils Absolute: 0 10*3/uL (ref 0.0–0.1)
EOS%: 3.1 % (ref 0.0–7.0)
Eosinophils Absolute: 0.3 10*3/uL (ref 0.0–0.5)
HCT: 43.3 % (ref 34.8–46.6)
HGB: 13.7 g/dL (ref 11.6–15.9)
LYMPH%: 38.8 % (ref 14.0–49.7)
MCH: 28.2 pg (ref 25.1–34.0)
MCHC: 31.7 g/dL (ref 31.5–36.0)
MCV: 89.1 fL (ref 79.5–101.0)
MONO#: 0.6 10*3/uL (ref 0.1–0.9)
MONO%: 7.6 % (ref 0.0–14.0)
NEUT#: 4.1 10*3/uL (ref 1.5–6.5)
NEUT%: 50.2 % (ref 38.4–76.8)
Platelets: 239 10*3/uL (ref 145–400)
RBC: 4.86 10*6/uL (ref 3.70–5.45)
RDW: 13.4 % (ref 11.2–14.5)
WBC: 8.2 10*3/uL (ref 3.9–10.3)
lymph#: 3.2 10*3/uL (ref 0.9–3.3)

## 2014-10-29 LAB — COMPREHENSIVE METABOLIC PANEL (CC13)
ALBUMIN: 3.7 g/dL (ref 3.5–5.0)
ALT: 32 U/L (ref 0–55)
AST: 27 U/L (ref 5–34)
Alkaline Phosphatase: 94 U/L (ref 40–150)
Anion Gap: 11 mEq/L (ref 3–11)
BUN: 12.9 mg/dL (ref 7.0–26.0)
CO2: 28 mEq/L (ref 22–29)
CREATININE: 0.7 mg/dL (ref 0.6–1.1)
Calcium: 9.8 mg/dL (ref 8.4–10.4)
Chloride: 106 mEq/L (ref 98–109)
EGFR: 85 mL/min/{1.73_m2} — ABNORMAL LOW (ref >90)
GLUCOSE: 104 mg/dL (ref 70–140)
Potassium: 4 mEq/L (ref 3.5–5.1)
Sodium: 146 mEq/L — ABNORMAL HIGH (ref 136–145)
Total Bilirubin: 0.61 mg/dL (ref 0.20–1.20)
Total Protein: 6.6 g/dL (ref 6.4–8.3)

## 2014-10-29 NOTE — Progress Notes (Signed)
ID: Lonni Fix   DOB: 20-Sep-1950  MR#: [id number]  HUT#:[id number]  PCP: Gwendolyn Grant, MD GYN: Dr Uvaldo Rising SU:  OTHER MD: Dr. Carol Ada  CHIEF COMPLAINT:  Right Breast Cancer  CURRENT TREATMENT: Anastrozole    HISTORY OF PRESENT ILLNESS: From the original intake note:  The patient herself noted a mass in her right breast and brought it to her physician's attention. She was living in [place] at that time. An ultrasound was done, showing a 1.7 cm spiculated nodule in the right breast with associated calcifications. There were no other findings of concern.  Lumpectomy was performed, with reportedly negative margins (I do not have those records). In Dr. Rosendo Gros note 11/04/2011 this tumor is described as triple positive.  The patient moved to [facility] for further treatment, and sentinel lymph node sampling in December of 2012 was negative. The original tissue obtained in [place] was reviewed, showing a 1.6 cm, grade 3 invasive ductal carcinoma. and the prognostic panel obtained in [place] showed the tumor to be estrogen receptor positive, progesterone receptor negative, and HER-2 negative in the invasive tumor (it was positive in the in situ tumor; SCT 12-30 at [facility]). (A note from that report states that HER-2 by [facility] was performed at [facility] and was reportedly positive in the invasive carcinoma. That report is not available to me today.)  The patient completed adjuvant radiation and was reassessed with an [facility] panel, which showed the tumor to be HER-2 negative, with an MIB-1 of 42%, again estrogen receptor positive and progesterone receptor negative. This panel predicted a 43% risk of recurrence at 8 years. The patient also had an Oncotype DX sent. This was again ER positive, PR negative, and HER-2 negative, and predicted a 34% risk of distant recurrence at 10 years if the patient's only adjuvant systemic treatment was tamoxifen  for 5 years. With these numbers Dr. Curlene Labrum discussed adjuvant chemotherapy with the patient, and proposed carboplatin, docetaxel, and Herceptin, which was started on February 2013. On a note from 11/12/2011, Dr. Curlene Labrum states "even though the HER-2-neu is negative, I am still opting for Herceptin based on the aggressiveness of the tumor." The chemotherapy was completed in June 2013. Herceptin was continued, the plan being to complete one year.   The patient's subsequent history is as detailed below.  INTERVAL HISTORY: Nichole [name] returns today for followup of her right breast cancer accompanied by her husband Nichole [name]. Pat started anastrozole 12/01/2o15. She has not noted an increase in her baseline hot flashes or vaginal dryness. She does have some vaginal dryness, with minimal dyspareunia. Again this is not new or more intense than before. She had a bone density in November which showed a T score of -1.8  REVIEW OF SYSTEMS: Nichole [name] and Nichole [name] just returned from a trip to [place] and [place]. He is father recently died. Even though this was expected, it has been a stress on the family reviewed with all the traveling pad developed a significant upper respiratory infection and was just treated with 10 days of antibiotics. She still has a little bit of a tickle in her throat. She denies shortness of breath, pleurisy, or hemoptysis. She has some discomfort in the rib cage under the right breast, which has been present for a long time and is very intermittent and mild. She also has an irregularity under the left breast, which makes her "asymmetrical". She wanted me to make sure to review that today. A detailed review of systems  today was otherwise noncontributory   PAST MEDICAL HISTORY: Past Medical History  Diagnosis Date  . Hyperlipidemia     no current med.  Marland Kitchen History of breast cancer 07/2011    right  . History of chemotherapy 11/2011  . 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 Medial epicondylitis  05/2013    left  . Cancer december 2012    right breast    PAST SURGICAL HISTORY: Past Surgical History  Procedure Laterality Date  . Sentinel lymph node biopsy Right 08/2011  . Breast lumpectomy Right 08/2011  . Portacath placement  2013  . Port-a-cath removal  06/2012  . Carpal tunnel release Left 06/19/2013    Procedure: CARPAL TUNNEL RELEASE;  Surgeon: Cammie Sickle., MD;  Location: [facility];  Service: Orthopedics;  Laterality: Left;    FAMILY HISTORY Family History  Problem Relation Age of Onset  . Colon cancer Mother     dx postpartum  . Congestive Heart Failure Mother   . Alzheimer's disease Mother   . Diabetes Father   . Breast cancer Cousin 40  . Diabetes Paternal Grandmother   . Breast cancer Maternal Aunt   . Breast cancer Maternal Aunt    The patient's father died at the age of 64. The patient's mother died at the age of 5 after a fall, in the setting of Alzheimer's disease. The patient has no siblings. There are some second-degree relatives with breast cancer. She has been tested for the BRCA 1 and 2 mutations, and was found to be negative.  GYNECOLOGIC HISTORY: Menarche age 60. First live birth age 12. She is GX P4. Last menstrual period 2006. Status post hormone replacement, discontinued November 2012.  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 Her husband, Nichole [name], recently retired from the [employer]. This is a second marriage for both of them. Pat's 4 children are [name], 41, who lives in [place] and works as a Cabin crew; [name], who works as a Radio broadcast assistant for an Engineer, production firm in [place]; [name], who works as an Chief Financial Officer in [place]; and the youngest, [name], 28, who works as a Scientist, clinical (histocompatibility and immunogenetics) in [place]. The patient has 4 grandchildren and one on the way. She is a  Nurse, learning disability.  ADVANCED DIRECTIVES: not in place  HEALTH MAINTENANCE:  (Updated November 2014) History  Substance Use Topics  . Smoking status: Former Smoker    Quit date: 05/23/1992  . Smokeless tobacco: Never Used  . Alcohol Use: No     Colonoscopy: Oct 2013/Dr. Hung (Repeat in 2018)  PAP:  Oct 2014, Dr. Toney Rakes  Bone density:  Lipid panel:  Nov 2014, elevated    Allergies  Allergen Reactions  . Pork-Derived Products Rash    Current Outpatient Prescriptions  Medication Sig Dispense Refill  . anastrozole (ARIMIDEX) 1 MG tablet Take 1 tablet (1 mg total) by mouth daily. 90 tablet 12  . atorvastatin (LIPITOR) 20 MG tablet     . azithromycin (ZITHROMAX) 250 MG tablet     . UNABLE TO FIND Take 2 tablets by mouth daily. Med Name: Relizen  (OTC for hot flashes)     No current facility-administered medications for this visit.    OBJECTIVE: Middle-aged white woman in no acute distress Filed Vitals:   10/29/14 1200  BP: 107/71  Pulse: 98  Temp: 98.9 F (37.2 C)  Resp: 18  Body mass index is 36.55 kg/(m^2).    ECOG FS: 0 Filed Weights   10/29/14 1200  Weight: 172 lb (78.019 kg)   Sclerae unicteric, EOMs intact Oropharynx clear, dentition in good repair No cervical or supraclavicular adenopathy Lungs no rales or rhonchi Heart regular rate and rhythm Abd soft, obese, nontender, positive bowel sounds MSK no focal spinal tenderness, no upper extremity lymphedema; there is minimal tenderness to palpation of the rib cage under the right breast, slightly medial. I have to press hard for her to have any discomfort Neuro: nonfocal, well oriented, positive affect Breasts: The right breast is status post lumpectomy and radiation. There is no evidence of local recurrence and there is is no significant change from baseline. The right axilla is benign. The left breast is unremarkable.   LAB RESULTS: Lab Results  Component Value Date   WBC 10.4 05/24/2014   NEUTROABS 5.1  05/24/2014   HGB 14.4 05/24/2014   HCT 42.5 05/24/2014   MCV 88.7 05/24/2014   PLT 277.0 05/24/2014      Chemistry      Component Value Date/Time   NA 140 05/24/2014 1650   NA 142 07/17/2013 1203   K 4.0 05/24/2014 1650   K 4.1 07/17/2013 1203   CL 103 05/24/2014 1650   CL 110* 01/08/2013 0821   CO2 31 05/24/2014 1650   CO2 23 07/17/2013 1203   BUN 13 05/24/2014 1650   BUN 11.4 07/17/2013 1203   CREATININE 0.7 05/24/2014 1650   CREATININE 0.7 07/17/2013 1203      Component Value Date/Time   CALCIUM 9.8 05/24/2014 1650   CALCIUM 9.4 07/17/2013 1203   ALKPHOS 67 05/24/2014 1650   ALKPHOS 69 07/17/2013 1203   AST 33 05/24/2014 1650   AST 21 07/17/2013 1203   ALT 41* 05/24/2014 1650   ALT 20 07/17/2013 1203   BILITOT 0.3 05/24/2014 1650   BILITOT 0.46 07/17/2013 1203     Lipid Panel     Component Value Date/Time   CHOL 266* 05/24/2014 1650   TRIG 258.0* 05/24/2014 1650   HDL 50.10 05/24/2014 1650   CHOLHDL 5 05/24/2014 1650   VLDL 51.6* 05/24/2014 1650   LDLCALC 146* 07/17/2013 1208      STUDIES: She brought me a bone scan from [facility] in [place] obtained 04/29/2012 which shows uptake into adjacent right ribs anterolaterally, felt to be posttraumatic. That study separately scanned  ASSESSMENT: 64 y.o. BRCA negative [place] woman recently moved to [facility], s/p Right lumpectomy December 2012 for a pT1c pN0, stage 1A invasive ductal carcinoma, grade 3, estrogen receptor positive, progesterone receptor negative, with a Ki-67 of 42%  (1) Oncotype DX recurrence score 54% predicting a 10-year risk of distant recurrence of 34% if the only adjuvant systemic treatment is tamoxifen x 5 years  (1) s/p adjuvant radiation completed February 2013  (2) s/p six cycles of carboplatin/ docetaxel/ trastuzumab completed June 2012  (3) started tamoxifen 05/14/2012, discontinued September 2015  (4) to start anastrozole 08/13/2014  (a) bone density obtained in a  [facility] November 20 4:15 shows a T score of -1.8  (5)  Status post endometrial biopsy in October 2014 for a thickened endometrial lining, pathology benign   PLAN:  Pad is tolerating the anastrozole well and the plan will be to continue that for a total of 3 years, at which point she can consider "graduating".  We reviewed her bone density which shows moderate osteopenia. She is already on calcium and  vitamin D supplementation. I strongly urged her to start a walking program with a goal of walking 45 minutes 5 days a week. I also gave her a copy of the [employer].  She brought me a copy of the bone density she had at [facility] in [place] 2 years ago. This showed uptake in 2 adjacent ribs on the right side, most consistent with trauma. I don't think she is having cancer in that area, not only because of the pattern but also because this is very intermittent. She only has discomfort when she exercises for any length of time. Most of the time she has no symptoms whatsoever.  I suggested she try Mucinex to see if that helps with her cough problem. I also suggested they can try coconut oil for the mild dyspareunia. Otherwise she will see me again in 6 months. She knows to call for any problems that may develop before that visit.  Chauncey Cruel, MD     10/29/2014

## 2014-10-29 NOTE — Addendum Note (Signed)
Addended by: Laureen Abrahams on: 10/29/2014 04:24 PM   Modules accepted: Orders, Medications

## 2014-10-29 NOTE — Telephone Encounter (Signed)
, °

## 2014-11-20 ENCOUNTER — Ambulatory Visit: Payer: Federal, State, Local not specified - PPO

## 2014-11-26 ENCOUNTER — Telehealth: Payer: Self-pay

## 2014-11-26 NOTE — Telephone Encounter (Signed)
LVM for the patient to call the practice to confirm date of flu vaccination or to call the office for an apt to take a flu shot. 

## 2014-12-04 ENCOUNTER — Telehealth: Payer: Self-pay

## 2014-12-04 NOTE — Telephone Encounter (Signed)
Per Dr. Jana Hakim, it's OK to receive the yellow fever vaccine. Husband verbalized understanding.

## 2014-12-04 NOTE — Telephone Encounter (Signed)
[place]. She will be traveling to [place]. She just found out she will need to take yellow fever vaccine. She is asking if this if OK. LVM with stacey rn, routed to [place] and Dr Jana Hakim. She can be reached on her cell phone.

## 2015-02-01 ENCOUNTER — Encounter: Payer: Self-pay | Admitting: Gynecology

## 2015-04-21 ENCOUNTER — Encounter: Payer: Self-pay | Admitting: Oncology

## 2015-04-29 ENCOUNTER — Other Ambulatory Visit: Payer: Federal, State, Local not specified - PPO

## 2015-04-30 ENCOUNTER — Other Ambulatory Visit (HOSPITAL_BASED_OUTPATIENT_CLINIC_OR_DEPARTMENT_OTHER): Payer: Federal, State, Local not specified - PPO

## 2015-04-30 DIAGNOSIS — C50911 Malignant neoplasm of unspecified site of right female breast: Secondary | ICD-10-CM | POA: Diagnosis not present

## 2015-04-30 DIAGNOSIS — C50919 Malignant neoplasm of unspecified site of unspecified female breast: Secondary | ICD-10-CM

## 2015-04-30 LAB — CBC WITH DIFFERENTIAL/PLATELET
BASO%: 0.6 % (ref 0.0–2.0)
BASOS ABS: 0 10*3/uL (ref 0.0–0.1)
EOS ABS: 0.2 10*3/uL (ref 0.0–0.5)
EOS%: 3.2 % (ref 0.0–7.0)
HEMATOCRIT: 45.6 % (ref 34.8–46.6)
HGB: 15.1 g/dL (ref 11.6–15.9)
LYMPH%: 40.1 % (ref 14.0–49.7)
MCH: 29.5 pg (ref 25.1–34.0)
MCHC: 33 g/dL (ref 31.5–36.0)
MCV: 89.4 fL (ref 79.5–101.0)
MONO#: 0.3 10*3/uL (ref 0.1–0.9)
MONO%: 4.6 % (ref 0.0–14.0)
NEUT#: 3.3 10*3/uL (ref 1.5–6.5)
NEUT%: 51.5 % (ref 38.4–76.8)
PLATELETS: 254 10*3/uL (ref 145–400)
RBC: 5.1 10*6/uL (ref 3.70–5.45)
RDW: 13.4 % (ref 11.2–14.5)
WBC: 6.4 10*3/uL (ref 3.9–10.3)
lymph#: 2.6 10*3/uL (ref 0.9–3.3)

## 2015-04-30 LAB — COMPREHENSIVE METABOLIC PANEL (CC13)
ALT: 81 U/L — ABNORMAL HIGH (ref 0–55)
ANION GAP: 9 meq/L (ref 3–11)
AST: 57 U/L — ABNORMAL HIGH (ref 5–34)
Albumin: 3.8 g/dL (ref 3.5–5.0)
Alkaline Phosphatase: 99 U/L (ref 40–150)
BILIRUBIN TOTAL: 0.55 mg/dL (ref 0.20–1.20)
BUN: 9.7 mg/dL (ref 7.0–26.0)
CALCIUM: 9.8 mg/dL (ref 8.4–10.4)
CO2: 28 mEq/L (ref 22–29)
Chloride: 107 mEq/L (ref 98–109)
Creatinine: 0.8 mg/dL (ref 0.6–1.1)
EGFR: 75 mL/min/{1.73_m2} — AB (ref >90)
Glucose: 140 mg/dl (ref 70–140)
Potassium: 4.1 mEq/L (ref 3.5–5.1)
Sodium: 144 mEq/L (ref 136–145)
Total Protein: 7.1 g/dL (ref 6.4–8.3)

## 2015-05-06 ENCOUNTER — Ambulatory Visit (HOSPITAL_BASED_OUTPATIENT_CLINIC_OR_DEPARTMENT_OTHER): Payer: Federal, State, Local not specified - PPO | Admitting: Oncology

## 2015-05-06 ENCOUNTER — Telehealth: Payer: Self-pay | Admitting: Oncology

## 2015-05-06 VITALS — BP 125/89 | HR 100 | Temp 97.5°F | Resp 17 | Ht <= 58 in | Wt 169.6 lb

## 2015-05-06 DIAGNOSIS — C50911 Malignant neoplasm of unspecified site of right female breast: Secondary | ICD-10-CM | POA: Diagnosis not present

## 2015-05-06 DIAGNOSIS — E785 Hyperlipidemia, unspecified: Secondary | ICD-10-CM | POA: Diagnosis not present

## 2015-05-06 DIAGNOSIS — C50919 Malignant neoplasm of unspecified site of unspecified female breast: Secondary | ICD-10-CM

## 2015-05-06 MED ORDER — ANASTROZOLE 1 MG PO TABS: 1.0000 mg | ORAL_TABLET | Freq: Every day | ORAL | Status: DC

## 2015-05-06 MED ORDER — VENLAFAXINE HCL ER 37.5 MG PO CP24: 37.5000 mg | ORAL_CAPSULE | Freq: Every day | ORAL | Status: DC

## 2015-05-06 NOTE — Telephone Encounter (Signed)
Appointments made and avs printed for patient °

## 2015-05-06 NOTE — Progress Notes (Signed)
ID: Nichole [name]   DOB: Apr 05, 1951  MR#: [id number]  QXI#:[id number]  PCP: Nichole Grant, MD GYN: Dr Nichole [name] SU:  OTHER MD: Dr. Carol [name]  CHIEF COMPLAINT:  Right Breast Cancer  CURRENT TREATMENT: Anastrozole  HISTORY OF PRESENT ILLNESS: From the original intake note:  The patient herself noted a mass in her right breast and brought it to her physician's attention. She was living in [place] at that time. An ultrasound was done, showing a 1.7 cm spiculated nodule in the right breast with associated calcifications. There were no other findings of concern.  Lumpectomy was performed, with reportedly negative margins (I do not have those records). In Dr. Rosendo [name] note 11/04/2011 this tumor is described as triple positive.  The patient moved to [facility] for further treatment, and sentinel lymph node sampling in December of 2012 was negative. The original tissue obtained in [place] was reviewed, showing a 1.6 cm, grade 3 invasive ductal carcinoma. and the prognostic panel obtained in [place] showed the tumor to be estrogen receptor positive, progesterone receptor negative, and HER-2 negative in the invasive tumor (it was positive in the in situ tumor; SCT 12-30 at [facility]). (A note from that report states that HER-2 by [facility] was performed at [facility] and was reportedly positive in the invasive carcinoma. That report is not available to me today.)  The patient completed adjuvant radiation and was reassessed with an [facility] panel, which showed the tumor to be HER-2 negative, with an MIB-1 of 42%, again estrogen receptor positive and progesterone receptor negative. This panel predicted a 43% risk of recurrence at 8 years. The patient also had an Oncotype DX sent. This was again ER positive, PR negative, and HER-2 negative, and predicted a 34% risk of distant recurrence at 10 years if the patient's only adjuvant systemic treatment was tamoxifen for  5 years. With these numbers Dr. Curlene [name] discussed adjuvant chemotherapy with the patient, and proposed carboplatin, docetaxel, and Herceptin, which was started on February 2013. On a note from 11/12/2011, Dr. Curlene [name] states "even though the HER-2-neu is negative, I am still opting for Herceptin based on the aggressiveness of the tumor." The chemotherapy was completed in June 2013. Herceptin was continued, the plan being to complete one year.   The patient's subsequent history is as detailed below.  INTERVAL HISTORY: Nichole [name] returns today for followup of her right breast cancer accompanied by her husband Nichole [name].  She has been on anastrozole since December 2015. Initially she did fine but now she is having more hot flashes. These are not unbearable.  Vaginal dryness is not a problem although she does feel her skin is a little drier. She never developed the arthralgias and myalgias that some patients can experience on this medication.   She recently returned from a trip to [place] where her local oncologist obtained multiple lab works and studies. The study date included CT scans of the chest abdomen and pelvis 01/27/2015. There was an area of attenuation in the right middle lobe, which is probably related to her prior radiation. There was a hypodense lesion in the liver measuring 0.8 cm. This was considered old indeterminate. She had bilateral renal cysts. She had a possible left ovarian cyst and an ultrasound of the pelvis was obtained which showed a 2 cm cystic lesion in the left ovary, Doppler negative.. Lab work was significant for a cholesterol of 2:15, triglycerides 322, HDL 39 and LDL 124. AST and ALT were both elevated at 38  and 50 on 01/16/2015, but normal when repeated 3 weeks later  REVIEW OF SYSTEMS: Nichole [name]  Is going to the gym regularly. After 3 weeks she has lost a half a pound. She is a little disappointed in this. They're planning a trip to [place] soon which doubtless will  compromise her good  intentions. B otherwise she is having a little plantar fasciitis involving both legs. She is not wearing shoes with any support today but she says she does when she takes walks. A detailed review of systems was otherwise noncontributory   PAST MEDICAL HISTORY: Past Medical History  Diagnosis Date  . Hyperlipidemia     no current med.  Marland Kitchen History of breast cancer 07/2011    right  . History of chemotherapy 11/2011  . 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 Medial epicondylitis 05/2013    left  . Cancer december 2012    right breast    PAST SURGICAL HISTORY: Past Surgical History  Procedure Laterality Date  . Sentinel lymph node biopsy Right 08/2011  . Breast lumpectomy Right 08/2011  . Portacath placement  2013  . Port-a-cath removal  06/2012  . Carpal tunnel release Left 06/19/2013    Procedure: CARPAL TUNNEL RELEASE;  Surgeon: Nichole [name]., MD;  Location: [place];  Service: Orthopedics;  Laterality: Left;    FAMILY HISTORY Family History  Problem Relation Age of Onset  . Colon cancer Mother     dx postpartum  . Congestive Heart Failure Mother   . Alzheimer's disease Mother   . Diabetes Father   . Breast cancer Cousin 31  . Diabetes Paternal Grandmother   . Breast cancer Maternal Aunt   . Breast cancer Maternal Aunt    The patient's father died at the age of 9. The patient's mother died at the age of 57 after a fall, in the setting of Alzheimer's disease. The patient has no siblings. There are some second-degree relatives with breast cancer. She has been tested for the BRCA 1 and 2 mutations, and was found to be negative.  GYNECOLOGIC HISTORY: Menarche age 32. First live birth age 73. She is GX P4. Last menstrual period 2006. Status post hormone replacement, discontinued November 2012.  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 Her husband, Nichole [name], recently retired from the  [employer]. This is a second marriage for both of them. Pat's 4 children are [name], 41, who lives in [place] and works as a Cabin crew; [name], who works as a Radio broadcast assistant for an Engineer, production firm in [place]; [name], who works as an Chief Financial Officer in [place]; and the youngest, [name], 28, who works as a Scientist, clinical (histocompatibility and immunogenetics) in [place]. The patient has 4 grandchildren and one on the way. She is a Nurse, learning disability.  ADVANCED DIRECTIVES: not in place  HEALTH MAINTENANCE:  (Updated November 2014) Social History  Substance Use Topics  . Smoking status: Former Smoker    Quit date: 05/23/1992  . Smokeless tobacco: Never Used  . Alcohol Use: No     Colonoscopy: Oct 2013/Dr. Hung (Repeat in 2018)  PAP:  Oct 2014, Dr. Toney Rakes  Bone density:  Lipid panel:  Nov 2014, elevated    Allergies  Allergen Reactions  . Pork-Derived Products Rash    Current Outpatient Prescriptions  Medication Sig Dispense Refill  . anastrozole (ARIMIDEX) 1 MG tablet Take 1 tablet (1 mg  total) by mouth daily. 90 tablet 12  . atorvastatin (LIPITOR) 20 MG tablet     . Calcium Carb-Cholecalciferol [id number] MG-UNIT CAPS Take 1 tablet by mouth 2 (two) times daily.     No current facility-administered medications for this visit.    OBJECTIVE: Middle-aged white woman who appears stated age 64 Vitals:   05/06/15 1453  BP: 125/89  Pulse: 100  Temp: 97.5 F (36.4 C)  Resp: 17     Body mass index is 36.04 kg/(m^2).    ECOG FS: 0 Filed Weights   05/06/15 1453  Weight: 169 lb 9.6 oz (76.93 kg)   Sclerae unicteric, pupils round and equal Oropharynx clear and moist-- no thrush or other lesions No cervical or supraclavicular adenopathy Lungs no rales or rhonchi Heart regular rate and rhythm Abd soft, obese, nontender, positive bowel sounds MSK no focal spinal tenderness, no upper extremity lymphedema Neuro: nonfocal, well  oriented, positive affect Breasts: The right breast is status post lumpectomy and radiation. There is no evidence of local recurrence. The right axilla is benign. The left breast is unremarkable   LAB RESULTS: Lab Results  Component Value Date   WBC 6.4 04/30/2015   NEUTROABS 3.3 04/30/2015   HGB 15.1 04/30/2015   HCT 45.6 04/30/2015   MCV 89.4 04/30/2015   PLT 254 04/30/2015      Chemistry      Component Value Date/Time   NA 144 04/30/2015 1135   NA 140 05/24/2014 1650   K 4.1 04/30/2015 1135   K 4.0 05/24/2014 1650   CL 103 05/24/2014 1650   CL 110* 01/08/2013 0821   CO2 28 04/30/2015 1135   CO2 31 05/24/2014 1650   BUN 9.7 04/30/2015 1135   BUN 13 05/24/2014 1650   CREATININE 0.8 04/30/2015 1135   CREATININE 0.7 05/24/2014 1650      Component Value Date/Time   CALCIUM 9.8 04/30/2015 1135   CALCIUM 9.8 05/24/2014 1650   ALKPHOS 99 04/30/2015 1135   ALKPHOS 67 05/24/2014 1650   AST 57* 04/30/2015 1135   AST 33 05/24/2014 1650   ALT 81* 04/30/2015 1135   ALT 41* 05/24/2014 1650   BILITOT 0.55 04/30/2015 1135   BILITOT 0.3 05/24/2014 1650     Lipid Panel     Component Value Date/Time   CHOL 266* 05/24/2014 1650   TRIG 258.0* 05/24/2014 1650   HDL 50.10 05/24/2014 1650   CHOLHDL 5 05/24/2014 1650   VLDL 51.6* 05/24/2014 1650   LDLCALC 146* 07/17/2013 1208      STUDIES: CT scan of the chest abdomen and pelvis May 2016 discussed above. She had a bone density 08/06/2014 a [facility], showing osteopenia  ASSESSMENT: 64 y.o. BRCA negative [place] woman recently moved to [facility], s/p Right lumpectomy December 2012 for a pT1c pN0, stage 1A invasive ductal carcinoma, grade 3, estrogen receptor positive, progesterone receptor negative, with a Ki-67 of 42%  (1) Oncotype DX recurrence score 54% predicting a 10-year risk of distant recurrence of 34% if the only adjuvant systemic treatment is tamoxifen x 5 years  (1) s/p adjuvant radiation  completed February 2013  (2) s/p six cycles of carboplatin/ docetaxel/ trastuzumab completed June 2012  (3) started tamoxifen 05/14/2012, discontinued September 2015  (4) to start anastrozole 08/13/2014  (a) bone density obtained in a [facility]  08/06/2014 shows a T score of -1.8  (5)  Status post endometrial biopsy in October 2014 for a thickened endometrial lining, pathology benign   PLAN:  Nichole [name] is doing well from a breast cancer point of view, with no evidence of disease recurrence now nearly 4 years out from her definitive surgery. She started anti-estrogens September 2013 and the plan is to go to a total of 5 years, to September 2018.  We discussed how many more studies she will need. I don't feel the CT of the chest abdomen and pelvis obtained in May was necessary and when we obtain the necessary studies generally what we do is fine unimportant thinks which then need to be followed up. At some point we may want to do an ultrasound of the liver. Note that there is no trend at all in her transaminases elevations, which have been normal as frequently is mildly elevated  She is going to be seeing Dr. Toney Rakes soon. He may want to repeat an ultrasound of the left ovary to see if the cysts seen and may have resolved or if they warrant any further evaluation.  Fats cholesterol and LDL are up. We discussed diet. Unfortunately she is about to go on a long trip to [place] no less so I don't think she is going to get those numbers down anytime soon. We're going to repeat those in 3 months together with routine lab work and she will see me again in 6 months from now for routine follow-up  She knows to call for any problems that may develop before her next visit here.    Chauncey Cruel, MD     05/06/2015

## 2015-05-13 ENCOUNTER — Other Ambulatory Visit: Payer: Self-pay | Admitting: Oncology

## 2015-05-13 DIAGNOSIS — Z9889 Other specified postprocedural states: Secondary | ICD-10-CM

## 2015-05-13 DIAGNOSIS — Z853 Personal history of malignant neoplasm of breast: Secondary | ICD-10-CM

## 2015-07-04 ENCOUNTER — Ambulatory Visit
Admission: RE | Admit: 2015-07-04 | Discharge: 2015-07-04 | Disposition: A | Discharge status: Home / Self Care | Payer: Federal, State, Local not specified - PPO | Source: Ambulatory Visit | Attending: Oncology | Admitting: Oncology

## 2015-07-04 DIAGNOSIS — Z853 Personal history of malignant neoplasm of breast: Secondary | ICD-10-CM

## 2015-07-04 DIAGNOSIS — Z9889 Other specified postprocedural states: Secondary | ICD-10-CM

## 2015-07-15 ENCOUNTER — Other Ambulatory Visit (HOSPITAL_BASED_OUTPATIENT_CLINIC_OR_DEPARTMENT_OTHER): Payer: Federal, State, Local not specified - PPO

## 2015-07-15 DIAGNOSIS — C50911 Malignant neoplasm of unspecified site of right female breast: Secondary | ICD-10-CM

## 2015-07-15 DIAGNOSIS — E785 Hyperlipidemia, unspecified: Secondary | ICD-10-CM

## 2015-07-15 DIAGNOSIS — C50919 Malignant neoplasm of unspecified site of unspecified female breast: Secondary | ICD-10-CM

## 2015-07-15 LAB — CBC WITH DIFFERENTIAL/PLATELET
BASO%: 0.6 % (ref 0.0–2.0)
BASOS ABS: 0 10*3/uL (ref 0.0–0.1)
EOS ABS: 0.2 10*3/uL (ref 0.0–0.5)
EOS%: 2.5 % (ref 0.0–7.0)
HEMATOCRIT: 45.5 % (ref 34.8–46.6)
HEMOGLOBIN: 15 g/dL (ref 11.6–15.9)
LYMPH#: 2.5 10*3/uL (ref 0.9–3.3)
LYMPH%: 38.7 % (ref 14.0–49.7)
MCH: 29.4 pg (ref 25.1–34.0)
MCHC: 33 g/dL (ref 31.5–36.0)
MCV: 88.9 fL (ref 79.5–101.0)
MONO#: 0.3 10*3/uL (ref 0.1–0.9)
MONO%: 4.7 % (ref 0.0–14.0)
NEUT#: 3.5 10*3/uL (ref 1.5–6.5)
NEUT%: 53.5 % (ref 38.4–76.8)
PLATELETS: 250 10*3/uL (ref 145–400)
RBC: 5.12 10*6/uL (ref 3.70–5.45)
RDW: 13 % (ref 11.2–14.5)
WBC: 6.5 10*3/uL (ref 3.9–10.3)

## 2015-07-15 LAB — COMPREHENSIVE METABOLIC PANEL (CC13)
ALT: 68 U/L — AB (ref 0–55)
AST: 53 U/L — AB (ref 5–34)
Albumin: 3.7 g/dL (ref 3.5–5.0)
Alkaline Phosphatase: 105 U/L (ref 40–150)
Anion Gap: 9 mEq/L (ref 3–11)
BUN: 12.5 mg/dL (ref 7.0–26.0)
CALCIUM: 9.9 mg/dL (ref 8.4–10.4)
CHLORIDE: 107 meq/L (ref 98–109)
CO2: 26 meq/L (ref 22–29)
CREATININE: 0.8 mg/dL (ref 0.6–1.1)
EGFR: 83 mL/min/{1.73_m2} — ABNORMAL LOW (ref >90)
GLUCOSE: 146 mg/dL — AB (ref 70–140)
Potassium: 3.8 mEq/L (ref 3.5–5.1)
Sodium: 143 mEq/L (ref 136–145)
Total Bilirubin: 0.68 mg/dL (ref 0.20–1.20)
Total Protein: 7.2 g/dL (ref 6.4–8.3)

## 2015-07-15 LAB — LIPID PANEL
CHOLESTEROL: 304 mg/dL — AB (ref 125–200)
HDL: 47 mg/dL (ref >=46)
LDL CALC: 210 mg/dL — AB (ref <130)
Total CHOL/HDL Ratio: 6.5 Ratio — ABNORMAL HIGH (ref <=5.0)
Triglycerides: 236 mg/dL — ABNORMAL HIGH (ref <150)
VLDL: 47 mg/dL — AB (ref <30)

## 2015-07-16 ENCOUNTER — Ambulatory Visit (INDEPENDENT_AMBULATORY_CARE_PROVIDER_SITE_OTHER): Payer: Federal, State, Local not specified - PPO | Admitting: Gynecology

## 2015-07-16 ENCOUNTER — Encounter: Payer: Self-pay | Admitting: Gynecology

## 2015-07-16 VITALS — BP 116/76 | Ht <= 58 in | Wt 167.0 lb

## 2015-07-16 DIAGNOSIS — Z01419 Encounter for gynecological examination (general) (routine) without abnormal findings: Secondary | ICD-10-CM | POA: Diagnosis not present

## 2015-07-16 DIAGNOSIS — N83202 Unspecified ovarian cyst, left side: Secondary | ICD-10-CM | POA: Diagnosis not present

## 2015-07-16 DIAGNOSIS — R9389 Abnormal findings on diagnostic imaging of other specified body structures: Secondary | ICD-10-CM

## 2015-07-16 DIAGNOSIS — R938 Abnormal findings on diagnostic imaging of other specified body structures: Secondary | ICD-10-CM | POA: Diagnosis not present

## 2015-07-16 DIAGNOSIS — M8580 Other specified disorders of bone density and structure, unspecified site: Secondary | ICD-10-CM

## 2015-07-16 DIAGNOSIS — Z853 Personal history of malignant neoplasm of breast: Secondary | ICD-10-CM | POA: Diagnosis not present

## 2015-07-16 NOTE — Progress Notes (Signed)
Nichole [name] [birth year] [id number]   History:    64 y.o.  for annual gyn exam who has past history of breast cancer. She spends half of the year in [place] and brought with her information from an ultrasound that she had done which had demonstrated she had ovarian cysts on her left ovary average size 2-1/2 cm and a thickened endometrium of 4.5 mm. Patient reports no vaginal bleeding. In November of last year to discuss her pelvic ultrasound. 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 Patient is status post adjuvant radiation treatment which was completed in February of 2013. She also completed in June 2012 six cycles of carboplatin/ docetaxel/ trastuzumab. She was on Herceptin for a short while after questions were raised in reference to her HER-2 status which was believed to be negative. She was taken off the tamoxifen  and has been on anastrozole 1 mg daily.Patient was also BRCA negative.  Her ultrasound on November 2015 urine office demonstrated following: Uterus measured 8.1 x 5.1 x 3.9 cm with endometrial stripe of 9.2 mm. Her thickened endometrium was avascular. Homogeneous echo pattern. Right ovary located above the uterus was normal left ovary was normal. No fluid in the cul-de-sac.  She had an endometrial biopsy in 2014 and 2015 as a result of her thickened endometrium while she had been on tamoxifen as well as on anastrozole and both pathology reports were negative for hyperplasia and malignancy.  Patient's mother had history of colon cancer. Patient herself has had colonoscopy demonstrating polyps. The polyps were benign back in 2013 she is due for follow-up colonoscopy in 2018.Patient stated many years ago she had cryotherapy as well as either laser or LEEP conization but subsequent Pap smears have been  negative. She also stated that in [place] far she had a normal bone density study in January of 2013 which was reportedly normal. Patient declined flu vaccine.  Patient's bone density study in 2015 demonstrated the lowest T score was -1.8 at the AP spine  Past medical history,surgical history, family history and social history were all reviewed and documented in the EPIC chart.  Gynecologic History No LMP recorded. Patient is postmenopausal. Contraception: post menopausal status Last Pap: 2014. Results were: normal Last mammogram: 2016. Results were: normal  Obstetric History OB History  Gravida Para Term Preterm AB SAB TAB Ectopic Multiple Living  $Remov'4 4 4       4    'wsEDCi$ # Outcome Date GA Lbr Len/2nd Weight Sex Delivery Anes PTL Lv  4 Term     M CS-Unspec  N Y  3 Term     M Vag-Spont  N Y  2 Term     M Vag-Spont  N Y  1 Term     M Vag-Spont  N Y       ROS: A ROS was performed and pertinent positives and negatives are included in the history.  GENERAL: No fevers or chills. HEENT: No change in vision, no earache, sore throat or sinus congestion. NECK: No pain or stiffness. CARDIOVASCULAR: No chest pain or pressure. No palpitations. PULMONARY: No shortness of breath, cough or wheeze. GASTROINTESTINAL: No abdominal pain, nausea, vomiting or diarrhea, melena or bright red blood per rectum. GENITOURINARY: No urinary frequency, urgency, hesitancy or dysuria. MUSCULOSKELETAL: No joint or muscle pain, no back pain, no recent trauma.  DERMATOLOGIC: No rash, no itching, no lesions. ENDOCRINE: No polyuria, polydipsia, no heat or cold intolerance. No recent change in weight. HEMATOLOGICAL: No anemia or easy bruising or bleeding. NEUROLOGIC: No headache, seizures, numbness, tingling or weakness. PSYCHIATRIC: No depression, no loss of interest in normal activity or change in sleep pattern.     Exam: chaperone present  BP 116/76 mmHg  Ht $R'4\' 10"'Hd$  (1.473 m)  Wt 167 lb (75.751 kg)  BMI 34.91 kg/m2  Body  mass index is 34.91 kg/(m^2).  General appearance : Well developed well nourished female. No acute distress HEENT: Eyes: no retinal hemorrhage or exudates,  Neck supple, trachea midline, no carotid bruits, no thyroidmegaly Lungs: Clear to auscultation, no rhonchi or wheezes, or rib retractions  Heart: Regular rate and rhythm, no murmurs or gallops Breast:Examined in sitting and supine position were symmetrical in appearance, no palpable masses or tenderness,  no skin retraction, no nipple inversion, no nipple discharge, no skin discoloration, no axillary or supraclavicular lymphadenopathy Abdomen: no palpable masses or tenderness, no rebound or guarding Extremities: no edema or skin discoloration or tenderness  Pelvic:  Bartholin, Urethra, Skene Glands: Within normal limits             Vagina: No gross lesions or discharge  Cervix: No gross lesions or discharge  Uterus  anteverted, normal size, shape and consistency, non-tender and mobile  Adnexa  Without masses or tenderness  Anus and perineum  normal   Rectovaginal  normal sphincter tone without palpated masses or tenderness             Hemoccult PCP provides     Assessment/Plan:  64 y.o. female for annual exam who will return back to the office for an ultrasound to compare with previous study done in April 2016 in her native country of [place] as well as for endometrial thickness. Pap smear not indicated. Patient refused the flu vaccine.   Terrance Mass MD, 3:08 PM 07/16/2015

## 2015-07-22 ENCOUNTER — Telehealth: Payer: Self-pay | Admitting: *Deleted

## 2015-07-22 NOTE — Telephone Encounter (Signed)
Pt called stating that she had labs done on 07/15/15.  Pt would like to know Dr. Virgie Dad recommendations and/or instructions about her increased cholesterol and live enzymes levels.  Pt stated Dr. Jana Hakim monitors all her medical issues as well as her oncology problem.    Pt's    Phone     [phone].

## 2015-07-24 ENCOUNTER — Other Ambulatory Visit: Payer: Self-pay | Admitting: Oncology

## 2015-07-24 ENCOUNTER — Telehealth: Payer: Self-pay

## 2015-07-24 ENCOUNTER — Telehealth: Payer: Self-pay | Admitting: Oncology

## 2015-07-24 DIAGNOSIS — K760 Fatty (change of) liver, not elsewhere classified: Secondary | ICD-10-CM

## 2015-07-24 DIAGNOSIS — E7800 Pure hypercholesterolemia, unspecified: Secondary | ICD-10-CM

## 2015-07-24 MED ORDER — ROSUVASTATIN CALCIUM 5 MG PO TABS: 5.0000 mg | ORAL_TABLET | Freq: Every day | ORAL | Status: DC

## 2015-07-24 NOTE — Telephone Encounter (Signed)
Pt called stating she cannot make an appt 12/14 for lab and 12/21 for MD. She is going to [place] from Dec 8 to about Feb 21. She does have an MD in [place] she can see if you want her to. Or change the appts at [facility].  I have called in the crestor.

## 2015-07-24 NOTE — Telephone Encounter (Signed)
Called and left a message with all appointments °

## 2015-07-24 NOTE — Telephone Encounter (Signed)
Please let patient know her liver function tests are elevated due to fatty liver. This was shown by US of the abdomen 2012. The treatment for that is diet and exercise.  As far as cholesterol is concerned, please start pt on crestor 5 mg daily, and to see [place] DEC 21 (I have placed POF) with repeat labs before visit  Call us if any concerns  Thanks!

## 2015-07-25 ENCOUNTER — Telehealth: Payer: Self-pay | Admitting: Nurse Practitioner

## 2015-07-25 ENCOUNTER — Other Ambulatory Visit: Payer: Self-pay

## 2015-07-25 NOTE — Progress Notes (Signed)
Writer called patient back regarding her recent lab work .  Her cholesterol as well as her liver function tests were elevated.  Dr. Vinie Sill sent crestor 5 mg to patient's pharmacy.  Patient is very insistent that she have a repeat lipid panel next week fasting because she feels that this will alter the results.  POF placed for repeat labs.

## 2015-07-25 NOTE — Telephone Encounter (Signed)
Called and left a message with 11/15 lab appointment

## 2015-07-28 ENCOUNTER — Other Ambulatory Visit: Payer: Self-pay | Admitting: Oncology

## 2015-07-28 ENCOUNTER — Telehealth: Payer: Self-pay | Admitting: Nurse Practitioner

## 2015-07-28 ENCOUNTER — Telehealth: Payer: Self-pay | Admitting: *Deleted

## 2015-07-28 NOTE — Telephone Encounter (Signed)
"  I'm returning call to [facility] with Dr Jana Hakim in reference to appointment for tomorrow I'm not able to attend.  Please call."

## 2015-07-28 NOTE — Telephone Encounter (Signed)
Called patient and she is aware of her cancelled 08/2015 appointments

## 2015-07-29 ENCOUNTER — Other Ambulatory Visit: Payer: Federal, State, Local not specified - PPO

## 2015-07-30 ENCOUNTER — Other Ambulatory Visit: Payer: Self-pay

## 2015-07-30 ENCOUNTER — Other Ambulatory Visit: Payer: Self-pay | Admitting: Internal Medicine

## 2015-07-30 DIAGNOSIS — C50911 Malignant neoplasm of unspecified site of right female breast: Secondary | ICD-10-CM

## 2015-07-30 DIAGNOSIS — E785 Hyperlipidemia, unspecified: Secondary | ICD-10-CM

## 2015-07-31 ENCOUNTER — Encounter: Payer: Federal, State, Local not specified - PPO | Admitting: Nutrition

## 2015-08-06 ENCOUNTER — Ambulatory Visit (INDEPENDENT_AMBULATORY_CARE_PROVIDER_SITE_OTHER): Payer: Federal, State, Local not specified - PPO

## 2015-08-06 ENCOUNTER — Encounter: Payer: Self-pay | Admitting: Gynecology

## 2015-08-06 ENCOUNTER — Ambulatory Visit (INDEPENDENT_AMBULATORY_CARE_PROVIDER_SITE_OTHER): Payer: Federal, State, Local not specified - PPO | Admitting: Gynecology

## 2015-08-06 VITALS — BP 128/86

## 2015-08-06 DIAGNOSIS — R9389 Abnormal findings on diagnostic imaging of other specified body structures: Secondary | ICD-10-CM

## 2015-08-06 DIAGNOSIS — N83202 Unspecified ovarian cyst, left side: Secondary | ICD-10-CM

## 2015-08-06 DIAGNOSIS — Z853 Personal history of malignant neoplasm of breast: Secondary | ICD-10-CM

## 2015-08-06 DIAGNOSIS — R938 Abnormal findings on diagnostic imaging of other specified body structures: Secondary | ICD-10-CM | POA: Diagnosis not present

## 2015-08-06 NOTE — Patient Instructions (Signed)
Laparoscopia de diagnstico (Diagnostic Laparoscopy) La laparoscopia de diagnstico es un procedimiento que se hace para diagnosticar enfermedades en el abdomen. Durante su realizacin, se introduce en el abdomen un instrumento delgado del tamao de un lpiz que tiene [place] luz, llamado laparoscopio, a travs de una incisin. El laparoscopio le permite al mdico observar los rganos internos. INFORME A SU MDICO:  Cualquier alergia que tenga.  Todos los [employer], incluidos vitaminas, hierbas, gotas oftlmicas, cremas y medicamentos de venta libre.  Problemas previos que usted o los [employer] de su familia hayan tenido con el uso de anestsicos.  Enfermedades de la sangre que tenga.  Si tiene cirugas previas.  Enfermedades que tenga. RIESGOS Y COMPLICACIONES  En general, se trata de un procedimiento seguro. Sin embargo, es posible que haya problemas, que pueden incluir lo siguiente:  Infeccin.  Hemorragia.  Lesiones en los rganos circundantes.  Reaccin alrgica a la anestesia usada durante el procedimiento. ANTES DEL PROCEDIMIENTO  No coma ni beba nada despus de la medianoche anterior al procedimiento o segn lo que le haya indicado el mdico.  Consulte a su mdico acerca de estos temas:  Cambiar o suspender los medicamentos que toma habitualmente.  Tomar medicamentos, como aspirina e ibuprofeno. Estos medicamentos pueden tener un efecto anticoagulante en la [place]. No tome estos medicamentos antes del procedimiento si el mdico le indica que no lo haga.  Haga planes para que una persona lo lleve de vuelta a su casa despus del procedimiento. PROCEDIMIENTO  Le administrarn un medicamento para ayudarlo a relajarse (sedante).  Le administrarn un medicamento que lo har dormir (anestesia general).  Se inflar el abdomen con un gas, lo que facilitar la observacin.  Le practicarn incisiones pequeas en el abdomen.  A travs de las incisiones, se  introducirn un laparoscopio y otros instrumentos pequeos en el abdomen.  Se puede tomar Truddie Coco de tejido de un rgano del abdomen para su anlisis.  Se retirarn los instrumentos del abdomen.  Se extraer el gas.  Las incisiones se cerrarn con puntos (suturas). DESPUS DEL PROCEDIMIENTO  Le controlarn con frecuencia la presin arterial, la frecuencia cardaca, la frecuencia respiratoria y Retail buyer de oxgeno en la sangre hasta que haya desaparecido el efecto de los medicamentos administrados.   Esta informacin no tiene Marine scientist el consejo del mdico. Asegrese de hacerle al mdico cualquier pregunta que tenga.   Document Released: 08/30/2005 Document Revised: 09/20/2014 Elsevier Interactive Patient Education 2016 [place] ovrico (Ovarian Cyst) Un quiste ovrico es una bolsa llena de lquido que se forma en el ovario. Los ovarios son los rganos pequeos que producen vulos en las mujeres. Se pueden formar varios tipos de [employer]. Benito Mccreedy no son cancerosos. Muchos de ellos no causan problemas y con frecuencia desaparecen solos. Algunos pueden provocar sntomas y requerir Clinical research associate. Los tipos ms comunes de quistes ovricos son los siguientes:  Quistes funcionales: estos quistes pueden aparecer todos los meses durante el ciclo menstrual. Esto es normal. Estos quistes suelen desaparecer con el prximo ciclo menstrual si la mujer no queda embarazada. En general, los quistes funcionales no tienen sntomas.  Endometriomas: estos quistes se forman a partir del tejido que recubre el tero. Tambin se denominan "quistes de chocolate" porque se llenan de sangre que se vuelve marrn. Este tipo de quiste puede provocar dolor en la zona inferior del abdomen durante la relacin sexual y con el perodo menstrual.  Cistoadenomas: este tipo se desarrolla a partir de las clulas  que se [employer] exterior del ovario. Estos quistes pueden ser muy grandes  y causar dolor en la zona inferior del abdomen y durante la relacin sexual. Cindra Presume tipo de quiste puede girar sobre s mismo, cortar el suministro de Biochemist, clinical y causar un dolor intenso. Tambin se puede romper con facilidad y Stage manager.  Quistes dermoides: este tipo de quiste a veces se encuentra en ambos ovarios. Estos quistes pueden [employer] tipos de tejidos del organismo, como piel, dientes, pelo o Database administrator. Generalmente no tienen sntomas, a menos que sean muy grandes.  Quistes tecalutenicos: aparecen cuando se produce demasiada cantidad de cierta hormona (gonadotropina corinica humana) que estimula en exceso al ovario para que produzca vulos. Esto es ms frecuente despus de procedimientos que ayudan a la concepcin de un beb (fertilizacin in vitro). CAUSAS   Los medicamentos para la fertilidad pueden provocar una afeccin mediante la cual se forman mltiples quistes de gran tamao en los ovarios. Esta se denomina sndrome de hiperestimulacin ovrica.  El sndrome del ovario poliqustico es una afeccin que puede causar desequilibrios hormonales, los cuales pueden dar como resultado quistes ovricos no funcionales. SIGNOS Y SNTOMAS  Muchos quistes ovricos no causan sntomas. Si se presentan sntomas, stos pueden ser:  Dolor o molestias en la pelvis.  Dolor en la parte baja del abdomen.  [place].  Aumento del permetro abdominal (hinchazn).  Perodos menstruales anormales.  Aumento del [employer] perodos [place].  Cese de los perodos menstruales sin estar embarazada. DIAGNSTICO  Estos quistes se descubren comnmente durante un examen de rutina o una exploracin ginecolgica anual. Es posible que se ordenen otros estudios para obtener ms informacin sobre el Oriental. Estos estudios pueden ser:  Engineer, materials.  Radiografas de la pelvis.  Tomografa computada.  Resonancia magntica.  Anlisis de  Justice. TRATAMIENTO  Muchos de los quistes ovricos desaparecen por s solos, sin tratamiento. Es probable que el mdico quiera controlar el quiste regularmente durante 2 o 8mses para ver si se produce algn cambio. En el caso de las mujeres en la menopausia, es particularmente importante controlar de cerca al quiste ya que el ndice de cncer de ovario en las mujeres menopusicas es ms alto. Cuando se requiere tClinical research associate este puede incluir cualquiera de los siguientes:  Un procedimiento para drenar el quiste (aspiracin). Esto se puede realizar mFamily Dollar Storesuso de u[place] y [place] Tambin se puede hacer a travs de un procedimiento laparoscpico, En este procedimiento, se inserta un tubo delgado que emite luz y que tiene una pequea cmara en un extremo (laparoscopio) a travs de una pequea incisin.  Ciruga para extirpar el quiste completo. Esto se puede realizar mediante una ciruga laparoscpica o uArdelia Memsciruga abierta, la cual implica realizar una incisin ms grande en la parte inferior del abdomen.  Tratamiento hormonal o pldoras anticonceptivas. Estos mtodos a veces se usan para ayudar a dWriter IBurdettsolo medicamentos de venta libre o recetados, segn las indicaciones del mdico.  CConsulting civil engineera las consultas de control con su mdico segn las indicaciones.  Hgase exmenes plvicos regulares y pruebas de [place]apan[place]olaou. SOLICITE ATENCIN MDICA SI:   Los perodos se atrasan, son irregulares, dolorosos o cesan.  El dolor plvico o abdominal no desaparece.  El abdomen se agranda o se hincha.  Siente presin en la vejiga o no puede vaciarla completamente.  Siente dolor durante las rOffice Depot  Tiene una sensacin de  hinchazn, presin o molestias en el estmago.  Pierde peso sin razn aparente.  Siente un Pharmacist, hospital.  Est estreida.  Pierde el apetito.  Le aparece acn.  Nota un  aumento del vello corporal y facial.  Elenore Rota de peso sin hacer modificaciones en su actividad fsica y en su dieta habitual.  Sospecha que est embarazada. SOLICITE ATENCIN MDICA DE INMEDIATO SI:   Siente cada vez ms dolor abdominal.  Tiene malestar estomacal (nuseas) y vomita.  Tiene fiebre que se presenta de [place] repentina.  Siente dolor abdominal al defecar.  Sus perodos menstruales son ms abundantes que lo habitual. ASEGRESE DE QUE:   Comprende estas instrucciones.  Controlar su afeccin.  Recibir ayuda de inmediato si no mejora o si empeora.   Esta informacin no tiene Marine scientist el consejo del mdico. Asegrese de hacerle al mdico cualquier pregunta que tenga.   Document Released: 06/09/2005 Document Revised: 09/04/2013 Elsevier Interactive Patient Education [employer].

## 2015-08-06 NOTE — Progress Notes (Signed)
   Patient is a 64 year old who presented to the office for follow-up ultrasound. Patient with past history of breast cancer.She spends half of the year in [place] and brought with her information from an ultrasound that she had done which had demonstrated she had ovarian cysts on her left ovary average size 2-1/2 cm and a thickened endometrium of 4.5 mm. Patient reports no vaginal bleeding. In November of last year to discuss her pelvic ultrasound. 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 Patient is status post adjuvant radiation treatment which was completed in February of 2013. She also completed in June 2012 six cycles of carboplatin/ docetaxel/ trastuzumab. She was on Herceptin for a short while after questions were raised in reference to her HER-2 status which was believed to be negative. She was taken off the tamoxifen and has been on anastrozole 1 mg daily.Patient was also BRCA negative.  Her ultrasound on November 2015 in the office demonstrated following: Uterus measured 8.1 x 5.1 x 3.9 cm with endometrial stripe of 9.2 mm. Her thickened endometrium was avascular. Homogeneous echo pattern. Right ovary located above the uterus was normal left ovary was normal. No fluid in the cul-de-sac.  She had an endometrial biopsy in 2014 and 2015 as a result of her thickened endometrium while she had been on tamoxifen as well as on anastrozole and both pathology reports were negative for hyperplasia and malignancy.  Patient denies any vaginal bleeding. Today's ultrasound demonstrated the following: Uterus measured 5.4 x 4.5 x 3.2 cm with endometrial stripe of 5.5 mm. A left ovarian echo-free thick wall a vascular mass with calcification within it measuring 27 x 20 x 20 mm. Right ovary not identified but right adnexa was normal.  There was no fluid in the cul-de-sac noted.  Assessment/plan: Patient with history of breast cancer with ultrasound demonstrating endometrial thickness has decreased from 9.2 mm in November 2015 to 5.5 today. But a left ovarian echo-free thick wall a vascular mass with calcification within it measuring 27 x 20 x 20 mm was noted. This was not present before. A  ROMA-1 ovarian cancer screening blood test was done today. She will return back in 2 months for follow-up ultrasound. If this area still present we will proceed with laparoscopic bilateral salpingo-oophorectomy or intervening sooner if the above mentioned blood test becomes abnormal. Patient refused an endometrial biopsy today. She is not bleeding vaginally.

## 2015-08-14 ENCOUNTER — Telehealth: Payer: Self-pay | Admitting: *Deleted

## 2015-08-14 NOTE — Telephone Encounter (Signed)
Pt left message requesting labs results on 08/06/15 office visit for ROMA test. I called pt back and left on her voicemail that results are not back yet and can take up to 15 days per lab.

## 2015-08-15 LAB — OVARIAN MALIGNANCY RISK-ROMA
CA125: 14 U/mL (ref <35)
HE4: 42 pmol (ref <151)
ROMA POSTMENOPAUSAL: 0.94 (ref <2.77)
ROMA PREMENOPAUSAL: 0.5 (ref <1.31)

## 2015-08-27 ENCOUNTER — Other Ambulatory Visit (HOSPITAL_BASED_OUTPATIENT_CLINIC_OR_DEPARTMENT_OTHER): Payer: Federal, State, Local not specified - PPO

## 2015-08-27 DIAGNOSIS — K760 Fatty (change of) liver, not elsewhere classified: Secondary | ICD-10-CM

## 2015-08-27 DIAGNOSIS — C50919 Malignant neoplasm of unspecified site of unspecified female breast: Secondary | ICD-10-CM

## 2015-08-27 DIAGNOSIS — C50911 Malignant neoplasm of unspecified site of right female breast: Secondary | ICD-10-CM

## 2015-08-27 LAB — COMPREHENSIVE METABOLIC PANEL
ALBUMIN: 3.7 g/dL (ref 3.5–5.0)
ALK PHOS: 94 U/L (ref 40–150)
ALT: 45 U/L (ref 0–55)
ANION GAP: 8 meq/L (ref 3–11)
AST: 32 U/L (ref 5–34)
BILIRUBIN TOTAL: 0.69 mg/dL (ref 0.20–1.20)
BUN: 9.3 mg/dL (ref 7.0–26.0)
CALCIUM: 9.6 mg/dL (ref 8.4–10.4)
CO2: 24 mEq/L (ref 22–29)
Chloride: 109 mEq/L (ref 98–109)
Creatinine: 0.8 mg/dL (ref 0.6–1.1)
EGFR: 83 mL/min/{1.73_m2} — AB (ref >90)
GLUCOSE: 87 mg/dL (ref 70–140)
POTASSIUM: 3.9 meq/L (ref 3.5–5.1)
SODIUM: 142 meq/L (ref 136–145)
TOTAL PROTEIN: 7 g/dL (ref 6.4–8.3)

## 2015-08-27 LAB — CBC WITH DIFFERENTIAL/PLATELET
BASO%: 0.1 % (ref 0.0–2.0)
BASOS ABS: 0 10*3/uL (ref 0.0–0.1)
EOS ABS: 0.2 10*3/uL (ref 0.0–0.5)
EOS%: 2.4 % (ref 0.0–7.0)
HEMATOCRIT: 44.1 % (ref 34.8–46.6)
HEMOGLOBIN: 14.7 g/dL (ref 11.6–15.9)
LYMPH#: 2.9 10*3/uL (ref 0.9–3.3)
LYMPH%: 40.9 % (ref 14.0–49.7)
MCH: 29.6 pg (ref 25.1–34.0)
MCHC: 33.3 g/dL (ref 31.5–36.0)
MCV: 88.7 fL (ref 79.5–101.0)
MONO#: 0.4 10*3/uL (ref 0.1–0.9)
MONO%: 5.2 % (ref 0.0–14.0)
NEUT%: 51.4 % (ref 38.4–76.8)
NEUTROS ABS: 3.6 10*3/uL (ref 1.5–6.5)
PLATELETS: 228 10*3/uL (ref 145–400)
RBC: 4.97 10*6/uL (ref 3.70–5.45)
RDW: 13.4 % (ref 11.2–14.5)
WBC: 7 10*3/uL (ref 3.9–10.3)

## 2015-08-27 LAB — LIPID PANEL
CHOL/HDL RATIO: 2.9 ratio (ref <=5.0)
Cholesterol: 162 mg/dL (ref 125–200)
HDL: 56 mg/dL (ref >=46)
LDL Cholesterol: 82 mg/dL (ref <130)
TRIGLYCERIDES: 122 mg/dL (ref <150)
VLDL: 24 mg/dL (ref <30)

## 2015-09-03 ENCOUNTER — Telehealth: Payer: Self-pay | Admitting: *Deleted

## 2015-09-03 ENCOUNTER — Ambulatory Visit: Payer: Federal, State, Local not specified - PPO | Admitting: Nurse Practitioner

## 2015-09-03 NOTE — Telephone Encounter (Signed)
Received call from pt who is out of country & will be traveling back in February & wants to cancel todays appt but will see Dr. Jana Hakim in February.  Notified Gentry Fitz NP & appt cancelled.

## 2015-10-31 ENCOUNTER — Other Ambulatory Visit: Payer: Self-pay

## 2015-10-31 DIAGNOSIS — C50911 Malignant neoplasm of unspecified site of right female breast: Secondary | ICD-10-CM

## 2015-11-03 ENCOUNTER — Other Ambulatory Visit (HOSPITAL_BASED_OUTPATIENT_CLINIC_OR_DEPARTMENT_OTHER): Payer: Federal, State, Local not specified - PPO

## 2015-11-03 DIAGNOSIS — C50911 Malignant neoplasm of unspecified site of right female breast: Secondary | ICD-10-CM

## 2015-11-03 LAB — CBC WITH DIFFERENTIAL/PLATELET
BASO%: 0.4 % (ref 0.0–2.0)
BASOS ABS: 0 10*3/uL (ref 0.0–0.1)
EOS%: 2.8 % (ref 0.0–7.0)
Eosinophils Absolute: 0.2 10*3/uL (ref 0.0–0.5)
HEMATOCRIT: 44.3 % (ref 34.8–46.6)
HEMOGLOBIN: 14.6 g/dL (ref 11.6–15.9)
LYMPH#: 3.1 10*3/uL (ref 0.9–3.3)
LYMPH%: 42.3 % (ref 14.0–49.7)
MCH: 29.5 pg (ref 25.1–34.0)
MCHC: 33.1 g/dL (ref 31.5–36.0)
MCV: 89.3 fL (ref 79.5–101.0)
MONO#: 0.4 10*3/uL (ref 0.1–0.9)
MONO%: 5.8 % (ref 0.0–14.0)
NEUT#: 3.5 10*3/uL (ref 1.5–6.5)
NEUT%: 48.7 % (ref 38.4–76.8)
Platelets: 221 10*3/uL (ref 145–400)
RBC: 4.96 10*6/uL (ref 3.70–5.45)
RDW: 13.4 % (ref 11.2–14.5)
WBC: 7.2 10*3/uL (ref 3.9–10.3)

## 2015-11-03 LAB — COMPREHENSIVE METABOLIC PANEL
ALBUMIN: 3.7 g/dL (ref 3.5–5.0)
ALK PHOS: 82 U/L (ref 40–150)
ALT: 41 U/L (ref 0–55)
AST: 34 U/L (ref 5–34)
Anion Gap: 9 mEq/L (ref 3–11)
BUN: 12.7 mg/dL (ref 7.0–26.0)
CALCIUM: 9.4 mg/dL (ref 8.4–10.4)
CO2: 25 mEq/L (ref 22–29)
CREATININE: 0.8 mg/dL (ref 0.6–1.1)
Chloride: 109 mEq/L (ref 98–109)
EGFR: 83 mL/min/{1.73_m2} — ABNORMAL LOW (ref >90)
Glucose: 87 mg/dl (ref 70–140)
Potassium: 3.9 mEq/L (ref 3.5–5.1)
Sodium: 142 mEq/L (ref 136–145)
Total Bilirubin: 0.7 mg/dL (ref 0.20–1.20)
Total Protein: 6.9 g/dL (ref 6.4–8.3)

## 2015-11-06 ENCOUNTER — Ambulatory Visit: Payer: Federal, State, Local not specified - PPO | Admitting: Oncology

## 2015-11-06 NOTE — Progress Notes (Signed)
no show

## 2015-11-07 ENCOUNTER — Other Ambulatory Visit: Payer: Federal, State, Local not specified - PPO

## 2015-11-07 ENCOUNTER — Ambulatory Visit: Payer: Federal, State, Local not specified - PPO | Admitting: Gynecology

## 2015-11-07 ENCOUNTER — Telehealth: Payer: Self-pay | Admitting: Oncology

## 2015-11-07 NOTE — Telephone Encounter (Signed)
Returned patient call re rescheduling missed appointment. Gave patient new appointment for f/u with GM 3/9 @ 1 pm.

## 2015-11-12 ENCOUNTER — Other Ambulatory Visit: Payer: Federal, State, Local not specified - PPO

## 2015-11-12 ENCOUNTER — Ambulatory Visit: Payer: Federal, State, Local not specified - PPO | Admitting: Gynecology

## 2015-11-19 ENCOUNTER — Other Ambulatory Visit: Payer: Self-pay | Admitting: Women's Health

## 2015-11-19 DIAGNOSIS — Z853 Personal history of malignant neoplasm of breast: Secondary | ICD-10-CM

## 2015-11-19 DIAGNOSIS — N83202 Unspecified ovarian cyst, left side: Secondary | ICD-10-CM

## 2015-11-20 ENCOUNTER — Encounter: Payer: Self-pay | Admitting: Women's Health

## 2015-11-20 ENCOUNTER — Ambulatory Visit (INDEPENDENT_AMBULATORY_CARE_PROVIDER_SITE_OTHER): Payer: Federal, State, Local not specified - PPO | Admitting: Women's Health

## 2015-11-20 ENCOUNTER — Ambulatory Visit (HOSPITAL_BASED_OUTPATIENT_CLINIC_OR_DEPARTMENT_OTHER): Payer: Federal, State, Local not specified - PPO | Admitting: Oncology

## 2015-11-20 ENCOUNTER — Ambulatory Visit: Payer: Federal, State, Local not specified - PPO | Admitting: Gynecology

## 2015-11-20 ENCOUNTER — Ambulatory Visit (INDEPENDENT_AMBULATORY_CARE_PROVIDER_SITE_OTHER): Payer: Federal, State, Local not specified - PPO

## 2015-11-20 ENCOUNTER — Other Ambulatory Visit: Payer: Federal, State, Local not specified - PPO

## 2015-11-20 ENCOUNTER — Telehealth: Payer: Self-pay | Admitting: Oncology

## 2015-11-20 VITALS — BP 98/65 | HR 96 | Temp 97.6°F | Resp 18 | Ht <= 58 in | Wt 169.6 lb

## 2015-11-20 DIAGNOSIS — N83202 Unspecified ovarian cyst, left side: Secondary | ICD-10-CM

## 2015-11-20 DIAGNOSIS — C50911 Malignant neoplasm of unspecified site of right female breast: Secondary | ICD-10-CM

## 2015-11-20 DIAGNOSIS — Z79811 Long term (current) use of aromatase inhibitors: Secondary | ICD-10-CM

## 2015-11-20 DIAGNOSIS — Z853 Personal history of malignant neoplasm of breast: Secondary | ICD-10-CM

## 2015-11-20 DIAGNOSIS — C50211 Malignant neoplasm of upper-inner quadrant of right female breast: Secondary | ICD-10-CM

## 2015-11-20 MED ORDER — ANASTROZOLE 1 MG PO TABS: 1.0000 mg | ORAL_TABLET | Freq: Every day | ORAL | Status: DC

## 2015-11-20 NOTE — Progress Notes (Signed)
Patient ID: Nichole [name], female   DOB: 09-03-1951, 65 y.o.   MRN: [id number] Presents for follow-up ultrasound. 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 07/2014 ultrasound normal ovaries, endometrium 9.2 negative endometrial biopsy. 07/2015 endometrium 5.5 with left ovarian cyst 27 x 20 x 20 mm, ROMA 1 negative. No complaints today. Lives in [place], [place] on returning back to [place] in July.  Ultrasound: T/V and T/A anteverted uterus no change from previous scan. Endometrium 2.3 mm.   Right ovary not seen transvaginally or T/A. Left ovary rim of tissue seen with arterial BF, continued presence of thin-walled cyst 27 x 28 x 27 mm echo-free with solid calcifications adjacent to wall of this cyst 10 x 13 x 15 mm negative CFD. Negative CDS.  Persistent left ovarian cyst 2012 breast cancer on Arimidex   Plan: Repeat Roma 1. Planning on returning to [facility] in July if test negative will schedule surgery then, if elevated will return to [facility] earlier for BSO with Dr. Toney Rakes. Plan previously made with Dr. Toney Rakes

## 2015-11-20 NOTE — Telephone Encounter (Signed)
Order faxed to [facility] @ [phone].  12/04 @ 11

## 2015-11-20 NOTE — Telephone Encounter (Signed)
Scheduled patient appt, per pof avs report printed.   Bone Density 12/4 @ 11.  Faxed order to [facility] [phone]

## 2015-11-20 NOTE — Progress Notes (Signed)
ID: Nichole [name]   DOB: 01/18/1951  MR#: [id number]  CHE#:[id number]  PCP: Gwendolyn Grant, MD GYN: Dr Uvaldo Rising SU:  OTHER MD: Dr. Carol Ada  CHIEF COMPLAINT:  Right Breast Cancer  CURRENT TREATMENT: Anastrozole  HISTORY OF PRESENT ILLNESS: From the original intake note:  The patient herself noted a mass in her right breast and brought it to her physician's attention. She was living in [place] at that time. An ultrasound was done, showing a 1.7 cm spiculated nodule in the right breast with associated calcifications. There were no other findings of concern.  Lumpectomy was performed, with reportedly negative margins (I do not have those records). In Dr. Rosendo Gros note 11/04/2011 this tumor is described as triple positive.  The patient moved to [facility] for further treatment, and sentinel lymph node sampling in December of 2012 was negative. The original tissue obtained in [place] was reviewed, showing a 1.6 cm, grade 3 invasive ductal carcinoma. and the prognostic panel obtained in [place] showed the tumor to be estrogen receptor positive, progesterone receptor negative, and HER-2 negative in the invasive tumor (it was positive in the in situ tumor; SCT 12-30 at [facility]). (A note from that report states that HER-2 by [facility] was performed at [facility] and was reportedly positive in the invasive carcinoma. That report is not available to me today.)  The patient completed adjuvant radiation and was reassessed with an [facility] panel, which showed the tumor to be HER-2 negative, with an MIB-1 of 42%, again estrogen receptor positive and progesterone receptor negative. This panel predicted a 43% risk of recurrence at 8 years. The patient also had an Oncotype DX sent. This was again ER positive, PR negative, and HER-2 negative, and predicted a 34% risk of distant recurrence at 10 years if the patient's only adjuvant systemic treatment was tamoxifen for  5 years. With these numbers Dr. Curlene Labrum discussed adjuvant chemotherapy with the patient, and proposed carboplatin, docetaxel, and Herceptin, which was started on February 2013. On a note from 11/12/2011, Dr. Curlene Labrum states "even though the HER-2-neu is negative, I am still opting for Herceptin based on the aggressiveness of the tumor." The chemotherapy was completed in June 2013. Herceptin was continued, the plan being to complete one year.   The patient's subsequent history is as detailed below.  INTERVAL HISTORY: Nichole [name] returns today for followup of her estrogen receptor positivebreast cancer accompanied by her husband [name]. She continues on anastrozole, which she tolerates generally well. She did not bring enough medication with her when she last traveled to [place], and the medicine that cluster $10 here cluster $274 therefore one month.  Aside from the cost issue, which she has now resolved, she has occasional hot flashes him a which wake her up at night. She is planning to change the anastrozole from morning to evening to see if that makes a difference. REVIEW OF SYSTEMS: Nichole [name] currently he is not exercising. She was going to the gym previously but right now she wonders around in the morning and doesn't do much in the afternoon. She is gaining some weight. She feels her breasts are 2 droopy but she does not want to do any surgery to [name] that problem. She took herself off the venlafaxine. She did not call us regarding that. She was on a very low dose so even though she did not taper off as we would've instructed she had no problems from that.She is hoping to travel to [place] possibly with a  church group. Aside from these issues a detailed review of systems today was benign.   PAST MEDICAL HISTORY: Past Medical History  Diagnosis Date  . Hyperlipidemia     no current med.  Marland Kitchen History of breast cancer 07/2011    right  . History of chemotherapy 11/2011  . 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 Medial epicondylitis 05/2013    left  . Cancer [facility]) december 2012    right breast    PAST SURGICAL HISTORY: Past Surgical History  Procedure Laterality Date  . Sentinel lymph node biopsy Right 08/2011  . Breast lumpectomy Right 08/2011  . Portacath placement  2013  . Port-a-cath removal  06/2012  . Carpal tunnel release Left 06/19/2013    Procedure: CARPAL TUNNEL RELEASE;  Surgeon: Cammie Sickle., MD;  Location: [place];  Service: Orthopedics;  Laterality: Left;    FAMILY HISTORY Family History  Problem Relation Age of Onset  . Colon cancer Mother     dx postpartum  . Congestive Heart Failure Mother   . Alzheimer's disease Mother   . Diabetes Father   . Breast cancer Cousin 20  . Diabetes Paternal Grandmother   . Breast cancer Maternal Aunt   . Breast cancer Maternal Aunt    The patient's father died at the age of 42. The patient's mother died at the age of 26 after a fall, in the setting of Alzheimer's disease. The patient has no siblings. There are some second-degree relatives with breast cancer. She has been tested for the BRCA 1 and 2 mutations, and was found to be negative.  GYNECOLOGIC HISTORY: Menarche age 5. First live birth age 46. She is GX P4. Last menstrual period 2006. Status post hormone replacement, discontinued November 2012.  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 Her husband, [name], recently retired from the [employer]. This is a second marriage for both of them. Pat's 4 children are [name], 41, who lives in [place] and works as a Cabin crew; [name], who works as a Radio broadcast assistant for an Engineer, production firm in [place]; [name], who works as an Chief Financial Officer in [place]; and the youngest, [name], 28, who works as a Scientist, clinical (histocompatibility and immunogenetics) in [place]. The patient has 4 grandchildren and one on  the way. She is a Nurse, learning disability.  ADVANCED DIRECTIVES: not in place  HEALTH MAINTENANCE:  (Updated November 2014) Social History  Substance Use Topics  . Smoking status: Former Smoker    Quit date: 05/23/1992  . Smokeless tobacco: Never Used  . Alcohol Use: No     Colonoscopy: Oct 2013/Dr. Hung (Repeat in 2018)  PAP:  Oct 2014, Dr. Toney Rakes  Bone density:  Lipid panel:  Nov 2014, elevated    Allergies  Allergen Reactions  . Pork-Derived Products Rash    Current Outpatient Prescriptions  Medication Sig Dispense Refill  . anastrozole (ARIMIDEX) 1 MG tablet Take 1 tablet (1 mg total) by mouth daily. 90 tablet 12  . Calcium Carb-Cholecalciferol [id number] MG-UNIT CAPS Take 1 tablet by mouth 2 (two) times daily.    . rosuvastatin (CRESTOR) 5 MG tablet Take 1 tablet (5 mg total) by mouth daily. 30 tablet 1  . venlafaxine XR (EFFEXOR-XR) 37.5 MG 24 hr capsule Take 1 capsule (37.5 mg total) by mouth daily with breakfast. (Patient not taking: Reported on 07/16/2015) 90 capsule 3   No current  facility-administered medications for this visit.    OBJECTIVE: Middle-aged white woman in no acute distress. Her Filed Vitals:   11/20/15 1304  BP: 98/65  Pulse: 96  Temp: 97.6 F (36.4 C)  Resp: 18     Body mass index is 35.46 kg/(m^2).    ECOG FS: 0 Filed Weights   11/20/15 1304  Weight: 169 lb 9.6 oz (76.93 kg)   Sclerae unicteric, EOMs intact Oropharynx clear, dentition in good repair No cervical or supraclavicular adenopathy Lungs no rales or rhonchi Heart regular rate and rhythm Abd soft, nontender, positive bowel sounds MSK no focal spinal tenderness, no upper extremity lymphedema Neuro: nonfocal, well oriented, appropriate affect Breasts: The right breast is status post lumpectomy and radiation. There is no evidence of disease recurrence. The left breast is unremarkable. Both axillae are benign. Skin: she has a firm subcutaneous mass of approximately 6 cm below the left breast in  the abdomenjust medial to the midclavicular line. This has been present without change for several months at least there is no erythema or tenderness.  LAB RESULTS: Lab Results  Component Value Date   WBC 7.2 11/03/2015   NEUTROABS 3.5 11/03/2015   HGB 14.6 11/03/2015   HCT 44.3 11/03/2015   MCV 89.3 11/03/2015   PLT 221 11/03/2015      Chemistry      Component Value Date/Time   NA 142 11/03/2015 1136   NA 140 05/24/2014 1650   K 3.9 11/03/2015 1136   K 4.0 05/24/2014 1650   CL 103 05/24/2014 1650   CL 110* 01/08/2013 0821   CO2 25 11/03/2015 1136   CO2 31 05/24/2014 1650   BUN 12.7 11/03/2015 1136   BUN 13 05/24/2014 1650   CREATININE 0.8 11/03/2015 1136   CREATININE 0.7 05/24/2014 1650      Component Value Date/Time   CALCIUM 9.4 11/03/2015 1136   CALCIUM 9.8 05/24/2014 1650   ALKPHOS 82 11/03/2015 1136   ALKPHOS 67 05/24/2014 1650   AST 34 11/03/2015 1136   AST 33 05/24/2014 1650   ALT 41 11/03/2015 1136   ALT 41* 05/24/2014 1650   BILITOT 0.70 11/03/2015 1136   BILITOT 0.3 05/24/2014 1650     Lipid Panel     Component Value Date/Time   CHOL 162 08/27/2015 1141   TRIG 122 08/27/2015 1141   HDL 56 08/27/2015 1141   CHOLHDL 2.9 08/27/2015 1141   VLDL 24 08/27/2015 1141   LDLCALC 82 08/27/2015 1141      STUDIES: No results found.  ASSESSMENT: 65 y.o. BRCA negative [place] woman recently moved to [facility], s/p Right lumpectomy December 2012 for a pT1c pN0, stage 1A invasive ductal carcinoma, grade 3, estrogen receptor positive, progesterone receptor negative, with a Ki-67 of 42%  (1) Oncotype DX recurrence score 54% predicting a 10-year risk of distant recurrence of 34% if the only adjuvant systemic treatment is tamoxifen x 5 years  (1) s/p adjuvant radiation completed February 2013  (2) s/p six cycles of carboplatin/ docetaxel/ trastuzumab completed June 2012  (3) started tamoxifen 05/14/2012, discontinued September 2015  (4) started  anastrozole 08/13/2014  (a) bone density obtained in a [facility]  08/06/2014 shows a T score of -1.8  (5)  Status post endometrial biopsy in October 2014 for a thickened endometrial lining, pathology benign   (a) repeat transvaginal ultrasound 08/06/2015 found an endometrial stripe measuring 5.5 cm.  PLAN:  Nichole [name] is now little over 4 years out from her definitive surgery with no  evidence of disease recurrence. This is very favorable.  The plan is to continue on anti-estrogens for a total of 5 years, which is going to take [place] to September 2018.   She will be due for repeat bone density in December and I have entered those orders.  Nichole [name] is not exercising regularly but I really think she needs to start if she wants to remain in good health. I gave her information on the [facility] program. She will let me know as she likes it when she returns to see me in one year. She will call with any other problems that may develop before that visit.    Chauncey Cruel, MD     11/20/2015

## 2015-11-20 NOTE — Patient Instructions (Signed)
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 Los ovarios son los rganos pequeos que producen vulos en las mujeres. Las trompas de Falopio son los conductos que transportan los vulos desde los ovarios hasta la matriz (tero). Una salpingooferectoma unilateral puede indicarse por diferentes motivos, entre ellos:  Infeccin en la trompa de Falopio y [place].  Tejido cicatrizal en la trompa de Falopio y el ovario (adherencias).  Quiste o tumor en el ovario.  Necesidad de extirpar la trompa de Falopio y el ovario al eliminar el tero.  Cncer de la trompa de Falopio u rganos cercanos. La extirpacin de una trompa de Falopio y un ovario no evitar que quede [place], no la har entrar en la menopausia ni causar problemas con los perodos menstruales ni el impulso sexual. Catha Nottingham A SU MDICO:  Cualquier alergia que tenga.  Todos los [employer] [place], incluyendo vitaminas, hierbas, gotas oftlmicas, cremas y medicamentos de venta libre.  Problemas previos que usted o los [employer] de su familia hayan tenido con el uso de anestsicos.  Enfermedades de [employer].  Cirugas previas.  Padecimientos mdicos. RIESGOS Y COMPLICACIONES  Generalmente es un procedimiento seguro. Sin embargo, Games developer procedimiento, pueden surgir complicaciones. Las complicaciones posibles son:  [employer] rganos circundantes.  Hemorragias.  Infeccin.  Cogulos de [employer] piernas o los pulmones.  Problemas relacionados con la anestesia. ANTES DEL PROCEDIMIENTO  Consulte a su mdico si debe cambiar o suspender los medicamentos que toma habitualmente. Es posible que deba dejar de tomar ciertos medicamentos antes de la [place].  No debe comer ni beber nada durante al menos 8 horas antes de la [place].  Si fuma, no lo haga al [employer] previas a  la [place].  Haga arreglos para que alguien lo lleve a su casa despus del procedimiento o de la hospitalizacin. Tambin pdale a alguna persona que lo ayude con sus actividades mientras se recupera. PROCEDIMIENTO  Antes del procedimiento le darn un medicamento para que pueda relajarse (sedante). Le administrarn medicamentos para hacerlo dormir durante el procedimiento (anestesia general). Este medicamento se aplica a travs de una va intravenosa (IV) que se coloca en una vena.  Cuando est dormida, le higienizarn y rasurarn el abdomen. Le colocarn un tubo flexible (catter)en la vejiga.  El [place] podr usar una tcnica laparoscpica o Ardelia Mems ciruga abierta.  En la tcnica laparoscpica, la ciruga se realiza a travs de dos pequeos cortes (incisiones) en el abdomen. Se inserta un tubo delgado que emite luz y que posee una cmara (laparoscopio) en una de las incisiones. A travs de estos tubos se coloca el instrumental necesario para los procedimientos.  Podr optarse por una tcnica robtica para realizar una ciruga compleja en un espacio pequeo. En la tcnica robtica se realizan pequeas incisiones. A travs de las incisiones se insertar una cmara e instrumentos quirrgicos. Los instrumentos quirrgicos se controlarn con la ayuda de un brazo robtico.  En la tcnica abierta, la ciruga se realiza a travs de una gran incisin en el abdomen.  Al utilizar cualquiera de estas tcnicas, el cirujano extirpa la trompa de Falopio y [place]. Los vasos sanguneos se pinzarn y Armed forces operational officer.  El cirujano cerrar la incisin con grapas o puntos de sutura. DESPUS DEL PROCEDIMIENTO  La llevarn al rea de recuperacin donde controlarn su evolucin durante 1 a 3 horas. Le controlarn la presin arterial y el pulso con frecuencia. Permanecer  en la sala de recuperacin hasta que se encuentre estable y se despierte.  Si utilizaron la tcnica laparoscpica, Chief Financial Officer a su casa  despus de algunas horas. Es posible que sienta dolor en el hombro. Esto es normal y Art gallery manager en un [place].  Si se [place] la tcnica Scientist, research (life sciences) hospital durante un par [place].  Le darn medicamentos para el dolor si los necesita.  Le quitarn la va IV y el catter antes de darle el alta.   Esta informacin no tiene Marine scientist el consejo del mdico. Asegrese de hacerle al mdico cualquier pregunta que tenga.   Document Released: 06/26/2009 Document Revised: 09/04/2013 Elsevier Interactive Patient Education [employer].

## 2015-11-24 LAB — OVARIAN MALIGNANCY RISK-ROMA

## 2015-11-25 ENCOUNTER — Other Ambulatory Visit: Payer: Self-pay | Admitting: Women's Health

## 2015-11-25 ENCOUNTER — Other Ambulatory Visit: Payer: Federal, State, Local not specified - PPO

## 2015-11-25 DIAGNOSIS — N83202 Unspecified ovarian cyst, left side: Secondary | ICD-10-CM

## 2015-11-29 LAB — OVARIAN MALIGNANCY RISK-ROMA
CA125: 15 U/mL (ref <35)
HE4: 38 pM (ref <151)
ROMA PREMENOPAUSAL: 0.4 (ref <1.31)
ROMA Postmenopausal: 0.89 (ref <2.77)

## 2015-11-30 ENCOUNTER — Encounter: Payer: Self-pay | Admitting: Women's Health

## 2015-12-15 ENCOUNTER — Other Ambulatory Visit: Payer: Self-pay | Admitting: Oncology

## 2016-01-21 ENCOUNTER — Telehealth: Payer: Self-pay

## 2016-01-21 NOTE — Telephone Encounter (Signed)
Left message for patient to call regarding scheduling surgery for July

## 2016-02-24 ENCOUNTER — Telehealth: Payer: Self-pay

## 2016-02-24 ENCOUNTER — Other Ambulatory Visit: Payer: Self-pay | Admitting: Gynecology

## 2016-02-24 DIAGNOSIS — N83202 Unspecified ovarian cyst, left side: Secondary | ICD-10-CM

## 2016-02-24 NOTE — Telephone Encounter (Addendum)
Patient returned my call. Patient had seen [place] back in March and JF prior to that. Surgery had been recommended at that time.  She informed me that she will be [place] country July 12 and would like to have surgery after that.  She would like to schedule for August 1.  I have block time available that day and will schedule her.  Pre op appt with u/s per Dr. Moshe Salisbury with Dr. Moshe Salisbury is scheduled for 03/31/16.  Dr. Moshe Salisbury- Please send me surgery order so that I can schedule her. Thanks!

## 2016-02-24 NOTE — Telephone Encounter (Signed)
I attempted to reach patient again to discuss surgery to be scheduled in July. Lady answered but said patient not available. She took my name/Dr. JF office but declined my phone number. Said she will tell her. I will wait to hear from patient.

## 2016-02-25 NOTE — Telephone Encounter (Signed)
Of note. Patient asked me to contact her by e-mail and relay dates and times. I did that as well as let her know her ins required a co-payment for performing surgeon and that will be due at 03/31/16 visit.  I have copy of e-mail with her surgery folder. I did ask her to reply to my e-mail to let me know that she received it.

## 2016-03-26 ENCOUNTER — Ambulatory Visit: Payer: Federal, State, Local not specified - PPO | Admitting: Gynecology

## 2016-03-31 ENCOUNTER — Other Ambulatory Visit: Payer: Self-pay | Admitting: Gynecology

## 2016-03-31 ENCOUNTER — Ambulatory Visit (INDEPENDENT_AMBULATORY_CARE_PROVIDER_SITE_OTHER): Payer: Federal, State, Local not specified - PPO | Admitting: Gynecology

## 2016-03-31 ENCOUNTER — Ambulatory Visit (INDEPENDENT_AMBULATORY_CARE_PROVIDER_SITE_OTHER): Payer: Federal, State, Local not specified - PPO

## 2016-03-31 ENCOUNTER — Encounter: Payer: Self-pay | Admitting: Gynecology

## 2016-03-31 VITALS — Ht <= 58 in | Wt 173.0 lb

## 2016-03-31 DIAGNOSIS — N838 Other noninflammatory disorders of ovary, fallopian tube and broad ligament: Secondary | ICD-10-CM

## 2016-03-31 DIAGNOSIS — Z01818 Encounter for other preprocedural examination: Secondary | ICD-10-CM | POA: Diagnosis not present

## 2016-03-31 DIAGNOSIS — N83202 Unspecified ovarian cyst, left side: Secondary | ICD-10-CM | POA: Diagnosis not present

## 2016-03-31 DIAGNOSIS — N839 Noninflammatory disorder of ovary, fallopian tube and broad ligament, unspecified: Secondary | ICD-10-CM | POA: Diagnosis not present

## 2016-03-31 DIAGNOSIS — Z124 Encounter for screening for malignant neoplasm of cervix: Secondary | ICD-10-CM | POA: Diagnosis not present

## 2016-03-31 DIAGNOSIS — Z853 Personal history of malignant neoplasm of breast: Secondary | ICD-10-CM

## 2016-03-31 MED ORDER — METOCLOPRAMIDE HCL 10 MG PO TABS
10.0000 mg | ORAL_TABLET | Freq: Three times a day (TID) | ORAL | Status: DC
Start: 2016-03-31 — End: 2016-11-15

## 2016-03-31 MED ORDER — OXYCODONE-ACETAMINOPHEN 5-325 MG PO TABS: 1.0000 | ORAL_TABLET | ORAL | Status: DC | PRN

## 2016-03-31 NOTE — Patient Instructions (Signed)
Laparoscopia de diagnóstico °(Diagnostic Laparoscopy) °La laparoscopia de diagnóstico es un procedimiento que se hace para diagnosticar enfermedades en el abdomen. Durante su realización, se introduce en el abdomen un instrumento delgado del tamaño de un lápiz que tiene una luz, llamado laparoscopio, a través de una incisión. El laparoscopio le permite al médico observar los órganos internos. °INFORME A SU MÉDICO: °· Cualquier alergia que tenga. °· Todos los medicamentos que utiliza, incluidos vitaminas, hierbas, gotas oftálmicas, cremas y medicamentos de venta libre. °· Problemas previos que usted o los miembros de su familia hayan tenido con el uso de anestésicos. °· Enfermedades de la sangre que tenga. °· Si tiene cirugías previas. °· Enfermedades que tenga. °RIESGOS Y COMPLICACIONES  °En general, se trata de un procedimiento seguro. Sin embargo, es posible que haya problemas, que pueden incluir lo siguiente: °· Infección. °· Hemorragia. °· Lesiones en los órganos circundantes. °· Reacción alérgica a la anestesia usada durante el procedimiento. °ANTES DEL PROCEDIMIENTO °· No coma ni beba nada después de la medianoche anterior al procedimiento o según lo que le haya indicado el médico. °· Consulte a su médico acerca de estos temas: °¨ Cambiar o suspender los medicamentos que toma habitualmente. °¨ Tomar medicamentos, como aspirina e ibuprofeno. Estos medicamentos pueden tener un efecto anticoagulante en la sangre. No tome estos medicamentos antes del procedimiento si el médico le indica que no lo haga. °· Haga planes para que una persona lo lleve de vuelta a su casa después del procedimiento. °PROCEDIMIENTO °· Le administrarán un medicamento para ayudarlo a relajarse (sedante). °· Le administrarán un medicamento que lo hará dormir (anestesia general). °· Se inflará el abdomen con un gas, lo que facilitará la observación. °· Le practicarán incisiones pequeñas en el abdomen. °· A través de las incisiones, se  introducirán un laparoscopio y otros instrumentos pequeños en el abdomen. °· Se puede tomar una muestra de tejido de un órgano del abdomen para su análisis. °· Se retirarán los instrumentos del abdomen. °· Se extraerá el gas. °· Las incisiones se cerrarán con puntos (suturas). °DESPUÉS DEL PROCEDIMIENTO  °Le controlarán con frecuencia la presión arterial, la frecuencia cardíaca, la frecuencia respiratoria y el nivel de oxígeno en la sangre hasta que haya desaparecido el efecto de los medicamentos administrados. °  °Esta información no tiene como fin reemplazar el consejo del médico. Asegúrese de hacerle al médico cualquier pregunta que tenga. °  °Document Released: 08/30/2005 Document Revised: 09/20/2014 °Elsevier Interactive Patient Education ©2016 Elsevier Inc. ° °

## 2016-03-31 NOTE — Progress Notes (Signed)
Nichole [name] is an 65 y.o. female with personal history of breast cancer who has a history of a persistent left ovarian cyst with a solid calcification noted and normal CA 125's is scheduled for laparoscopic bilateral salpingo-oophorectomy. Her history is as follows:  She spends half of the year in [place] and brought with her information from an ultrasound that she had done which had demonstrated she had ovarian cysts on her left ovary average size 2-1/2 cm and a thickened endometrium of 4.5 mm. Patient reports no vaginal bleeding. In November of last year to discuss her pelvic ultrasound. 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 Patient is status post adjuvant radiation treatment which was completed in February of 2013. She also completed in June 2012 six cycles of carboplatin/ docetaxel/ trastuzumab. She was on Herceptin for a short while after questions were raised in reference to her HER-2 status which was believed to be negative. She was taken off the tamoxifen and has been on anastrozole 1 mg daily.Patient was also BRCA negative.  Her ultrasound on November 2015 in the office demonstrated following: Uterus measured 8.1 x 5.1 x 3.9 cm with endometrial stripe of 9.2 mm. Her thickened endometrium was avascular. Homogeneous echo pattern. Right ovary located above the uterus was normal left ovary was normal. No fluid in the cul-de-sac.  She had an endometrial biopsy in 2014 and 2015 as a result of her thickened endometrium while she had been on tamoxifen as well as on anastrozole and both pathology reports were negative for hyperplasia and malignancy.  Patient denies any vaginal bleeding. Ultrasound November 2016 demonstrated the following: Uterus measured 5.4 x 4.5 x 3.2 cm with endometrial stripe of 5.5 mm. A left  ovarian echo-free thick wall a vascular mass with calcification within it measuring 27 x 20 x 20 mm. Right ovary not identified but right adnexa was normal. There was no fluid in the cul-de-sac noted.  her CA 125 in 2016 was 14 and CA 125 in 2017 was 15 ROMA-1 2017 normal   Ultrasound today: Uterus measures 6.5 x 4.1 x 2.9 cm within a major stripe of 1.9 mm. Right ovary was normal left ovary continued presence of thinwall cystic mass measuring 28 x 24 x 22 mm with a solid calcification measuring 15 x 13 x 17 mm negative color flow. Arterial blood flow was seen to the ovary. No fluid in the cul-de-sac  Pertinent Gynecological History: Menses: post-menopausal Bleeding: none Contraception: post menopausal status DES exposure: unknown Blood transfusions: Postpartum hemorrhage over 40 years ago had transfusion Sexually transmitted diseases:  Past history of chlamydia Previous GYN Procedures: 3 vaginal deliveries one cesarean section  Last mammogram: normal Date:  2016 Last pap: normal Date:  2014 OB History:   gravida 4 para 4  Menstrual History: Menarche ag No LMP recorded. Patient is postmenopausal.    Past Medical History  Diagnosis Date  . Hyperlipidemia     no current med.  Marland Kitchen History of breast cancer 07/2011    right  . History of chemotherapy 11/2011  . 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 Medial epicondylitis 05/2013    left  . Cancer [facility]) december 2012    right breast    Past Surgical History  Procedure Laterality Date  . Sentinel lymph node biopsy Right 08/2011  . Breast  lumpectomy Right 08/2011  . Portacath placement  2013  . Port-a-cath removal  06/2012  . Carpal tunnel release Left 06/19/2013    Procedure: CARPAL TUNNEL RELEASE;  Surgeon: Cammie Sickle., MD;  Location: [facility];  Service: Orthopedics;  Laterality: Left;    Family History  Problem Relation Age of Onset  . Colon cancer Mother     dx postpartum  . Congestive  Heart Failure Mother   . Alzheimer's disease Mother   . Diabetes Father   . Breast cancer Cousin 19  . Diabetes Paternal Grandmother   . Breast cancer Maternal Aunt   . Breast cancer Maternal Aunt     Social History:  reports that she quit smoking about 23 years ago. She has never used smokeless tobacco. She reports that she does not drink alcohol or use illicit drugs.  Allergies:  Allergies  Allergen Reactions  . Penicillins Rash    Has patient had a PCN reaction causing immediate rash, facial/tongue/throat swelling, SOB or lightheadedness with hypotension: No Has patient had a PCN reaction causing severe rash involving mucus membranes or skin necrosis: No Has patient had a PCN reaction that required hospitalization No Has patient had a PCN reaction occurring within the last 10 years: Yes If all of the above answers are "NO", then may proceed with Cephalosporin use.   . Pork-Derived Products Rash     (Not in a hospital admission)  REVIEW OF SYSTEMS: A ROS was performed and pertinent positives and negatives are included in the history.  GENERAL: No fevers or chills. HEENT: No change in vision, no earache, sore throat or sinus congestion. NECK: No pain or stiffness. CARDIOVASCULAR: No chest pain or pressure. No palpitations. PULMONARY: No shortness of breath, cough or wheeze. GASTROINTESTINAL: No abdominal pain, nausea, vomiting or diarrhea, melena or bright red blood per rectum. GENITOURINARY: No urinary frequency, urgency, hesitancy or dysuria. MUSCULOSKELETAL: No joint or muscle pain, no back pain, no recent trauma. DERMATOLOGIC: No rash, no itching, no lesions. ENDOCRINE: No polyuria, polydipsia, no heat or cold intolerance. No recent change in weight. HEMATOLOGICAL: No anemia or easy bruising or bleeding. NEUROLOGIC: No headache, seizures, numbness, tingling or weakness. PSYCHIATRIC: No depression, no loss of interest in normal activity or change in sleep pattern.     Height 4'  10" (1.473 m), weight 173 lb (78.472 kg).  Physical Exam:  HEENT:unremarkable Neck:Supple, midline, no thyroid megaly, no carotid bruits Lungs:  Clear to auscultation no rhonchi's or wheezes Heart:Regular rate and rhythm, no murmurs or gallops   Breast: Symmetrical in appearance no palpable mass or tenderness no supraclavicular axillary lymphadenopathy no nipple discharge Abdomen: soft nontender no rebound or guarding Pelvic:BUSwithin normal limits Vagina: atrophic changes Cervix: no lesions or discharge Uterus: anteverted normal size shape and consistency Adnexa:no palpable masses or tenderness Extremities: No cords, no edema Rectal:not done  No results found for this or any previous visit (from the past 24 hour(s)).  No results found.  Assessment/Plan:  patient with past history of breast cancer with persistent left ovarian cyst with solid region noted on the cyst. Normal CA 125's and ROMA-1  Will be scheduled for laparoscopic bilateral salpingo-oophorectomy. Literature information was provided in Spanish the following riskfor surgery were discussed                      Patient was counseled as to the risk of surgery to include the following:  1. Infection (prohylactic antibiotics will be administered)  2. DVT/Pulmonary Embolism (prophylactic pneumo compression stockings will be used)  3.Trauma to internal organs requiring additional surgical procedure to repair any injury to     Internal organs requiring perhaps additional hospitalization days.  4.Hemmorhage requiring transfusion and blood products which carry risks such as             anaphylactic reaction, hepatitis and AIDS  Patient had received literature information on the procedure scheduled and all her questions were answered and fully accepts all risk.   [facility] HMD1:56 PMTD     Terrance Mass 03/31/2016, 12:46 PM

## 2016-04-01 LAB — PAP IG W/ RFLX HPV ASCU

## 2016-04-05 ENCOUNTER — Encounter (HOSPITAL_COMMUNITY)
Admission: RE | Admit: 2016-04-05 | Discharge: 2016-04-05 | Disposition: A | Discharge status: Home / Self Care | Payer: Federal, State, Local not specified - PPO | Source: Ambulatory Visit | Attending: Gynecology | Admitting: Gynecology

## 2016-04-05 ENCOUNTER — Encounter (HOSPITAL_COMMUNITY): Payer: Self-pay

## 2016-04-05 DIAGNOSIS — Z01818 Encounter for other preprocedural examination: Secondary | ICD-10-CM | POA: Diagnosis not present

## 2016-04-05 LAB — CBC
HEMATOCRIT: 43 % (ref 36.0–46.0)
HEMOGLOBIN: 14.5 g/dL (ref 12.0–15.0)
MCH: 29.6 pg (ref 26.0–34.0)
MCHC: 33.7 g/dL (ref 30.0–36.0)
MCV: 87.8 fL (ref 78.0–100.0)
Platelets: 247 10*3/uL (ref 150–400)
RBC: 4.9 MIL/uL (ref 3.87–5.11)
RDW: 13.8 % (ref 11.5–15.5)
WBC: 7.2 10*3/uL (ref 4.0–10.5)

## 2016-04-05 NOTE — Patient Instructions (Addendum)
Your procedure is scheduled on: Tuesday, 8/1  Enter through the Main Entrance of [facility] at: 6AM  Pick up the phone at the desk and dial [phone].  Call this number if you have problems the morning of surgery: [phone].  Remember:  Do NOT eat or drink after midnight Monday 7/31.  Take these medicines the morning of surgery with a SIP OF WATER:  Anastrozole and Crestor.  Do NOT wear jewelry (body piercing), metal hair clips/bobby pins, make-up, or nail polish. Do NOT wear lotions, powders, or perfumes.  You may wear deoderant. Do NOT shave for 48 hours prior to surgery. Do NOT bring valuables to the hospital.   Have a responsible adult drive you home and stay with you for 24 hours after your procedure. Home with husband [name] cell [phone]

## 2016-04-06 ENCOUNTER — Inpatient Hospital Stay (HOSPITAL_COMMUNITY): Admission: RE | Admit: 2016-04-06 | Payer: Federal, State, Local not specified - PPO | Source: Ambulatory Visit

## 2016-04-12 ENCOUNTER — Other Ambulatory Visit (HOSPITAL_COMMUNITY): Payer: Federal, State, Local not specified - PPO

## 2016-04-12 ENCOUNTER — Encounter (HOSPITAL_COMMUNITY): Payer: Self-pay | Admitting: Anesthesiology

## 2016-04-12 MED ORDER — DEXTROSE 5 % IV SOLN
2.0000 g | INTRAVENOUS | Status: AC
  Administered 2016-04-13: 2 g via INTRAVENOUS
  Filled 2016-04-12: qty 2

## 2016-04-12 NOTE — H&P (Signed)
Nichole [name] is an 65 y.o. female with personal history of breast cancer who has a history of a persistent left ovarian cyst with a solid calcification noted and normal CA 125's is scheduled for laparoscopic bilateral salpingo-oophorectomy. Her history is as follows:  She spends half of the year in [place] and brought with her information from an ultrasound that she had done which had demonstrated she had ovarian cysts on her left ovary average size 2-1/2 cm and a thickened endometrium of 4.5 mm. Patient reports no vaginal bleeding. In November of last year to discuss her pelvic ultrasound. 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 Patient is status post adjuvant radiation treatment which was completed in February of 2013. She also completed in June 2012 six cycles of carboplatin/ docetaxel/ trastuzumab. She was on Herceptin for a short while after questions were raised in reference to her HER-2 status which was believed to be negative. She was taken off the tamoxifen and has been on anastrozole 1 mg daily.Patient was also BRCA negative.  Her ultrasound on November 2015 in the office demonstrated following: Uterus measured 8.1 x 5.1 x 3.9 cm with endometrial stripe of 9.2 mm. Her thickened endometrium was avascular. Homogeneous echo pattern. Right ovary located above the uterus was normal left ovary was normal. No fluid in the cul-de-sac.  She had an endometrial biopsy in 2014 and 2015 as a result of her thickened endometrium while she had been on tamoxifen as well as on anastrozole and both pathology reports were negative for hyperplasia and malignancy.  Patient denies any vaginal bleeding. Ultrasound November 2016 demonstrated the following: Uterus measured 5.4 x 4.5 x 3.2 cm with endometrial stripe of 5.5 mm. A left  ovarian echo-free thick wall a vascular mass with calcification within it measuring 27 x 20 x 20 mm. Right ovary not identified but right adnexa was normal. There was no fluid in the cul-de-sac noted.  her CA 125 in 2016 was 14 and CA 125 in 2017 was 15 ROMA-1 2017 normal   Ultrasound today: Uterus measures 6.5 x 4.1 x 2.9 cm within a major stripe of 1.9 mm. Right ovary was normal left ovary continued presence of thinwall cystic mass measuring 28 x 24 x 22 mm with a solid calcification measuring 15 x 13 x 17 mm negative color flow. Arterial blood flow was seen to the ovary. No fluid in the cul-de-sac  Pertinent Gynecological History: Menses: post-menopausal Bleeding: none Contraception: post menopausal status DES exposure: unknown Blood transfusions: Postpartum hemorrhage over 40 years ago had transfusion Sexually transmitted diseases:  Past history of chlamydia Previous GYN Procedures: 3 vaginal deliveries one cesarean section  Last mammogram: normal Date:  2016 Last pap: normal Date:  2014 OB History:                 gravida 4 para 4  Menstrual History: Menarche ag No LMP recorded. Patient is postmenopausal.         Past Medical History  Diagnosis Date  . Hyperlipidemia     no current med.  Marland Kitchen History of breast cancer 07/2011    right  . History of chemotherapy 11/2011  . 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 Medial epicondylitis 05/2013    left  . Cancer [facility]) december 2012    right breast  Past Surgical History  Procedure Laterality Date  . Sentinel lymph node biopsy Right 08/2011  . Breast lumpectomy Right 08/2011  . Portacath placement  2013  . Port-a-cath removal  06/2012  . Carpal tunnel release Left 06/19/2013    Procedure: CARPAL TUNNEL RELEASE;  Surgeon: Cammie Sickle., MD;  Location: [facility];  Service: Orthopedics;  Laterality: Left;          Family History  Problem Relation Age of Onset  .  Colon cancer Mother     dx postpartum  . Congestive Heart Failure Mother   . Alzheimer's disease Mother   . Diabetes Father   . Breast cancer Cousin 33  . Diabetes Paternal Grandmother   . Breast cancer Maternal Aunt   . Breast cancer Maternal Aunt     Social History:  reports that she quit smoking about 23 years ago. She has never used smokeless tobacco. She reports that she does not drink alcohol or use illicit drugs.  Allergies:       Allergies  Allergen Reactions  . Penicillins Rash    Has patient had a PCN reaction causing immediate rash, facial/tongue/throat swelling, SOB or lightheadedness with hypotension: No Has patient had a PCN reaction causing severe rash involving mucus membranes or skin necrosis: No Has patient had a PCN reaction that required hospitalization No Has patient had a PCN reaction occurring within the last 10 years: Yes If all of the above answers are "NO", then may proceed with Cephalosporin use.  . Pork-Derived Products Rash     (Not in a hospital admission)  REVIEW OF SYSTEMS: A ROS was performed and pertinent positives and negatives are included in the history.  GENERAL: No fevers or chills. HEENT: No change in vision, no earache, sore throat or sinus congestion. NECK: No pain or stiffness. CARDIOVASCULAR: No chest pain or pressure. No palpitations. PULMONARY: No shortness of breath, cough or wheeze. GASTROINTESTINAL: No abdominal pain, nausea, vomiting or diarrhea, melena or bright red blood per rectum. GENITOURINARY: No urinary frequency, urgency, hesitancy or dysuria. MUSCULOSKELETAL: No joint or muscle pain, no back pain, no recent trauma. DERMATOLOGIC: No rash, no itching, no lesions. ENDOCRINE: No polyuria, polydipsia, no heat or cold intolerance. No recent change in weight. HEMATOLOGICAL: No anemia or easy bruising or bleeding. NEUROLOGIC: No headache, seizures, numbness, tingling or weakness. PSYCHIATRIC: No depression, no loss of  interest in normal activity or change in sleep pattern.     Height 4' 10" (1.473 m), weight 173 lb (78.472 kg).  Physical Exam:  HEENT:unremarkable Neck:Supple, midline, no thyroid megaly, no carotid bruits Lungs:  Clear to auscultation no rhonchi's or wheezes Heart:Regular rate and rhythm, no murmurs or gallops   Breast: Symmetrical in appearance no palpable mass or tenderness no supraclavicular axillary lymphadenopathy no nipple discharge Abdomen: soft nontender no rebound or guarding Pelvic:BUSwithin normal limits Vagina: atrophic changes Cervix: no lesions or discharge Uterus: anteverted normal size shape and consistency Adnexa:no palpable masses or tenderness Extremities: No cords, no edema Rectal:not done  Lab Results Last 24 Hours  No results found for this or any previous visit (from the past 24 hour(s)).    Imaging Results (Last 48 hours)  No results found.    Assessment/Plan:  patient with past history of breast cancer with persistent left ovarian cyst with solid region noted on the cyst. Normal CA 125's and ROMA-1  Will be scheduled for laparoscopic bilateral salpingo-oophorectomy. Literature information was provided in Spanish the following riskfor surgery were  discussed                      Patient was counseled as to the risk of surgery to include the following:  1. Infection (prohylactic antibiotics will be administered)  2. DVT/Pulmonary Embolism (prophylactic pneumo compression stockings will be used)  3.Trauma to internal organs requiring additional surgical procedure to repair any injury to     Internal organs requiring perhaps additional hospitalization days.  4.Hemmorhage requiring transfusion and blood products which carry risks such as             anaphylactic reaction, hepatitis and AIDS  Patient had received literature information on the procedure scheduled and all her questions were answered and fully accepts all  risk.   [facility] HMD1:56 PMTD

## 2016-04-13 ENCOUNTER — Ambulatory Visit (HOSPITAL_COMMUNITY): Payer: Federal, State, Local not specified - PPO | Admitting: Anesthesiology

## 2016-04-13 ENCOUNTER — Ambulatory Visit (HOSPITAL_COMMUNITY)
Admission: RE | Admit: 2016-04-13 | Discharge: 2016-04-13 | Disposition: A | Discharge status: Home / Self Care | Payer: Federal, State, Local not specified - PPO | Source: Ambulatory Visit | Attending: Gynecology | Admitting: Gynecology

## 2016-04-13 ENCOUNTER — Encounter (HOSPITAL_COMMUNITY)
Admission: RE | Disposition: A | Discharge status: Home / Self Care | Payer: Self-pay | Source: Ambulatory Visit | Attending: Gynecology

## 2016-04-13 ENCOUNTER — Encounter (HOSPITAL_COMMUNITY): Payer: Self-pay

## 2016-04-13 DIAGNOSIS — N838 Other noninflammatory disorders of ovary, fallopian tube and broad ligament: Secondary | ICD-10-CM | POA: Diagnosis not present

## 2016-04-13 DIAGNOSIS — N83202 Unspecified ovarian cyst, left side: Secondary | ICD-10-CM | POA: Diagnosis not present

## 2016-04-13 DIAGNOSIS — Z87891 Personal history of nicotine dependence: Secondary | ICD-10-CM | POA: Diagnosis not present

## 2016-04-13 DIAGNOSIS — N9489 Other specified conditions associated with female genital organs and menstrual cycle: Secondary | ICD-10-CM | POA: Diagnosis not present

## 2016-04-13 DIAGNOSIS — Z853 Personal history of malignant neoplasm of breast: Secondary | ICD-10-CM | POA: Diagnosis not present

## 2016-04-13 DIAGNOSIS — D271 Benign neoplasm of left ovary: Secondary | ICD-10-CM | POA: Insufficient documentation

## 2016-04-13 DIAGNOSIS — Z880 Allergy status to penicillin: Secondary | ICD-10-CM | POA: Diagnosis not present

## 2016-04-13 HISTORY — PX: LAPAROSCOPIC SALPINGO OOPHERECTOMY: SHX5927

## 2016-04-13 SURGERY — SALPINGO-OOPHORECTOMY, LAPAROSCOPIC
Anesthesia: General | Site: Abdomen | Laterality: Bilateral

## 2016-04-13 MED ORDER — MEPERIDINE HCL 25 MG/ML IJ SOLN: 6.2500 mg | INTRAMUSCULAR | Status: DC | PRN

## 2016-04-13 MED ORDER — SUGAMMADEX SODIUM 200 MG/2ML IV SOLN
INTRAVENOUS | Status: AC
  Filled 2016-04-13: qty 2

## 2016-04-13 MED ORDER — FENTANYL CITRATE (PF) 100 MCG/2ML IJ SOLN: 25.0000 ug | INTRAMUSCULAR | Status: DC | PRN

## 2016-04-13 MED ORDER — MIDAZOLAM HCL 2 MG/2ML IJ SOLN
INTRAMUSCULAR | Status: DC | PRN
  Administered 2016-04-13: 1 mg via INTRAVENOUS

## 2016-04-13 MED ORDER — DEXAMETHASONE SODIUM PHOSPHATE 4 MG/ML IJ SOLN
INTRAMUSCULAR | Status: AC
  Filled 2016-04-13: qty 1

## 2016-04-13 MED ORDER — DEXAMETHASONE SODIUM PHOSPHATE 10 MG/ML IJ SOLN
INTRAMUSCULAR | Status: DC | PRN
  Administered 2016-04-13: 4 mg via INTRAVENOUS

## 2016-04-13 MED ORDER — LIDOCAINE HCL (CARDIAC) 20 MG/ML IV SOLN
INTRAVENOUS | Status: DC | PRN
  Administered 2016-04-13: 100 mg via INTRAVENOUS

## 2016-04-13 MED ORDER — LACTATED RINGERS IR SOLN
Status: DC | PRN
  Administered 2016-04-13: 3000 mL

## 2016-04-13 MED ORDER — FENTANYL CITRATE (PF) 250 MCG/5ML IJ SOLN
INTRAMUSCULAR | Status: AC
  Filled 2016-04-13: qty 5

## 2016-04-13 MED ORDER — METOCLOPRAMIDE HCL 5 MG/ML IJ SOLN
INTRAMUSCULAR | Status: AC
  Administered 2016-04-13: 10 mg via INTRAVENOUS
  Filled 2016-04-13: qty 2

## 2016-04-13 MED ORDER — ONDANSETRON HCL 4 MG/2ML IJ SOLN
INTRAMUSCULAR | Status: AC
  Filled 2016-04-13: qty 2

## 2016-04-13 MED ORDER — PROPOFOL 10 MG/ML IV BOLUS
INTRAVENOUS | Status: DC | PRN
  Administered 2016-04-13: 170 mg via INTRAVENOUS

## 2016-04-13 MED ORDER — FENTANYL CITRATE (PF) 100 MCG/2ML IJ SOLN
INTRAMUSCULAR | Status: DC | PRN
  Administered 2016-04-13: 50 ug via INTRAVENOUS
  Administered 2016-04-13 (×2): 100 ug via INTRAVENOUS

## 2016-04-13 MED ORDER — EPHEDRINE SULFATE 50 MG/ML IJ SOLN
INTRAMUSCULAR | Status: DC | PRN
  Administered 2016-04-13: 5 mg via INTRAVENOUS

## 2016-04-13 MED ORDER — PROPOFOL 10 MG/ML IV BOLUS
INTRAVENOUS | Status: AC
  Filled 2016-04-13: qty 20

## 2016-04-13 MED ORDER — HEPARIN SODIUM (PORCINE) 5000 UNIT/ML IJ SOLN
INTRAMUSCULAR | Status: DC | PRN
  Administered 2016-04-13: 5000 [IU]

## 2016-04-13 MED ORDER — SUGAMMADEX SODIUM 200 MG/2ML IV SOLN
INTRAVENOUS | Status: DC | PRN
  Administered 2016-04-13: 157 mg via INTRAVENOUS

## 2016-04-13 MED ORDER — KETOROLAC TROMETHAMINE 30 MG/ML IJ SOLN
INTRAMUSCULAR | Status: AC
  Filled 2016-04-13: qty 1

## 2016-04-13 MED ORDER — BUPIVACAINE HCL (PF) 0.25 % IJ SOLN
INTRAMUSCULAR | Status: AC
  Filled 2016-04-13: qty 30

## 2016-04-13 MED ORDER — BUPIVACAINE HCL (PF) 0.25 % IJ SOLN
INTRAMUSCULAR | Status: DC | PRN
  Administered 2016-04-13: 17 mL

## 2016-04-13 MED ORDER — MIDAZOLAM HCL 2 MG/2ML IJ SOLN
INTRAMUSCULAR | Status: AC
  Filled 2016-04-13: qty 2

## 2016-04-13 MED ORDER — LACTATED RINGERS IV SOLN
INTRAVENOUS | Status: DC
  Administered 2016-04-13: 125 mL/h via INTRAVENOUS
  Administered 2016-04-13: 08:00:00 via INTRAVENOUS

## 2016-04-13 MED ORDER — ONDANSETRON HCL 4 MG/2ML IJ SOLN
4.0000 mg | Freq: Once | INTRAMUSCULAR | Status: AC | PRN
  Administered 2016-04-13: 4 mg via INTRAVENOUS

## 2016-04-13 MED ORDER — HEPARIN SODIUM (PORCINE) 5000 UNIT/ML IJ SOLN
INTRAMUSCULAR | Status: AC
  Filled 2016-04-13: qty 1

## 2016-04-13 MED ORDER — METOCLOPRAMIDE HCL 5 MG/ML IJ SOLN
10.0000 mg | Freq: Once | INTRAMUSCULAR | Status: AC
  Administered 2016-04-13: 10 mg via INTRAVENOUS

## 2016-04-13 MED ORDER — ONDANSETRON HCL 4 MG/2ML IJ SOLN
INTRAMUSCULAR | Status: DC | PRN
  Administered 2016-04-13: 4 mg via INTRAVENOUS

## 2016-04-13 MED ORDER — ROCURONIUM BROMIDE 100 MG/10ML IV SOLN
INTRAVENOUS | Status: DC | PRN
  Administered 2016-04-13: 30 mg via INTRAVENOUS

## 2016-04-13 MED ORDER — EPHEDRINE 5 MG/ML INJ
INTRAVENOUS | Status: AC
Start: 2016-04-13 — End: 2016-04-13
  Filled 2016-04-13: qty 10

## 2016-04-13 MED ORDER — KETOROLAC TROMETHAMINE 30 MG/ML IJ SOLN
30.0000 mg | Freq: Once | INTRAMUSCULAR | Status: AC
  Administered 2016-04-13: 30 mg via INTRAVENOUS

## 2016-04-13 MED ORDER — FENTANYL CITRATE (PF) 100 MCG/2ML IJ SOLN
INTRAMUSCULAR | Status: AC
  Filled 2016-04-13: qty 2

## 2016-04-13 MED ORDER — ROCURONIUM BROMIDE 100 MG/10ML IV SOLN
INTRAVENOUS | Status: AC
  Filled 2016-04-13: qty 1

## 2016-04-13 MED ORDER — METHYLENE BLUE 0.5 % INJ SOLN
INTRAVENOUS | Status: AC
  Filled 2016-04-13: qty 10

## 2016-04-13 MED ORDER — LIDOCAINE HCL (CARDIAC) 20 MG/ML IV SOLN
INTRAVENOUS | Status: AC
  Filled 2016-04-13: qty 5

## 2016-04-13 SURGICAL SUPPLY — 31 items
APPLICATOR ARISTA FLEXITIP XL (MISCELLANEOUS) ×2 IMPLANT
BARRIER ADHS 3X4 INTERCEED (GAUZE/BANDAGES/DRESSINGS) IMPLANT
CABLE HIGH FREQUENCY MONO STRZ (ELECTRODE) ×2 IMPLANT
CLOTH BEACON ORANGE TIMEOUT ST (SAFETY) ×2 IMPLANT
COVER MAYO STAND STRL (DRAPES) ×2 IMPLANT
FILTER SMOKE EVAC LAPAROSHD (FILTER) ×2 IMPLANT
GLOVE BIOGEL PI IND STRL 7.0 (GLOVE) ×1 IMPLANT
GLOVE BIOGEL PI IND STRL 8 (GLOVE) ×1 IMPLANT
GLOVE BIOGEL PI INDICATOR 7.0 (GLOVE) ×1
GLOVE BIOGEL PI INDICATOR 8 (GLOVE) ×1
GLOVE ECLIPSE 7.5 STRL STRAW (GLOVE) ×4 IMPLANT
GOWN STRL REUS W/TWL LRG LVL3 (GOWN DISPOSABLE) ×4 IMPLANT
HEMOSTAT ARISTA ABSORB 3G PWDR (MISCELLANEOUS) ×2 IMPLANT
LIQUID BAND (GAUZE/BANDAGES/DRESSINGS) ×2 IMPLANT
NS IRRIG 1000ML POUR BTL (IV SOLUTION) ×2 IMPLANT
PACK LAPAROSCOPY BASIN (CUSTOM PROCEDURE TRAY) ×2 IMPLANT
POUCH SPECIMEN RETRIEVAL 10MM (ENDOMECHANICALS) ×2 IMPLANT
RETRACT II ENDO 10MM 32CML (ENDOMECHANICALS) ×2
RETRACTOR II ENDO 10MM 32CML (ENDOMECHANICALS) ×1 IMPLANT
SET IRRIG TUBING LAPAROSCOPIC (IRRIGATION / IRRIGATOR) ×2 IMPLANT
SHEARS HARMONIC ACE PLUS 36CM (ENDOMECHANICALS) ×2 IMPLANT
SLEEVE XCEL OPT CAN 5 100 (ENDOMECHANICALS) ×2 IMPLANT
SOLUTION ELECTROLUBE (MISCELLANEOUS) IMPLANT
SUT VIC AB 3-0 PS2 18 (SUTURE) ×1
SUT VIC AB 3-0 PS2 18XBRD (SUTURE) ×1 IMPLANT
SUT VICRYL 0 UR6 27IN ABS (SUTURE) ×2 IMPLANT
TOWEL OR 17X24 6PK STRL BLUE (TOWEL DISPOSABLE) ×4 IMPLANT
TRAY FOLEY CATH SILVER 14FR (SET/KITS/TRAYS/PACK) ×2 IMPLANT
TROCAR XCEL NON-BLD 11X100MML (ENDOMECHANICALS) ×6 IMPLANT
TROCAR XCEL NON-BLD 5MMX100MML (ENDOMECHANICALS) ×2 IMPLANT
WARMER LAPAROSCOPE (MISCELLANEOUS) ×2 IMPLANT

## 2016-04-13 NOTE — Transfer of Care (Signed)
Immediate Anesthesia Transfer of Care Note  Patient: Nichole [name]  Procedure(s) Performed: Procedure(s): LAPAROSCOPIC SALPINGO OOPHORECTOMY (Bilateral)  Patient Location: PACU  Anesthesia Type:General  Level of Consciousness: awake, alert  and oriented  Airway & Oxygen Therapy: Patient Spontanous Breathing and Patient connected to nasal cannula oxygen  Post-op Assessment: Report given to RN and Post -op Vital signs reviewed and stable  Post vital signs: Reviewed and stable  Last Vitals:  Vitals:   04/13/16 0630  BP: (!) 109/50  Pulse: 74  Resp: 16  Temp: 36.6 C    Last Pain:  Vitals:   04/13/16 0630  TempSrc: Oral      Patients Stated Pain Goal: 3 ([id number])  Complications: No apparent anesthesia complications

## 2016-04-13 NOTE — Op Note (Signed)
Operative Note  04/13/2016  9:25 AM  PATIENT:  Nichole [name]  65 y.o. female  PRE-OPERATIVE DIAGNOSIS:  persistant left ovarian cyst, history of breast cancer  POST-OPERATIVE DIAGNOSIS:  persistent left ovarian cyst, hx of breast cancer  PROCEDURE:  Procedure(s): LAPAROSCOPIC SALPINGO OOPHORECTOMY  SURGEON:  Surgeon(s): Terrance Mass, MD Anastasio Auerbach, MD  ANESTHESIA:   general  FINDINGS: Left ovary slightly larger than right ovary no excrescences and seen no peritoneal abnormalities. Right tube and ovary normal left tube normal normal-appearing appendix normal liver surface gallbladder not seen   DESCRIPTION OF OPERATION: The patient was taken to the operating room where she underwent a successful general endotracheal anesthesia. She received 2 g of Cefotan IV for prophylaxis. Patient also had PSA stockings for DVT prophylaxis as well. A timeout was undertaken to identify the patient procedure to be performed. The patient slight was placed in the low lithotomy position. Exam under anesthesia demonstrated anteverted uterus no palpable masses except a slightly enlarged left ovary. Right adnexa no enlargement on bimanual exam. The cervix and vagina had been cleansed with Betadine solution and a CO2 tenaculum was placed on the anterior cervical lip. The uterus sounded to 7-1/2 cm and a single-tooth tenaculum was placed on the anterior cervical lip for manipulation during laparoscopic procedure. After the abdomen was prepped and draped in the usual sterile fashion of note Foley catheter had been inserted) a subumbilical incision was made followed by insertion of the Optiview 10/11 mm trocar. 2 additional 5 mm ports were placed on the patient's right and left abdomen under laparoscopic guidance. A systematic inspection of the pelvis demonstrated the above. Pelvic washings was obtained and submitted for cytological evaluation. The left tube and ovary were placed under tension and the  left infundibulopelvic ligament was identified and was coaptated with a Harmonic scalpel and transected. The left utero-ovarian ligament and proximal fallopian tube were also coaptated and transected with Harmonic scalpel and the remainder of the mesosalpinx was coaptated and transected with the Harmonic scalpel as well. The specimen was placed in the cul-de-sac in similar procedure was carried on the contralateral side. With these for 5 mm hysteroscope on the right lower abdomen and Endopouch was introduced through the 10/11 mm port to remove both tubes and ovaries. The specimen was retrieved passed off the operative field identified accordingly and submitted for histological evaluation. The left lower quadrant port was enlarged to allow a 10/11 mm trocar so that a laparoscopic rake could be introduced in an effort to retrieve the bowel in a cephalic manner to cauterize the right infundibulopelvic ligament that continued to bleed. Once this was completed Arista hemostatic agent was then placed over the raw surfaces of both infundibulopelvic ligament and uterus. Prior to doing this the pelvic cavity had been cut was irrigated with normal saline solution. Patient is also been obtained. The pneumoperitoneum was removed. The sub-umbilicus incision fashion was closed with a running stitch of 0 Vicryl suture and the subcuticular tissue was reapproximated with 3-0 Vicryl suture the skin edges on all 3 port sites were reapproximated with Dermabond glue but prior to this 0.25 Marcaine was infiltrated all 3 port sites for a total of 17 cc. Band-Aid dressings were placed. The single-tooth tenaculum was removed from the vagina and cervix cervix was inspected and silver nitrate was placed on his small area that was oozing from the tenaculum. The Foley catheter was then removed. Patient received Toradol 30 mg IV in route to the recovery  room. After she was extubated.   ESTIMATED BLOOD LOSS: 75   Intake/Output Summary (Last  24 hours) at 04/13/16 0925 Last data filed at 04/13/16 [id number]  Gross per 24 hour  Intake             1300 ml  Output              175 ml  Net             1125 ml     BLOOD ADMINISTERED:none   LOCAL MEDICATIONS USED:  MARCAINE     SPECIMEN:  Source of Specimen:  Bilateral tubes and ovaries and pelvic washings  DISPOSITION OF SPECIMEN:  PATHOLOGY  COUNTS:  YES  PLAN OF CARE: Transfer to PACU  [facility] HMD9:25 AMTD@

## 2016-04-13 NOTE — Interval H&P Note (Signed)
History and Physical Interval Note:  04/13/2016 7:10 AM  Nichole [name]  has presented today for surgery, with the diagnosis of persistant left ovarian cyst, history of breast cancer  The various methods of treatment have been discussed with the patient and family. After consideration of risks, benefits and other options for treatment, the patient has consented to  Procedure(s): LAPAROSCOPIC SALPINGO OOPHORECTOMY (Bilateral) as a surgical intervention .  The patient's history has been reviewed, patient examined, no change in status, stable for surgery.  I have reviewed the patient's chart and labs.  Questions were answered to the patient's satisfaction.     Terrance Mass

## 2016-04-13 NOTE — Discharge Instructions (Addendum)
DISCHARGE INSTRUCTIONS: Laparoscopy  The following instructions have been prepared to help you care for yourself upon your return home today.  Wound care:  Do not get the incision wet for the first 24 hours. The incision should be kept clean and dry.  The Band-Aids or dressings may be removed the day after surgery.  Should the incision become sore, red, and swollen after the first week, check with your doctor.  Personal hygiene:  Shower the day after your procedure.  Activity and limitations:  Do NOT drive or operate any equipment today.  Do NOT lift anything more than 15 pounds for 2-3 weeks after surgery.  Do NOT rest in bed all day.  Walking is encouraged. Walk each day, starting slowly with 5-minute walks 3 or 4 times a day. Slowly increase the length of your walks.  Walk up and down stairs slowly.  Do NOT do strenuous activities, such as golfing, playing tennis, bowling, running, biking, weight lifting, gardening, mowing, or vacuuming for 2-4 weeks. Ask your doctor when it is okay to start.  Diet: Eat a light meal as desired this evening. You may resume your usual diet tomorrow.  Return to work: This is dependent on the type of work you do. For the most part you can return to a desk job within a week of surgery. If you are more active at work, please discuss this with your doctor.  What to expect after your surgery: You may have a slight burning sensation when you urinate on the first day. You may have a very small amount of blood in the urine. Expect to have a small amount of vaginal discharge/light bleeding for 1-2 weeks. It is not unusual to have abdominal soreness and bruising for up to 2 weeks. You may be tired and need more rest for about 1 week. You may experience shoulder pain for 24-72 hours. Lying flat in bed may relieve it.  Call your doctor for any of the following:  Develop a fever of 100.4 or greater  Inability to urinate 6 hours after discharge from  hospital  Severe pain not relieved by pain medications  Persistent of heavy bleeding at incision site  Redness or swelling around incision site after a week  Increasing nausea or vomiting      Post Anesthesia Home Care Instructions  Activity: Get plenty of rest for the remainder of the day. A responsible adult should stay with you for 24 hours following the procedure.  For the next 24 hours, DO NOT: -Drive a car -Paediatric nurse -Drink alcoholic beverages -Take any medication unless instructed by your physician -Make any legal decisions or sign important papers.  Meals: Start with liquid foods such as gelatin or soup. Progress to regular foods as tolerated. Avoid greasy, spicy, heavy foods. If nausea and/or vomiting occur, drink only clear liquids until the nausea and/or vomiting subsides. Call your physician if vomiting continues.  Special Instructions/Symptoms: Your throat may feel dry or sore from the anesthesia or the breathing tube placed in your throat during surgery. If this causes discomfort, gargle with warm salt water. The discomfort should disappear within 24 hours.  If you had a scopolamine patch placed behind your ear for the management of post- operative nausea and/or vomiting:  1. The medication in the patch is effective for 72 hours, after which it should be removed.  Wrap patch in a tissue and discard in the trash. Wash hands thoroughly with soap and water. 2. You may remove the patch  earlier than 72 hours if you experience unpleasant side effects which may include dry mouth, dizziness or visual disturbances. 3. Avoid touching the patch. Wash your hands with soap and water after contact with the patch.    Post Anesthesia Home Care Instructions  Activity: Get plenty of rest for the remainder of the day. A responsible adult should stay with you for 24 hours following the procedure.  For the next 24 hours, DO NOT: -Drive a car -Paediatric nurse -Drink  alcoholic beverages -Take any medication unless instructed by your physician -Make any legal decisions or sign important papers.  Meals: Start with liquid foods such as gelatin or soup. Progress to regular foods as tolerated. Avoid greasy, spicy, heavy foods. If nausea and/or vomiting occur, drink only clear liquids until the nausea and/or vomiting subsides. Call your physician if vomiting continues.  Special Instructions/Symptoms: Your throat may feel dry or sore from the anesthesia or the breathing tube placed in your throat during surgery. If this causes discomfort, gargle with warm salt water. The discomfort should disappear within 24 hours.  If you had a scopolamine patch placed behind your ear for the management of post- operative nausea and/or vomiting:  1. The medication in the patch is effective for 72 hours, after which it should be removed.  Wrap patch in a tissue and discard in the trash. Wash hands thoroughly with soap and water. 2. You may remove the patch earlier than 72 hours if you experience unpleasant side effects which may include dry mouth, dizziness or visual disturbances. 3. Avoid touching the patch. Wash your hands with soap and water after contact with the patch.

## 2016-04-13 NOTE — Anesthesia Procedure Notes (Signed)
Procedure Name: Intubation Date/Time: 04/13/2016 7:24 AM Performed by: Hewitt Blade Pre-anesthesia Checklist: Patient identified, Emergency Drugs available, Suction available and Patient being monitored Patient Re-evaluated:Patient Re-evaluated prior to inductionOxygen Delivery Method: Circle system utilized Preoxygenation: Pre-oxygenation with 100% oxygen Intubation Type: IV induction Ventilation: Mask ventilation without difficulty Laryngoscope Size: Mac and 3 Grade View: Grade I Tube type: Oral Tube size: 7.0 mm Number of attempts: 1 Airway Equipment and Method: Stylet Placement Confirmation: ETT inserted through vocal cords under direct vision,  positive ETCO2 and breath sounds checked- equal and bilateral Secured at: 21 cm Tube secured with: Tape Dental Injury: Teeth and Oropharynx as per pre-operative assessment

## 2016-04-13 NOTE — Anesthesia Preprocedure Evaluation (Addendum)
Anesthesia Evaluation  Patient identified by MRN, date of birth, ID band Patient awake    Reviewed: Allergy & Precautions, NPO status , Patient's Chart, lab work & pertinent test results  Airway Mallampati: II       Dental no notable dental hx.    Pulmonary neg pulmonary ROS, former smoker,    Pulmonary exam normal        Cardiovascular Normal cardiovascular exam     Neuro/Psych negative neurological ROS  negative psych ROS   GI/Hepatic negative GI ROS, Neg liver ROS,   Endo/Other  negative endocrine ROS  Renal/GU negative Renal ROS  negative genitourinary   Musculoskeletal negative musculoskeletal ROS (+)   Abdominal (+) + obese,   Peds negative pediatric ROS (+)  Hematology negative hematology ROS (+)   Anesthesia Other Findings   Reproductive/Obstetrics negative OB ROS                             Anesthesia Physical Anesthesia Plan  ASA: II  Anesthesia Plan: General   Post-op Pain Management:    Induction: Intravenous  Airway Management Planned: Oral ETT  Additional Equipment:   Intra-op Plan:   Post-operative Plan:   Informed Consent:   Dental advisory given  Plan Discussed with: CRNA and Surgeon  Anesthesia Plan Comments:         Anesthesia Quick Evaluation

## 2016-04-13 NOTE — Anesthesia Postprocedure Evaluation (Signed)
Anesthesia Post Note  Patient: Nichole [name]  Procedure(s) Performed: Procedure(s) (LRB): LAPAROSCOPIC SALPINGO OOPHORECTOMY (Bilateral)  Patient location during evaluation: PACU Anesthesia Type: General Level of consciousness: sedated Pain management: pain level controlled Vital Signs Assessment: post-procedure vital signs reviewed and stable Respiratory status: spontaneous breathing Cardiovascular status: stable Postop Assessment: no signs of nausea or vomiting Anesthetic complications: no     Last Vitals:  Vitals:   04/13/16 1030 04/13/16 1045  BP: 130/70 113/70  Pulse: 90 88  Resp: 17 14  Temp: 36.6 C     Last Pain:  Vitals:   04/13/16 0630  TempSrc: Oral   Pain Goal: Patients Stated Pain Goal:  (Asleep) (04/13/16 0919)               [place]

## 2016-04-15 ENCOUNTER — Telehealth: Payer: Self-pay | Admitting: *Deleted

## 2016-04-15 ENCOUNTER — Encounter (HOSPITAL_COMMUNITY): Payer: Self-pay | Admitting: Gynecology

## 2016-04-15 NOTE — Telephone Encounter (Signed)
-----   Message from Terrance Mass, MD sent at 04/14/2016  1:52 PM EDT ----- Anderson Malta could you call patient to see how she is doing from her surgery yesterday. Her husband had called me last night that she was having some nausea and vomiting. See if the Zofran helped her

## 2016-04-15 NOTE — Telephone Encounter (Signed)
I left message for pt to call.

## 2016-04-19 ENCOUNTER — Telehealth: Payer: Self-pay

## 2016-04-19 NOTE — Telephone Encounter (Signed)
I called patient to notify her of normal path results from surgery. When asked how she was doing she responded she was fine but was having some bloating she feels like it is gas.  Otherwise no fever or pain since surgery. No UTI sx. Patient asked if there was anything you could recommend. I recommend Simethicone OTC to her but wanted to check with you as well.  Also, patient going back to gym tomorrow to do treadmill and wondered how long she could walk?

## 2016-04-19 NOTE — Telephone Encounter (Signed)
-----   Message from Nichole Mass, MD sent at 04/14/2016  5:46 PM EDT ----- Please inform patient her pathology report was benign

## 2016-04-19 NOTE — Telephone Encounter (Signed)
Simethicone over-the-counter is fine. She can walk 1-3 miles without no problem. No lifting for at least 3 weeks

## 2016-04-20 NOTE — Telephone Encounter (Signed)
Left detailed message with answers on her voice mail per [facility] access note.

## 2016-04-28 ENCOUNTER — Encounter: Payer: Self-pay | Admitting: Gynecology

## 2016-04-28 ENCOUNTER — Ambulatory Visit (INDEPENDENT_AMBULATORY_CARE_PROVIDER_SITE_OTHER): Payer: Federal, State, Local not specified - PPO | Admitting: Gynecology

## 2016-04-28 VITALS — BP 124/80

## 2016-04-28 DIAGNOSIS — Z09 Encounter for follow-up examination after completed treatment for conditions other than malignant neoplasm: Secondary | ICD-10-CM

## 2016-04-28 NOTE — Progress Notes (Signed)
   Patient is a 65 year old that presented to the office today for her two-week postop visit. On 04/13/2016 patient underwent laparoscopic bilateral salpingo-oophorectomy as a result of persistent left ovarian cyst and past history of breast cancer. She is doing well from her surgery is asymptomatic planning on going on a vacation trip [place] soon. Pathology report as well as pictures were shown to the patient.  Findings at surgery: Left ovary slightly larger than right ovary no excrescences and seen no peritoneal abnormalities. Right tube and ovary normal left tube normal normal-appearing appendix normal liver surface gallbladder not seen   Diagnosis 1. Ovary and fallopian tube, left - SEROMUCINOUS CYSTADENOFIBROMA. - NO BORDERLINE CHANGE OR EVIDENCE OF MALIGNANCY. - BENIGN FALLOPIAN TUBE WITH BENIGN PARATUBAL CYST AND SUBSEROSAL SUTURES CONSISTENT WITH PREVIOUS PROCEDURE. - NO ENDOMETRIOSIS OR MALIGNANCY. 2. Ovary and fallopian tube, right - BENIGN OVARY WITH ENDOSALPINGIOSIS. - NO ENDOMETRIOSIS OR MALIGNANCY. - BENIGN FALLOPIAN TUBE WITH BENIGN PARATUBAL CYST AND SUBSEROSAL SUTURE CONSISTENT WITH PREVIOUS PROCEDURE. - NO ENDOMETRIOSIS OR MALIGNANCY.  Exam: Blood pressure 124/80 Gen. appearance well-developed well-nourished female in no acute distress Abdomen: Soft nontender no rebound or guarding laparoscopic incisions ports completely healed Pelvic: Bartholin urethra Skene was within normal limits Vagina: No lesions or discharge Cervix: No lesions or discharge Uterus: Anteverted normal size shape and consistency nontender and mobile Adnexa: No palpable mass or tenderness Rectal exam: Not done  Assessment/plan: Patient status post laparoscopic bilateral salpingo-oophorectomy as a result of persistent left ovarian cyst and past history of breast cancer. Patient doing well may resume full normal activity pathology report was benign patient otherwise scheduled to return back in one  year or when necessary.

## 2016-05-10 ENCOUNTER — Encounter: Payer: Self-pay | Admitting: Oncology

## 2016-05-10 ENCOUNTER — Other Ambulatory Visit: Payer: Self-pay | Admitting: Oncology

## 2016-05-10 DIAGNOSIS — C50911 Malignant neoplasm of unspecified site of right female breast: Secondary | ICD-10-CM

## 2016-06-19 ENCOUNTER — Other Ambulatory Visit: Payer: Self-pay | Admitting: Oncology

## 2016-07-07 DIAGNOSIS — M65312 Trigger thumb, left thumb: Secondary | ICD-10-CM | POA: Diagnosis not present

## 2016-07-07 DIAGNOSIS — M65311 Trigger thumb, right thumb: Secondary | ICD-10-CM | POA: Diagnosis not present

## 2016-07-12 DIAGNOSIS — M65311 Trigger thumb, right thumb: Secondary | ICD-10-CM | POA: Diagnosis not present

## 2016-08-02 DIAGNOSIS — M25649 Stiffness of unspecified hand, not elsewhere classified: Secondary | ICD-10-CM | POA: Diagnosis not present

## 2016-08-09 DIAGNOSIS — M25649 Stiffness of unspecified hand, not elsewhere classified: Secondary | ICD-10-CM | POA: Diagnosis not present

## 2016-08-11 DIAGNOSIS — M25649 Stiffness of unspecified hand, not elsewhere classified: Secondary | ICD-10-CM | POA: Diagnosis not present

## 2016-08-16 DIAGNOSIS — M8589 Other specified disorders of bone density and structure, multiple sites: Secondary | ICD-10-CM | POA: Diagnosis not present

## 2016-08-16 DIAGNOSIS — Z780 Asymptomatic menopausal state: Secondary | ICD-10-CM | POA: Diagnosis not present

## 2016-08-17 ENCOUNTER — Other Ambulatory Visit: Payer: Self-pay | Admitting: Oncology

## 2016-08-17 DIAGNOSIS — Z853 Personal history of malignant neoplasm of breast: Secondary | ICD-10-CM

## 2016-08-19 ENCOUNTER — Other Ambulatory Visit: Payer: Self-pay | Admitting: *Deleted

## 2016-10-05 ENCOUNTER — Encounter: Payer: Self-pay | Admitting: Oncology

## 2016-10-07 ENCOUNTER — Encounter: Payer: Self-pay | Admitting: Oncology

## 2016-10-17 ENCOUNTER — Encounter: Payer: Self-pay | Admitting: Gynecology

## 2016-10-21 ENCOUNTER — Encounter: Payer: Self-pay | Admitting: *Deleted

## 2016-10-26 ENCOUNTER — Encounter: Payer: Self-pay | Admitting: Oncology

## 2016-10-26 ENCOUNTER — Ambulatory Visit
Admission: RE | Admit: 2016-10-26 | Discharge: 2016-10-26 | Disposition: A | Discharge status: Home / Self Care | Payer: Federal, State, Local not specified - PPO | Source: Ambulatory Visit | Attending: Oncology | Admitting: Oncology

## 2016-10-26 DIAGNOSIS — R928 Other abnormal and inconclusive findings on diagnostic imaging of breast: Secondary | ICD-10-CM | POA: Diagnosis not present

## 2016-10-26 DIAGNOSIS — Z853 Personal history of malignant neoplasm of breast: Secondary | ICD-10-CM

## 2016-10-29 ENCOUNTER — Telehealth: Payer: Self-pay | Admitting: Oncology

## 2016-10-29 NOTE — Telephone Encounter (Signed)
lvm to inform pt of 3/5 appts at 8 am per LOS

## 2016-11-12 ENCOUNTER — Other Ambulatory Visit: Payer: Self-pay | Admitting: Adult Health

## 2016-11-12 DIAGNOSIS — C50211 Malignant neoplasm of upper-inner quadrant of right female breast: Secondary | ICD-10-CM

## 2016-11-12 DIAGNOSIS — Z170 Estrogen receptor positive status [ER+]: Secondary | ICD-10-CM

## 2016-11-15 ENCOUNTER — Other Ambulatory Visit: Payer: Federal, State, Local not specified - PPO

## 2016-11-15 ENCOUNTER — Encounter: Payer: Self-pay | Admitting: Oncology

## 2016-11-15 ENCOUNTER — Ambulatory Visit (HOSPITAL_COMMUNITY)
Admission: RE | Admit: 2016-11-15 | Discharge: 2016-11-15 | Disposition: A | Discharge status: Home / Self Care | Payer: Federal, State, Local not specified - PPO | Source: Ambulatory Visit | Attending: Oncology | Admitting: Oncology

## 2016-11-15 ENCOUNTER — Ambulatory Visit (HOSPITAL_BASED_OUTPATIENT_CLINIC_OR_DEPARTMENT_OTHER): Payer: Federal, State, Local not specified - PPO | Admitting: Oncology

## 2016-11-15 ENCOUNTER — Telehealth: Payer: Self-pay | Admitting: Internal Medicine

## 2016-11-15 ENCOUNTER — Other Ambulatory Visit (HOSPITAL_BASED_OUTPATIENT_CLINIC_OR_DEPARTMENT_OTHER): Payer: Federal, State, Local not specified - PPO

## 2016-11-15 DIAGNOSIS — Z170 Estrogen receptor positive status [ER+]: Secondary | ICD-10-CM | POA: Insufficient documentation

## 2016-11-15 DIAGNOSIS — C50811 Malignant neoplasm of overlapping sites of right female breast: Secondary | ICD-10-CM | POA: Insufficient documentation

## 2016-11-15 DIAGNOSIS — Z79811 Long term (current) use of aromatase inhibitors: Secondary | ICD-10-CM

## 2016-11-15 DIAGNOSIS — R0781 Pleurodynia: Secondary | ICD-10-CM | POA: Diagnosis not present

## 2016-11-15 DIAGNOSIS — Z853 Personal history of malignant neoplasm of breast: Secondary | ICD-10-CM

## 2016-11-15 DIAGNOSIS — C50211 Malignant neoplasm of upper-inner quadrant of right female breast: Secondary | ICD-10-CM

## 2016-11-15 LAB — COMPREHENSIVE METABOLIC PANEL
ALT: 39 U/L (ref 0–55)
AST: 30 U/L (ref 5–34)
Albumin: 3.8 g/dL (ref 3.5–5.0)
Alkaline Phosphatase: 95 U/L (ref 40–150)
Anion Gap: 9 mEq/L (ref 3–11)
BUN: 12.3 mg/dL (ref 7.0–26.0)
CO2: 27 meq/L (ref 22–29)
Calcium: 9.6 mg/dL (ref 8.4–10.4)
Chloride: 108 mEq/L (ref 98–109)
Creatinine: 0.8 mg/dL (ref 0.6–1.1)
EGFR: 82 mL/min/{1.73_m2} — AB (ref >90)
GLUCOSE: 91 mg/dL (ref 70–140)
Potassium: 4.1 mEq/L (ref 3.5–5.1)
SODIUM: 144 meq/L (ref 136–145)
TOTAL PROTEIN: 6.9 g/dL (ref 6.4–8.3)
Total Bilirubin: 0.66 mg/dL (ref 0.20–1.20)

## 2016-11-15 LAB — CBC WITH DIFFERENTIAL/PLATELET
BASO%: 0.4 % (ref 0.0–2.0)
BASOS ABS: 0 10*3/uL (ref 0.0–0.1)
EOS ABS: 0.2 10*3/uL (ref 0.0–0.5)
EOS%: 2.9 % (ref 0.0–7.0)
HCT: 42.9 % (ref 34.8–46.6)
HEMOGLOBIN: 14.6 g/dL (ref 11.6–15.9)
LYMPH#: 3.6 10*3/uL — AB (ref 0.9–3.3)
LYMPH%: 45.9 % (ref 14.0–49.7)
MCH: 30.3 pg (ref 25.1–34.0)
MCHC: 33.9 g/dL (ref 31.5–36.0)
MCV: 89.3 fL (ref 79.5–101.0)
MONO#: 0.5 10*3/uL (ref 0.1–0.9)
MONO%: 6 % (ref 0.0–14.0)
NEUT#: 3.5 10*3/uL (ref 1.5–6.5)
NEUT%: 44.8 % (ref 38.4–76.8)
PLATELETS: 223 10*3/uL (ref 145–400)
RBC: 4.81 10*6/uL (ref 3.70–5.45)
RDW: 13.3 % (ref 11.2–14.5)
WBC: 7.8 10*3/uL (ref 3.9–10.3)

## 2016-11-15 MED ORDER — ANASTROZOLE 1 MG PO TABS: 1.0000 mg | ORAL_TABLET | Freq: Every day | ORAL | 3 refills | Status: DC

## 2016-11-15 MED ORDER — ROSUVASTATIN CALCIUM 5 MG PO TABS: 5.0000 mg | ORAL_TABLET | Freq: Every day | ORAL | 1 refills | Status: DC

## 2016-11-15 NOTE — Telephone Encounter (Signed)
Referred by Dr. Jana Hakim, needs a PCP. Please contact the patient via phone or letter, recommend OV to get establish at her convenience

## 2016-11-15 NOTE — Progress Notes (Signed)
ID: Nichole [name]   DOB: 12-17-1950  MR#: [id number]  WNI#:[id number]  PCP: No primary care provider on file. GYN: Dr Uvaldo Rising SU:  OTHER MD: Dr. Carol Ada  CHIEF COMPLAINT:  Right Breast Cancer  CURRENT TREATMENT: Anastrozole  HISTORY OF PRESENT ILLNESS: From the original intake note:  The patient herself noted a mass in her right breast and brought it to her physician's attention. She was living in [place] at that time. An ultrasound was done, showing a 1.7 cm spiculated nodule in the right breast with associated calcifications. There were no other findings of concern.  Lumpectomy was performed, with reportedly negative margins (I do not have those records). In Dr. Rosendo Gros note 11/04/2011 this tumor is described as triple positive.  The patient moved to [facility] for further treatment, and sentinel lymph node sampling in December of 2012 was negative. The original tissue obtained in [place] was reviewed, showing a 1.6 cm, grade 3 invasive ductal carcinoma. and the prognostic panel obtained in [place] showed the tumor to be estrogen receptor positive, progesterone receptor negative, and HER-2 negative in the invasive tumor (it was positive in the in situ tumor; SCT 12-30 at [facility]). (A note from that report states that HER-2 by [facility] was performed at [facility] and was reportedly positive in the invasive carcinoma. That report is not available to me today.)  The patient completed adjuvant radiation and was reassessed with an [facility] panel, which showed the tumor to be HER-2 negative, with an MIB-1 of 42%, again estrogen receptor positive and progesterone receptor negative. This panel predicted a 43% risk of recurrence at 8 years. The patient also had an Oncotype DX sent. This was again ER positive, PR negative, and HER-2 negative, and predicted a 34% risk of distant recurrence at 10 years if the patient's only adjuvant systemic treatment was  tamoxifen for 5 years. With these numbers Dr. Curlene Labrum discussed adjuvant chemotherapy with the patient, and proposed carboplatin, docetaxel, and Herceptin, which was started on February 2013. On a note from 11/12/2011, Dr. Curlene Labrum states "even though the HER-2-neu is negative, I am still opting for Herceptin based on the aggressiveness of the tumor." The chemotherapy was completed in June 2013. Herceptin was continued, the plan being to complete one year.   The patient's subsequent history is as detailed below.  INTERVAL HISTORY: Nichole [name] returns today for follow-up of her estrogen receptor positive breast cancer. She continues on anastrozole, with good tolerance.Hot flashes and vaginal dryness are not a major issue. She never developed the arthralgias or myalgias that many patients can experience on this medication. She obtains it at a good price.   REVIEW OF SYSTEMS  Nichole [name] spends a great deal of time traveling between here, [place], and [place]. As a result she is not exercising. In addition there are some family issues which were discussed today. They cause her a significant amount of stress. Aside from that she has slight discomfort in the surgical breast. Also in the right lateral rib cage area she has some tenderness which is positional but focal. A detailed review of systems today was noncontributory except as noted   PAST MEDICAL HISTORY: Past Medical History:  Diagnosis Date  . Breast cancer ([place])   . Cancer [facility]) december 2012   right breast  . History of breast cancer 07/2011   right  . History of chemotherapy 11/2011  . Hyperlipidemia    no current med.  . SVD (spontaneous vaginal delivery)  x 3    PAST SURGICAL HISTORY: Past Surgical History:  Procedure Laterality Date  . BREAST LUMPECTOMY Right 08/2011  . BREAST LUMPECTOMY Right   . CARPAL TUNNEL RELEASE Left 06/19/2013   Procedure: CARPAL TUNNEL RELEASE;  Surgeon: Cammie Sickle., MD;  Location: [facility];   Service: Orthopedics;  Laterality: Left;  . CESAREAN SECTION     x 1  . COLON SURGERY     Hx polyps  . LAPAROSCOPIC SALPINGO OOPHERECTOMY Bilateral 04/13/2016   Procedure: LAPAROSCOPIC SALPINGO OOPHORECTOMY;  Surgeon: Terrance Mass, MD;  Location: [facility];  Service: Gynecology;  Laterality: Bilateral;  . PORT-A-CATH REMOVAL  06/2012  . PORTACATH PLACEMENT  2013  . SENTINEL LYMPH NODE BIOPSY Right 08/2011  . WISDOM TOOTH EXTRACTION      FAMILY HISTORY Family History  Problem Relation Age of Onset  . Colon cancer Mother     dx postpartum  . Congestive Heart Failure Mother   . Alzheimer's disease Mother   . Diabetes Father   . Breast cancer Maternal Aunt   . Breast cancer Cousin 25  . Diabetes Paternal Grandmother   . Breast cancer Maternal Aunt    The patient's father died at the age of 81. The patient's mother died at the age of 80 after a fall, in the setting of Alzheimer's disease. The patient has no siblings. There are some second-degree relatives with breast cancer. She has been tested for the BRCA 1 and 2 mutations, and was found to be negative.  GYNECOLOGIC HISTORY: Menarche age 89. First live birth age 54. She is GX P4. Last menstrual period 2006. Status post hormone replacement, discontinued November 2012.  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 Her husband, [name], recently retired from the [employer]. This is a second marriage for both of them. Pat's 4 children are [name], 41, who lives in [place] and works as a Cabin crew; [name], who works as a Radio broadcast assistant for an Engineer, production firm in [place]; [name], who works as an Chief Financial Officer in [place]; and the youngest, [name], 28, who works as a Scientist, clinical (histocompatibility and immunogenetics) in [place]. The patient has 4 grandchildren and one on the way. She is a Nurse, learning disability.  ADVANCED DIRECTIVES: not in  place  HEALTH MAINTENANCE:  (Updated November 2014) Social History  Substance Use Topics  . Smoking status: Former Smoker    Years: 3.00    Types: Cigarettes    Quit date: 09/13/2001  . Smokeless tobacco: Never Used  . Alcohol use No     Colonoscopy: Oct 2013/Dr. Hung (Repeat in 2018)  PAP:  Oct 2014, Dr. Toney Rakes  Bone density:  Lipid panel:  Nov 2014, elevated    Allergies  Allergen Reactions  . Penicillins Rash    Has patient had a PCN reaction causing immediate rash, facial/tongue/throat swelling, SOB or lightheadedness with hypotension: No Has patient had a PCN reaction causing severe rash involving mucus membranes or skin necrosis: No Has patient had a PCN reaction that required hospitalization No Has patient had a PCN reaction occurring within the last 10 years: Yes If all of the above answers are "NO", then may proceed with Cephalosporin use.   . Pork-Derived Products Rash    Current Outpatient Prescriptions  Medication Sig Dispense Refill  . anastrozole (ARIMIDEX) 1 MG tablet Take 1 tablet (1 mg total) by mouth daily. 90 tablet 3  .  Calcium Carbonate (CALTRATE 600 PO) Take 1 tablet by mouth daily.    . Cholecalciferol (VITAMIN D PO) Take 1 tablet by mouth daily.    . rosuvastatin (CRESTOR) 5 MG tablet Take 1 tablet (5 mg total) by mouth daily. 90 tablet 1   No current facility-administered medications for this visit.     OBJECTIVE: Middle-aged white woman who Appears stated age 66:   11/15/16 0825  BP: 112/60  Pulse: 69  Resp: 18  Temp: 97.9 F (36.6 C)     Body mass index is 36.81 kg/m.    ECOG FS: 0 Filed Weights   11/15/16 0825  Weight: 173 lb 1.6 oz (78.5 kg)   Sclerae unicteric, pupils round and equal Oropharynx clear and moist-- no thrush or other lesions No cervical or supraclavicular adenopathy Lungs no rales or rhonchi Heart regular rate and rhythm Abd soft, obese, nontender, positive bowel sounds MSK no focal spinal tenderness, no  upper extremity lymphedema; in the right lateral rib cage slightly posteriorly at about the level of the seventh or eighth rib there is an area of focal tenderness to palpation Neuro: nonfocal, well oriented, appropriate affect Breasts: The right breast is status post lumpectomy and radiation. There is mild distortion of the contour but generally the cosmetic result is good. There is no evidence of local recurrence. The left breast is unremarkable. Both axillae are benign.  LAB RESULTS: Lab Results  Component Value Date   WBC 7.8 11/15/2016   NEUTROABS 3.5 11/15/2016   HGB 14.6 11/15/2016   HCT 42.9 11/15/2016   MCV 89.3 11/15/2016   PLT 223 11/15/2016      Chemistry      Component Value Date/Time   NA 142 11/03/2015 1136   K 3.9 11/03/2015 1136   CL 103 05/24/2014 1650   CL 110 (H) 01/08/2013 0821   CO2 25 11/03/2015 1136   BUN 12.7 11/03/2015 1136   CREATININE 0.8 11/03/2015 1136      Component Value Date/Time   CALCIUM 9.4 11/03/2015 1136   ALKPHOS 82 11/03/2015 1136   AST 34 11/03/2015 1136   ALT 41 11/03/2015 1136   BILITOT 0.70 11/03/2015 1136     Lipid Panel     Component Value Date/Time   CHOL 162 08/27/2015 1141   TRIG 122 08/27/2015 1141   HDL 56 08/27/2015 1141   CHOLHDL 2.9 08/27/2015 1141   VLDL 24 08/27/2015 1141   LDLCALC 82 08/27/2015 1141      STUDIES: Mm Diag Breast Tomo Bilateral  Result Date: 10/26/2016 CLINICAL DATA:  Patient status post right breast lumpectomy. For follow-up evaluation. EXAM: 2D DIGITAL DIAGNOSTIC BILATERAL MAMMOGRAM WITH CAD AND ADJUNCT TOMO COMPARISON:  Previous exam(s). ACR Breast Density Category b: There are scattered areas of fibroglandular density. FINDINGS: Stable postlumpectomy changes right breast. No concerning masses, calcifications or architectural distortion identified within either breast. Mammographic images were processed with CAD. IMPRESSION: No mammographic evidence for malignancy. RECOMMENDATION: Bilateral  diagnostic mammography 1 year. I have discussed the findings and recommendations with the patient. Results were also provided in writing at the conclusion of the visit. If applicable, a reminder letter will be sent to the patient regarding the next appointment. BI-RADS CATEGORY  2: Benign. Electronically Signed   By: Lovey Newcomer M.D.   On: 10/26/2016 15:19    ASSESSMENT: 66 y.o. BRCA negative [place] woman recently moved to [facility], s/p Right lumpectomy December 2012 for a pT1c pN0, stage 1A invasive ductal carcinoma, grade 3, estrogen receptor  positive, progesterone receptor negative, with a Ki-67 of 42%  (1) Oncotype DX recurrence score 54% predicting a 10-year risk of distant recurrence of 34% if the only adjuvant systemic treatment is tamoxifen x 5 years  (1) s/p adjuvant radiation completed February 2013  (2) s/p six cycles of carboplatin/ docetaxel/ trastuzumab completed June 2012  (3) started tamoxifen 05/14/2012, discontinued September 2015  (4) started anastrozole 08/13/2014  (a) bone density obtained in a [facility]  08/06/2014 shows a T score of -1.8  (5)  Status post endometrial biopsy in October 2014 for a thickened endometrial lining, pathology benign   (a) repeat transvaginal ultrasound 08/06/2015 found an endometrial stripe measuring 5.5 cm.  PLAN:  Nichole [name] is now a little over 5 years out from definitive surgery for her breast cancer with no evidence of disease recurrence. This is very favorable.  She is tolerating the anastrozole well, aside from hot flashes. The plan is to continue this through the end of this year, at which point she will be ready to "graduate" from follow-up here.  She does not have a primary care physician but really needs one. I am referring her to Dr. [name] without in mind. If he is able to take her on his service then I will release her to his care when she returns to see me in October of this year.  I think the rib cage  discomfort is going to a related to her earlier right-sided breast surgery and radiation, but we are obtaining plain films of her right ribs just in case. I will send her a copy of that report  I strongly encouraged her to exercise regularly whether or not she is traveling.  She knows to call for any problems that may develop before the next visit here.  Chauncey Cruel, MD     11/15/2016

## 2016-11-16 NOTE — Telephone Encounter (Signed)
thx

## 2016-11-16 NOTE — Telephone Encounter (Signed)
Called pt. Left detailed vm to call our office back to schedule a new patient appt.

## 2016-11-19 ENCOUNTER — Encounter: Payer: Self-pay | Admitting: Internal Medicine

## 2016-11-19 ENCOUNTER — Ambulatory Visit (INDEPENDENT_AMBULATORY_CARE_PROVIDER_SITE_OTHER): Payer: Federal, State, Local not specified - PPO | Admitting: Internal Medicine

## 2016-11-19 VITALS — BP 118/68 | HR 92 | Temp 97.8°F | Resp 14 | Ht <= 58 in | Wt 174.0 lb

## 2016-11-19 DIAGNOSIS — Z0000 Encounter for general adult medical examination without abnormal findings: Secondary | ICD-10-CM | POA: Diagnosis not present

## 2016-11-19 DIAGNOSIS — Z1159 Encounter for screening for other viral diseases: Secondary | ICD-10-CM

## 2016-11-19 DIAGNOSIS — Z23 Encounter for immunization: Secondary | ICD-10-CM | POA: Diagnosis not present

## 2016-11-19 NOTE — Progress Notes (Signed)
Pre visit review using our clinic review tool, if applicable. No additional management support is needed unless otherwise documented below in the visit note. 

## 2016-11-19 NOTE — Assessment & Plan Note (Addendum)
Td 2014; does not like to get flu shot , Prevnar today --Gyn Dr Toney Rakes --H/o breast CA- last MMG 10-2016 neg --[facility]: cscope 06-2012, Dr Elbert Ewings, no polyps , 5 years --will get a FLP, TSH and a hep C --Diet and exercise discussed

## 2016-11-19 NOTE — Patient Instructions (Signed)
  GO TO THE FRONT DESK Schedule your next appointment for a  physical exam in one year  Schedule labs to be done next week.  Plan plan to check your cholesterol again in 6 months  For your throat problem, take omeprazole 20 mg  OTC 1 tablet before breakfast for the next 6 weeks, if  the throat discomfort is  not better please let me know for a ENT referral

## 2016-11-19 NOTE — Progress Notes (Signed)
Subjective:    Patient ID: Nichole [name], female    DOB: 08/19/1951, 66 y.o.   MRN: [id number]  DOS:  11/19/2016 Type of visit - description : new pt, CPX Interval history: Patient has a history of breast cancer, chart reviewed. Was taking Crestor up to a week ago, based on some article she read, decided to stop, "too many side effects". Also, few months ago developed a problem in the throat, like something was in there and continued to clear the throat frequently. Went to see an allergist, was treated, now is better, still has some mild persistent discomfort. Denies GERD symptoms.   Review of Systems  Other than above, a 14 point review of systems is negative    Past Medical History:  Diagnosis Date  . Breast cancer ([place])   . Cancer [facility]) december 2012   right breast  . History of breast cancer 07/2011   right  . History of chemotherapy 11/2011  . Hyperlipidemia    no current med.  . SVD (spontaneous vaginal delivery)    x 3    Past Surgical History:  Procedure Laterality Date  . BREAST LUMPECTOMY Right 08/2011  . BREAST LUMPECTOMY Right   . CARPAL TUNNEL RELEASE Left 06/19/2013   Procedure: CARPAL TUNNEL RELEASE;  Surgeon: Cammie Sickle., MD;  Location: [facility];  Service: Orthopedics;  Laterality: Left;  . CESAREAN SECTION     x 1  . COLON SURGERY     Hx polyps  . LAPAROSCOPIC SALPINGO OOPHERECTOMY Bilateral 04/13/2016   Procedure: LAPAROSCOPIC SALPINGO OOPHORECTOMY;  Surgeon: Terrance Mass, MD;  Location: [facility];  Service: Gynecology;  Laterality: Bilateral;  . PORT-A-CATH REMOVAL  06/2012  . PORTACATH PLACEMENT  2013  . SENTINEL LYMPH NODE BIOPSY Right 08/2011  . WISDOM TOOTH EXTRACTION      Social History   Social History  . Marital status: Married    Spouse name: N/A  . Number of children: 4  . Years of education: N/A   Occupational History  . retired from the [employer] Topics  . Smoking status:  Former Smoker    Years: 3.00    Types: Cigarettes    Quit date: 09/13/2001  . Smokeless tobacco: Never Used     Comment: social-light smoker  . Alcohol use No  . Drug use: No  . Sexual activity: Yes    Birth control/ protection: Post-menopausal   Other Topics Concern  . Not on file   Social History Narrative   Original from [place]     Family History  Problem Relation Age of Onset  . Colon cancer Mother     dx postpartum  . Congestive Heart Failure Mother   . Alzheimer's disease Mother   . Diabetes Father   . Breast cancer Maternal Aunt   . Breast cancer Cousin 66  . Diabetes Paternal Grandmother   . Breast cancer Maternal Aunt   . AAA (abdominal aortic aneurysm) Neg Hx      Allergies as of 11/19/2016      Reactions   Penicillins Rash   Has patient had a PCN reaction causing immediate rash, facial/tongue/throat swelling, SOB or lightheadedness with hypotension: No Has patient had a PCN reaction causing severe rash involving mucus membranes or skin necrosis: No Has patient had a PCN reaction that required hospitalization No Has patient had a PCN reaction occurring within the last 10 years: Yes If all of the  above answers are "NO", then may proceed with Cephalosporin use.   Poractant Alfa Rash   Pork-derived Products Rash      Medication List       Accurate as of 11/19/16 11:59 PM. Always use your most recent med list.          anastrozole 1 MG tablet Commonly known as:  ARIMIDEX Take 1 tablet (1 mg total) by mouth daily.   CALTRATE 600 PO Take 1 tablet by mouth daily.   omeprazole 20 MG tablet Commonly known as:  PRILOSEC OTC Take 20 mg by mouth daily.   VITAMIN C PO Take by mouth.   VITAMIN D PO Take 1 tablet by mouth daily.          Objective:   Physical Exam  Abdominal:     BP 118/68 (BP Location: Left Arm, Patient Position: Sitting, Cuff Size: Normal)   Pulse 92   Temp 97.8 F (36.6 C) (Oral)   Resp 14   Ht 4\' 10"  (1.473 m)   Wt 174 lb  (78.9 kg)   SpO2 98%   BMI 36.37 kg/m   General:   Well developed, well nourished . NAD.  Neck: No  thyromegaly . No mass, LAD, asymmetry noted. HEENT:  Normocephalic . Face symmetric, atraumatic throat symmetric, normal tonsils, oral membranes normal. Lungs:  CTA B Normal respiratory effort, no intercostal retractions, no accessory muscle use. Heart: RRR,  no murmur.  No pretibial edema bilaterally  Abdomen:  Not distended, soft, non-tender. No rebound or rigidity.   Skin: Exposed areas without rash. Not pale. Not jaundice Neurologic:  alert & oriented X3.  Speech normal, gait appropriate for age and unassisted Strength symmetric and appropriate for age.  Psych: Cognition and judgment appear intact.  Cooperative with normal attention span and concentration.  Behavior appropriate. No anxious or depressed appearing.    Assessment & Plan:   Assessment Hyperlipidemia Breast cancer R, 2012, lumpectomy, chemotherapy, s/p Port-A-Cath Lipoma, left abdomen.  Plan: Hyperlipidemia: Years ago took Lipitor and self d/c (not due to s/e  apparently). LDL 07-2015 was 210, was rx  Crestor, follow-up LDL was 82. Self d/c Crestor 11-2016 due to potential for s/e. We agreed we will a cholesterol panel this week and recheck in 6 months to see her new baseline.  Throat discomfort : See HPI, better after allergies were treated, she has some residual sxs. Recommend OTC omeprazole consistently for 4-6 weeks, if not back to normal recommend to call for an ENT referral RTC one year

## 2016-11-21 DIAGNOSIS — Z09 Encounter for follow-up examination after completed treatment for conditions other than malignant neoplasm: Secondary | ICD-10-CM | POA: Insufficient documentation

## 2016-11-21 NOTE — Assessment & Plan Note (Signed)
Hyperlipidemia: Years ago took Lipitor and self d/c (not due to s/e  apparently). LDL 07-2015 was 210, was rx  Crestor, follow-up LDL was 82. Self d/c Crestor 11-2016 due to potential for s/e. We agreed we will a cholesterol panel this week and recheck in 6 months to see her new baseline.  Throat discomfort : See HPI, better after allergies were treated, she has some residual sxs. Recommend OTC omeprazole consistently for 4-6 weeks, if not back to normal recommend to call for an ENT referral RTC one year

## 2016-11-22 ENCOUNTER — Ambulatory Visit: Payer: Federal, State, Local not specified - PPO | Admitting: Oncology

## 2016-11-22 ENCOUNTER — Other Ambulatory Visit (INDEPENDENT_AMBULATORY_CARE_PROVIDER_SITE_OTHER): Payer: Federal, State, Local not specified - PPO

## 2016-11-22 ENCOUNTER — Encounter: Payer: Self-pay | Admitting: Gynecology

## 2016-11-22 DIAGNOSIS — Z1159 Encounter for screening for other viral diseases: Secondary | ICD-10-CM | POA: Diagnosis not present

## 2016-11-22 DIAGNOSIS — Z0000 Encounter for general adult medical examination without abnormal findings: Secondary | ICD-10-CM

## 2016-11-22 LAB — LIPID PANEL
CHOLESTEROL: 254 mg/dL — AB (ref 0–200)
HDL: 49.3 mg/dL (ref >39.00)
LDL CALC: 167 mg/dL — AB (ref 0–99)
NonHDL: 204.58
TRIGLYCERIDES: 188 mg/dL — AB (ref 0.0–149.0)
Total CHOL/HDL Ratio: 5
VLDL: 37.6 mg/dL (ref 0.0–40.0)

## 2016-11-22 LAB — HEPATITIS C ANTIBODY: HCV Ab: NEGATIVE

## 2016-11-23 LAB — TSH: TSH: 3.33 u[IU]/mL (ref 0.35–4.50)

## 2017-01-18 ENCOUNTER — Other Ambulatory Visit: Payer: Self-pay

## 2017-01-26 ENCOUNTER — Encounter: Payer: Self-pay | Admitting: Gynecology

## 2017-04-20 DIAGNOSIS — Z1211 Encounter for screening for malignant neoplasm of colon: Secondary | ICD-10-CM | POA: Diagnosis not present

## 2017-04-23 ENCOUNTER — Encounter: Payer: Self-pay | Admitting: Oncology

## 2017-05-02 ENCOUNTER — Telehealth: Payer: Self-pay

## 2017-05-02 NOTE — Telephone Encounter (Signed)
Lvm with pt in response to her email wanting a recommendation for a gynecologist to replace Dr Toney Rakes.  Per Dr Jana Hakim recommendation for Dr Dellis Filbert at same practice

## 2017-05-27 ENCOUNTER — Ambulatory Visit: Payer: Federal, State, Local not specified - PPO | Admitting: Internal Medicine

## 2017-06-06 ENCOUNTER — Ambulatory Visit (INDEPENDENT_AMBULATORY_CARE_PROVIDER_SITE_OTHER): Payer: Federal, State, Local not specified - PPO | Admitting: Internal Medicine

## 2017-06-06 ENCOUNTER — Encounter: Payer: Self-pay | Admitting: Internal Medicine

## 2017-06-06 VITALS — BP 126/74 | HR 67 | Temp 98.0°F | Resp 14 | Ht <= 58 in | Wt 175.0 lb

## 2017-06-06 DIAGNOSIS — R2231 Localized swelling, mass and lump, right upper limb: Secondary | ICD-10-CM

## 2017-06-06 DIAGNOSIS — Z09 Encounter for follow-up examination after completed treatment for conditions other than malignant neoplasm: Secondary | ICD-10-CM | POA: Diagnosis not present

## 2017-06-06 DIAGNOSIS — G479 Sleep disorder, unspecified: Secondary | ICD-10-CM | POA: Diagnosis not present

## 2017-06-06 DIAGNOSIS — E785 Hyperlipidemia, unspecified: Secondary | ICD-10-CM | POA: Diagnosis not present

## 2017-06-06 NOTE — Progress Notes (Signed)
Pre visit review using our clinic review tool, if applicable. No additional management support is needed unless otherwise documented below in the visit note. 

## 2017-06-06 NOTE — Patient Instructions (Signed)
GO TO THE LAB : Get the blood work    GO TO THE FRONT DESK Schedule labs to be done within the next week, fasting  Schedule your next appointment for a  checkup in 6 months  Melatonin daily at bedtime  HEALTHY SLEEP Sleep hygiene: Basic rules for a good night's sleep  Sleep only as much as you need to feel rested and then get out of bed  Keep a regular sleep schedule  Avoid forcing sleep  Exercise regularly for at least 20 minutes, preferably 4 to 5 hours before bedtime  Avoid caffeinated beverages after lunch  Avoid alcohol near bedtime: no "night cap"  Avoid smoking, especially in the evening  Do not go to bed hungry  Adjust bedroom environment  Avoid prolonged use of light-emitting screens before bedtime   Deal with your worries before bedtime    \

## 2017-06-06 NOTE — Progress Notes (Addendum)
Subjective:    Patient ID: Nichole [name], female    DOB: 31-Aug-1951, 66 y.o.   MRN: [id number]  DOS:  06/06/2017 Type of visit - description : f/u Interval history: High cholesterol: After we check her labs and the LDL was elevated, she decided to go back on Crestor 5 mg daily. Also, has several other concerns: Has noted subcutaneous masses throughout the upper and lower extremities. Few of them are painful, this is happening over the last year. Sleep pattern is disturbed, mostly because aches and pains and hot flashes, she thinks due to anastrozole Has noted she is "shrinking".  Wt Readings from Last 3 Encounters:  06/06/17 175 lb (79.4 kg)  11/19/16 174 lb (78.9 kg)  11/15/16 173 lb 1.6 oz (78.5 kg)     Review of Systems No anxiety or depression After she went back on Crestor, did not notice any increase in her chronic aches and pains except maybe at the right  elbow  Past Medical History:  Diagnosis Date  . Breast cancer ([place])   . Cancer [facility]) december 2012   right breast  . History of breast cancer 07/2011   right  . History of chemotherapy 11/2011  . Hyperlipidemia    no current med.  . SVD (spontaneous vaginal delivery)    x 3    Past Surgical History:  Procedure Laterality Date  . BREAST LUMPECTOMY Right 08/2011  . BREAST LUMPECTOMY Right   . CARPAL TUNNEL RELEASE Left 06/19/2013   Procedure: CARPAL TUNNEL RELEASE;  Surgeon: Cammie Sickle., MD;  Location: [place];  Service: Orthopedics;  Laterality: Left;  . CESAREAN SECTION     x 1  . COLON SURGERY     Hx polyps  . LAPAROSCOPIC SALPINGO OOPHERECTOMY Bilateral 04/13/2016   Procedure: LAPAROSCOPIC SALPINGO OOPHORECTOMY;  Surgeon: Terrance Mass, MD;  Location: [facility];  Service: Gynecology;  Laterality: Bilateral;  . PORT-A-CATH REMOVAL  06/2012  . PORTACATH PLACEMENT  2013  . SENTINEL LYMPH NODE BIOPSY Right 08/2011  . WISDOM TOOTH EXTRACTION      Social History   Social  History  . Marital status: Married    Spouse name: N/A  . Number of children: 4  . Years of education: N/A   Occupational History  . retired from the [employer]  . Smoking status: Former Smoker    Years: 3.00    Types: Cigarettes    Quit date: 09/13/2001  . Smokeless tobacco: Never Used     Comment: social-light smoker  . Alcohol use No  . Drug use: No  . Sexual activity: Yes    Birth control/ protection: Post-menopausal   Other Topics Concern  . Not on file   Social History Narrative   Original from [place]   Lives w/ husband, travels frequently      Allergies as of 06/06/2017      Reactions   Penicillins Rash   Has patient had a PCN reaction causing immediate rash, facial/tongue/throat swelling, SOB or lightheadedness with hypotension: No Has patient had a PCN reaction causing severe rash involving mucus membranes or skin necrosis: No Has patient had a PCN reaction that required hospitalization No Has patient had a PCN reaction occurring within the last 10 years: Yes If all of the above answers are "NO", then may proceed with Cephalosporin use.   Poractant Alfa Rash   Pork-derived Products Rash      Medication List  Accurate as of 06/06/17 11:59 PM. Always use your most recent med list.          anastrozole 1 MG tablet Commonly known as:  ARIMIDEX Take 1 tablet (1 mg total) by mouth daily.   CALTRATE 600 PO Take 1 tablet by mouth daily.   omeprazole 20 MG tablet Commonly known as:  PRILOSEC OTC Take 20 mg by mouth daily.   rosuvastatin 5 MG tablet Commonly known as:  CRESTOR Take 5 mg by mouth at bedtime.   VITAMIN C PO Take by mouth.   VITAMIN D PO Take 1 tablet by mouth daily.            Discharge Care Instructions        Start     Ordered   06/06/17 0000  Lipid panel     06/06/17 1505   06/06/17 0000  AST     06/06/17 1505   06/06/17 0000  ALT     06/06/17 1505   06/06/17 0000  Ambulatory  referral to General Surgery    Question:  What is the reason for referral?  Answer:  Other  Comment:  subQ mass on R arm, requesting excision   06/06/17 1505         Objective:   Physical Exam BP 126/74 (BP Location: Left Arm, Patient Position: Sitting, Cuff Size: Normal)   Pulse 67   Temp 98 F (36.7 C) (Oral)   Resp 14   Ht 4\' 10"  (1.473 m)   Wt 175 lb (79.4 kg)   SpO2 98%   BMI 36.58 kg/m  General:   Well developed, overweight appearing . NAD.  HEENT:  Normocephalic . Face symmetric, atraumatic Neck: No thyromegaly or supraclavicular mass. Lymphatic system: No lymphadenopathies of the axillary or groin areas Lungs:  CTA B Normal respiratory effort, no intercostal retractions, no accessory muscle use. Heart: RRR,  no murmur.  No pretibial edema bilaterally  Skin, SQ:  Skin is normal. She does have numerous SQ masses   from half to 2 cm in size at the upper extremities and proximal lower extremities. Not tender. Neurologic:  alert & oriented X3.  Speech normal, gait appropriate for age and unassisted Psych--  Cognition and judgment appear intact.  Cooperative with normal attention span and concentration.  Behavior appropriate. No anxious or depressed appearing.      Assessment & Plan:   Assessment  (New patient 11-2016) Hyperlipidemia Breast cancer R, 2012, lumpectomy, chemotherapy, s/p Port-A-Cath Lipoma, left abdomen.  Plan:  Hyperlipidemia: after her last LDL was elevated at 167, patient elected to go back on Crestor 5 mg daily. Will come back fasting for a AST, ALT, FLP. Subcutaneous mass: several SQ masses developed over last year. Refer to surgery for consideration of excision. Etiology unclear. Most noticeable one is at the right arm close to the elbow. Sleep pattern disturbance: Denies anxiety or depression; sleep disturbance likely multifactorial including the fact that she is having side effects from anastrozole and also she frequently flies to [place]  make it very hard to keep a schedule. Recommend melatonin and keep a schedule if possible. Patient is "shrinking". Will recommend a bone density test in few months (previously done at gynecology). RTC 6 months

## 2017-06-07 NOTE — Assessment & Plan Note (Addendum)
Hyperlipidemia: after her last LDL was elevated at 167, patient elected to go back on Crestor 5 mg daily. Will come back fasting for a AST, ALT, FLP. Subcutaneous mass: several SQ masses developed over last year. Refer to surgery for consideration of excision. Etiology unclear. Most noticeable one is at the right arm close to the elbow. Sleep pattern disturbance: Denies anxiety or depression; sleep disturbance likely multifactorial including the fact that she is having side effects from anastrozole and also she frequently flies to [place] make it very hard to keep a schedule. Recommend melatonin and keep a schedule if possible. Patient is "shrinking". Will recommend a bone density test in few months (previously done at gynecology). RTC 6 months

## 2017-06-13 ENCOUNTER — Other Ambulatory Visit (INDEPENDENT_AMBULATORY_CARE_PROVIDER_SITE_OTHER): Payer: Federal, State, Local not specified - PPO

## 2017-06-13 DIAGNOSIS — E785 Hyperlipidemia, unspecified: Secondary | ICD-10-CM | POA: Diagnosis not present

## 2017-06-13 LAB — LIPID PANEL
CHOL/HDL RATIO: 4
CHOLESTEROL: 190 mg/dL (ref 0–200)
HDL: 52.2 mg/dL (ref >39.00)
LDL Cholesterol: 98 mg/dL (ref 0–99)
NONHDL: 137.96
Triglycerides: 198 mg/dL — ABNORMAL HIGH (ref 0.0–149.0)
VLDL: 39.6 mg/dL (ref 0.0–40.0)

## 2017-06-13 LAB — AST: AST: 25 U/L (ref 0–37)

## 2017-06-13 LAB — ALT: ALT: 23 U/L (ref 0–35)

## 2017-06-20 ENCOUNTER — Other Ambulatory Visit: Payer: Self-pay | Admitting: *Deleted

## 2017-06-20 ENCOUNTER — Ambulatory Visit (HOSPITAL_BASED_OUTPATIENT_CLINIC_OR_DEPARTMENT_OTHER): Payer: Federal, State, Local not specified - PPO | Admitting: Oncology

## 2017-06-20 ENCOUNTER — Other Ambulatory Visit (HOSPITAL_BASED_OUTPATIENT_CLINIC_OR_DEPARTMENT_OTHER): Payer: Federal, State, Local not specified - PPO

## 2017-06-20 VITALS — BP 131/72 | HR 67 | Temp 98.2°F | Resp 20 | Ht <= 58 in | Wt 173.8 lb

## 2017-06-20 DIAGNOSIS — Z170 Estrogen receptor positive status [ER+]: Secondary | ICD-10-CM

## 2017-06-20 DIAGNOSIS — C50811 Malignant neoplasm of overlapping sites of right female breast: Secondary | ICD-10-CM

## 2017-06-20 DIAGNOSIS — Z853 Personal history of malignant neoplasm of breast: Secondary | ICD-10-CM | POA: Diagnosis not present

## 2017-06-20 DIAGNOSIS — M8580 Other specified disorders of bone density and structure, unspecified site: Secondary | ICD-10-CM

## 2017-06-20 LAB — COMPREHENSIVE METABOLIC PANEL
ALBUMIN: 3.8 g/dL (ref 3.5–5.0)
ALK PHOS: 102 U/L (ref 40–150)
ALT: 33 U/L (ref 0–55)
ANION GAP: 9 meq/L (ref 3–11)
AST: 31 U/L (ref 5–34)
BUN: 12.5 mg/dL (ref 7.0–26.0)
CALCIUM: 10.1 mg/dL (ref 8.4–10.4)
CHLORIDE: 108 meq/L (ref 98–109)
CO2: 28 mEq/L (ref 22–29)
Creatinine: 0.7 mg/dL (ref 0.6–1.1)
EGFR: 88 mL/min/{1.73_m2} — ABNORMAL LOW (ref >90)
Glucose: 101 mg/dl (ref 70–140)
POTASSIUM: 3.9 meq/L (ref 3.5–5.1)
Sodium: 145 mEq/L (ref 136–145)
Total Bilirubin: 0.59 mg/dL (ref 0.20–1.20)
Total Protein: 7.2 g/dL (ref 6.4–8.3)

## 2017-06-20 LAB — CBC WITH DIFFERENTIAL/PLATELET
BASO%: 0.3 % (ref 0.0–2.0)
BASOS ABS: 0 10*3/uL (ref 0.0–0.1)
EOS ABS: 0.2 10*3/uL (ref 0.0–0.5)
EOS%: 2.5 % (ref 0.0–7.0)
HEMATOCRIT: 43.5 % (ref 34.8–46.6)
HEMOGLOBIN: 14.7 g/dL (ref 11.6–15.9)
LYMPH#: 2.8 10*3/uL (ref 0.9–3.3)
LYMPH%: 38.2 % (ref 14.0–49.7)
MCH: 30.1 pg (ref 25.1–34.0)
MCHC: 33.8 g/dL (ref 31.5–36.0)
MCV: 89.1 fL (ref 79.5–101.0)
MONO#: 0.5 10*3/uL (ref 0.1–0.9)
MONO%: 6.8 % (ref 0.0–14.0)
NEUT#: 3.8 10*3/uL (ref 1.5–6.5)
NEUT%: 52.2 % (ref 38.4–76.8)
Platelets: 252 10*3/uL (ref 145–400)
RBC: 4.88 10*6/uL (ref 3.70–5.45)
RDW: 13.2 % (ref 11.2–14.5)
WBC: 7.3 10*3/uL (ref 3.9–10.3)

## 2017-06-20 NOTE — Progress Notes (Signed)
ID: Nichole [name]   DOB: [birth year]  MR#: [id number]  XVQ#:[id number]  Patient Care Team: Colon Branch, MD as PCP - General (Internal Medicine) Magrinat, Virgie Dad, MD (Hematology and Oncology) Carol Ada, MD (Gastroenterology) Princess Bruins, MD as Consulting Physician (Obstetrics and Gynecology)  CHIEF COMPLAINT:  Estrogen receptor positive Breast Cancer  CURRENT TREATMENT: Completing 5 years of anastrozole  HISTORY OF PRESENT ILLNESS: From the original intake note:  The patient herself noted a mass in her right breast and brought it to her physician's attention. She was living in [place] at that time. An ultrasound was done, showing a 1.7 cm spiculated nodule in the right breast with associated calcifications. There were no other findings of concern.  Lumpectomy was performed, with reportedly negative margins (I do not have those records). In Dr. Rosendo Gros note 11/04/2011 this tumor is described as triple positive.  The patient moved to [facility] for further treatment, and sentinel lymph node sampling in December of 2012 was negative. The original tissue obtained in [place] was reviewed, showing a 1.6 cm, grade 3 invasive ductal carcinoma. and the prognostic panel obtained in [place] showed the tumor to be estrogen receptor positive, progesterone receptor negative, and HER-2 negative in the invasive tumor (it was positive in the in situ tumor; S[facility] 12-30 at [facility]). (A note from that report states that HER-2 by [facility] was performed at [facility] and was reportedly positive in the invasive carcinoma. That report is not available to me today.)  The patient completed adjuvant radiation and was reassessed with an [facility] panel, which showed the tumor to be HER-2 negative, with an MIB-1 of 42%, again estrogen receptor positive and progesterone receptor negative. This panel predicted a 43% risk of recurrence at 8 years. The patient also had an Oncotype  DX sent. This was again ER positive, PR negative, and HER-2 negative, and predicted a 34% risk of distant recurrence at 10 years if the patient's only adjuvant systemic treatment was tamoxifen for 5 years. With these numbers Dr. Curlene Labrum discussed adjuvant chemotherapy with the patient, and proposed carboplatin, docetaxel, and Herceptin, which was started on February 2013. On a note from 11/12/2011, Dr. Curlene Labrum states "even though the HER-2-neu is negative, I am still opting for Herceptin based on the aggressiveness of the tumor." The chemotherapy was completed in June 2013. Herceptin was continued, the plan being to complete one year.   The patient's subsequent history is as detailed below.  INTERVAL HISTORY: Nichole [name] returns today for follow-up of her estrogen receptor positive breast cancer. She is accompanied by her husband. She continues on anastrozole, with good tolerance. She has an increase in vaginal dryness that has decreased her libido and causes dyspareunia. Hot flashes are not a major issue. She obtains a drug at a good price.   She was evaluated by her PCP, Dr. [name] recently for a raised skin areas similar to lipomas  particularly in her upper extremities and she tells she will be scheduled for surgical biopsy of one of these lesions.  REVIEW OF SYSTEMS  Nichole [name] reports she is not exercising at this time. She is a night owl. She denies unusual headaches, visual changes, nausea, vomiting, or dizziness. There has been no unusual cough, phlegm production, or pleurisy. This been no change in bowel or bladder habits. She denies unexplained fatigue or unexplained weight loss, bleeding, rash, or fever. A detailed review of systems was otherwise negative.   PAST MEDICAL HISTORY: Past Medical History:  Diagnosis  Date  . Breast cancer ([facility])   . Cancer [facility]) december 2012   right breast  . History of breast cancer 07/2011   right  . History of chemotherapy 11/2011  . Hyperlipidemia    no current med.   . SVD (spontaneous vaginal delivery)    x 3    PAST SURGICAL HISTORY: Past Surgical History:  Procedure Laterality Date  . BREAST LUMPECTOMY Right 08/2011  . BREAST LUMPECTOMY Right   . CARPAL TUNNEL RELEASE Left 06/19/2013   Procedure: CARPAL TUNNEL RELEASE;  Surgeon: Wyn Forster., MD;  Location: [facility];  Service: Orthopedics;  Laterality: Left;  . CESAREAN SECTION     x 1  . COLON SURGERY     Hx polyps  . LAPAROSCOPIC SALPINGO OOPHERECTOMY Bilateral 04/13/2016   Procedure: LAPAROSCOPIC SALPINGO OOPHORECTOMY;  Surgeon: Ok Edwards, MD;  Location: [facility];  Service: Gynecology;  Laterality: Bilateral;  . PORT-A-CATH REMOVAL  06/2012  . PORTACATH PLACEMENT  2013  . SENTINEL LYMPH NODE BIOPSY Right 08/2011  . WISDOM TOOTH EXTRACTION      FAMILY HISTORY Family History  Problem Relation Age of Onset  . Colon cancer Mother        dx postpartum  . Congestive Heart Failure Mother   . Alzheimer's disease Mother   . Diabetes Father   . Breast cancer Maternal Aunt   . Breast cancer Cousin 51  . Diabetes Paternal Grandmother   . Breast cancer Maternal Aunt   . AAA (abdominal aortic aneurysm) Neg Hx    The patient's father died at the age of 61. The patient's mother died at the age of 68 after a fall, in the setting of Alzheimer's disease. The patient has no siblings. There are some second-degree relatives with breast cancer. She has been tested for the BRCA 1 and 2 mutations, and was found to be negative.  GYNECOLOGIC HISTORY: Menarche age 24. First live birth age 1. She is GX P4. Last menstrual period 2006. Status post hormone replacement, discontinued November 2012.  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 Her husband, Nichole [name], recently retired from the [employer]. This is a second marriage for both of them. Pat's 4 children are Nichole [name], 41, who  lives in [place] and works as a Veterinary surgeon; [name], who works as a Personnel officer for an Public relations account executive firm in [place]; [name], who works as an Art gallery manager in [place]; and the youngest, [name], 28, who works as a Naval architect in [place]. The patient has 4 grandchildren and one on the way. She is a Air traffic controller.  ADVANCED DIRECTIVES: not in place  HEALTH MAINTENANCE:  (Updated November 2014) Social History  Substance Use Topics  . Smoking status: Former Smoker    Years: 3.00    Types: Cigarettes    Quit date: 09/13/2001  . Smokeless tobacco: Never Used     Comment: social-light smoker  . Alcohol use No     Colonoscopy: Oct 2013/DrElnoria Howard (Repeat in 2018)  PAP:  Oct 2014, Dr. Lily Peer  Bone density:  Lipid panel:  Nov 2014, elevated    Allergies  Allergen Reactions  . Penicillins Rash    Has patient had a PCN reaction causing immediate rash, facial/tongue/throat swelling, SOB or lightheadedness with hypotension: No Has patient had a PCN reaction causing severe rash involving mucus membranes or skin necrosis: No Has patient had a  PCN reaction that required hospitalization No Has patient had a PCN reaction occurring within the last 10 years: Yes If all of the above answers are "NO", then may proceed with Cephalosporin use.   . Poractant Alfa Rash  . Pork-Derived Products Rash    Current Outpatient Prescriptions  Medication Sig Dispense Refill  . anastrozole (ARIMIDEX) 1 MG tablet Take 1 tablet (1 mg total) by mouth daily. 90 tablet 3  . Ascorbic Acid (VITAMIN C PO) Take by mouth.    . Calcium Carbonate (CALTRATE 600 PO) Take 1 tablet by mouth daily.    . Cholecalciferol (VITAMIN D PO) Take 1 tablet by mouth daily.    Marland Kitchen omeprazole (PRILOSEC OTC) 20 MG tablet Take 20 mg by mouth daily.    . rosuvastatin (CRESTOR) 5 MG tablet Take 5 mg by mouth at bedtime.     No current facility-administered medications for this visit.      OBJECTIVE: Middle-aged white woman In no acute distress  Vitals:   06/20/17 1404  BP: 131/72  Pulse: 67  Resp: 20  Temp: 98.2 F (36.8 C)  SpO2: 99%     Body mass index is 36.32 kg/m.    ECOG FS: 0 Filed Weights   06/20/17 1404  Weight: 173 lb 12.8 oz (78.8 kg)   Sclerae unicteric, EOMs intact Oropharynx clear and moist No cervical or supraclavicular adenopathy Lungs no rales or rhonchi Heart regular rate and rhythm Abd soft, nontender, positive bowel sounds MSK no focal spinal tenderness, no upper extremity lymphedema Neuro: nonfocal, well oriented, appropriate affect Breasts: The right breast is undergone lumpectomy followed by radiation, with no evidence of disease recurrence. The left breast is benign. Both axillae are benign. Skin: Nichole [name] has 2 subcutaneous nodules both in the dorsal aspect of the upper arm just above the elbow. This is not erythematous or painful to palpation. She describes them as itchy.  LAB RESULTS: Lab Results  Component Value Date   WBC 7.3 06/20/2017   NEUTROABS 3.8 06/20/2017   HGB 14.7 06/20/2017   HCT 43.5 06/20/2017   MCV 89.1 06/20/2017   PLT 252 06/20/2017      Chemistry      Component Value Date/Time   NA 145 06/20/2017 1355   K 3.9 06/20/2017 1355   CL 103 05/24/2014 1650   CL 110 (H) 01/08/2013 0821   CO2 28 06/20/2017 1355   BUN 12.5 06/20/2017 1355   CREATININE 0.7 06/20/2017 1355      Component Value Date/Time   CALCIUM 10.1 06/20/2017 1355   ALKPHOS 102 06/20/2017 1355   AST 31 06/20/2017 1355   ALT 33 06/20/2017 1355   BILITOT 0.59 06/20/2017 1355     Lipid Panel     Component Value Date/Time   CHOL 190 06/13/2017 0940   TRIG 198.0 (H) 06/13/2017 0940   HDL 52.20 06/13/2017 0940   CHOLHDL 4 06/13/2017 0940   VLDL 39.6 06/13/2017 0940   LDLCALC 98 06/13/2017 0940      STUDIES: Most recent bilateral diagnostic mammography with tomography at [facility] 11/23/2016 found the breast density to be  category B. There was no evidence of malignancy.  Also, on 11/15/2016, for evaluation of a nontraumatic posterior right rib cage pain,  right rib films were obtained. These showed no acute findings.  ASSESSMENT: 66 y.o. BRCA negative [place] woman recently moved to [facility], s/p Right lumpectomy December 2012 for a pT1c pN0, stage 1A invasive ductal carcinoma, grade 3, estrogen receptor positive, progesterone  receptor negative, with a Ki-67 of 42%  (1) Oncotype DX recurrence score 54% predicting a 10-year risk of distant recurrence of 34% if the only adjuvant systemic treatment is tamoxifen x 5 years  (1) s/p adjuvant radiation completed February 2013  (2) s/p six cycles of carboplatin/ docetaxel/ trastuzumab completed June 2012  (3) started tamoxifen 05/14/2012, discontinued September 2015  (4) started anastrozole 08/13/2014  (a) bone density obtained in a [facility]  08/06/2014 shows a T score of -1.8  (5)  Status post endometrial biopsy in October 2014 for a thickened endometrial lining, pathology benign   (a) repeat transvaginal ultrasound 08/06/2015 found an endometrial stripe measuring 5.5 cm.  PLAN:  Patis now 5-1/2 years out from definitive surgery for her breast cancer with no evidence of disease recurrence. This is very favorable.  She has completed 5 years of anti-estrogens, including more than 2 years of aromatase inhibitors. In cases at high risk of recurrence (for instance node-positive patients) an additional 2 years of anastrozole might be indicated. In her case the benefit is likely to be minimal to none.  She expressed an interest in participating in our intimacy and pelvic health program and I was glad to place that referral in for her.  At this point I feel comfortable releasing her to her primary care physician. As she will need in terms of breast cancer screening is her yearly mammography and a yearly physician breast exam  I will be glad  to see her again at any point in the future if on when the need arises but as of now were making no further routine appointment for her here.   Chauncey Cruel, MD 06/20/17 6:10 PM Medical Oncology and Hematology [facility] [address] [place], [place] [address] Tel. [phone] Fax. [phone]  This document serves as a record of services personally performed by Lurline Del, MD. It was created on her behalf by Steva Colder, a trained medical scribe. The creation of this record is based on the scribe's personal observations and the provider's statements to them. This document has been checked and approved by the attending provider.

## 2017-06-21 ENCOUNTER — Telehealth: Payer: Self-pay | Admitting: Oncology

## 2017-06-21 NOTE — Telephone Encounter (Signed)
Per 10/8 - no los at checkout °

## 2017-07-07 ENCOUNTER — Encounter: Payer: Federal, State, Local not specified - PPO | Admitting: Obstetrics & Gynecology

## 2017-07-19 ENCOUNTER — Encounter: Payer: Self-pay | Admitting: Internal Medicine

## 2017-08-01 ENCOUNTER — Encounter: Payer: Self-pay | Admitting: Obstetrics & Gynecology

## 2017-08-01 ENCOUNTER — Ambulatory Visit: Payer: Federal, State, Local not specified - PPO | Admitting: Obstetrics & Gynecology

## 2017-08-01 VITALS — BP 126/78 | Ht <= 58 in | Wt 179.0 lb

## 2017-08-01 DIAGNOSIS — Z01411 Encounter for gynecological examination (general) (routine) with abnormal findings: Secondary | ICD-10-CM

## 2017-08-01 DIAGNOSIS — C50811 Malignant neoplasm of overlapping sites of right female breast: Secondary | ICD-10-CM | POA: Diagnosis not present

## 2017-08-01 DIAGNOSIS — N952 Postmenopausal atrophic vaginitis: Secondary | ICD-10-CM

## 2017-08-01 DIAGNOSIS — E6609 Other obesity due to excess calories: Secondary | ICD-10-CM

## 2017-08-01 DIAGNOSIS — Z6837 Body mass index (BMI) 37.0-37.9, adult: Secondary | ICD-10-CM

## 2017-08-01 DIAGNOSIS — Z780 Asymptomatic menopausal state: Secondary | ICD-10-CM | POA: Diagnosis not present

## 2017-08-01 DIAGNOSIS — Z170 Estrogen receptor positive status [ER+]: Secondary | ICD-10-CM

## 2017-08-01 NOTE — Progress Notes (Signed)
Nichole [name] 1951/01/30 [id number]   History:    66 y.o. G4P4L4 Married  RP:  Established patient presenting for annual gyn exam  HPI:   History of right breast invasive ductal carcinoma estrogen receptor positive diagnosed in 2012.  Status post right breast lumpectomy, radiation therapy, chemotherapy, tamoxifen, and Anastrozole which she finished this year October 2018.  Had a laparoscopic bilateral salpingo-oophorectomy April 13, 2016.  No postmenopausal bleeding.  No pelvic pain.  Breasts normal.  Urine and bowel movements normal.  Health labs with family physician.  Past medical history,surgical history, family history and social history were all reviewed and documented in the EPIC chart.  Gynecologic History No LMP recorded. Patient is postmenopausal. Contraception: post menopausal status and S/P BSO Last Pap: 2017. Results were: Negative Last mammogram: 10/2016. Results were: Benign Colono 2013 Bone Density 2017 Normal per patient  Obstetric History OB History  Gravida Para Term Preterm AB Living  4 4 4     4   SAB TAB Ectopic Multiple Live Births          4    # Outcome Date GA Lbr Len/2nd Weight Sex Delivery Anes PTL Lv  4 Term     M CS-Unspec  N LIV  3 Term     M Vag-Spont  N LIV  2 Term     M Vag-Spont  N LIV  1 Term     M Vag-Spont  N LIV       ROS: A ROS was performed and pertinent positives and negatives are included in the history.  GENERAL: No fevers or chills. HEENT: No change in vision, no earache, sore throat or sinus congestion. NECK: No pain or stiffness. CARDIOVASCULAR: No chest pain or pressure. No palpitations. PULMONARY: No shortness of breath, cough or wheeze. GASTROINTESTINAL: No abdominal pain, nausea, vomiting or diarrhea, melena or bright red blood per rectum. GENITOURINARY: No urinary frequency, urgency, hesitancy or dysuria. MUSCULOSKELETAL: No joint or muscle pain, no back pain, no recent trauma. DERMATOLOGIC: No rash, no itching, no lesions.  ENDOCRINE: No polyuria, polydipsia, no heat or cold intolerance. No recent change in weight. HEMATOLOGICAL: No anemia or easy bruising or bleeding. NEUROLOGIC: No headache, seizures, numbness, tingling or weakness. PSYCHIATRIC: No depression, no loss of interest in normal activity or change in sleep pattern.     Exam:   BP 126/78   Ht 4\' 10"  (1.473 m)   Wt 179 lb (81.2 kg)   BMI 37.41 kg/m   Body mass index is 37.41 kg/m.  General appearance : Well developed well nourished female. No acute distress HEENT: Eyes: no retinal hemorrhage or exudates,  Neck supple, trachea midline, no carotid bruits, no thyroidmegaly Lungs: Clear to auscultation, no rhonchi or wheezes, or rib retractions  Heart: Regular rate and rhythm, no murmurs or gallops Breast:Examined in sitting and supine position were symmetrical in appearance, no palpable masses or tenderness,  no skin retraction, no nipple inversion, no nipple discharge, no skin discoloration, no axillary or supraclavicular lymphadenopathy Abdomen: no palpable masses or tenderness, no rebound or guarding Extremities: no edema or skin discoloration or tenderness  Pelvic: Vulva normal  Bartholin, Urethra, Skene Glands: Within normal limits             Vagina: No gross lesions or discharge  Cervix: No gross lesions or discharge.  Pap reflex done.  Uterus  AV, normal size, shape and consistency, non-tender and mobile  Adnexa  Without masses or tenderness  Anus and perineum  normal     Assessment/Plan:  67 y.o. female for annual exam   1. Encounter for gynecological examination with abnormal finding Normal gynecologic exam status post bilateral salpingo-oophorectomy.  Pap reflex done.  Breast exam normal.  Mammogram benign February 2018.  Colonoscopy 2013.  Health labs with family physician.  2. Menopause present No hormone replacement therapy.  No post menopausal bleeding.  Vitamin D supplements, calcium rich nutrition and weightbearing  physical activity.  Will repeat bone density in 2019.  3. 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  Will try Replens for dryness and Astroglide for intercourse.  May also try coconut oil with intercourse.  4. Malignant neoplasm of overlapping sites of right breast in female, estrogen receptor positive ([place]) Finished 5 years of Anastrozole in October 2018.  5. Class 2 obesity due to excess calories without serious comorbidity with body mass index (BMI) of 37.0 to 37.9 in adult Low calorie, low carb diet like the [employer] recommended.  Regular aerobic physical activity at least 5 times a week and weightlifting every 2 days encouraged.  Counseling on above issues >50% x 10 minutes.  Princess Bruins MD, 12:17 PM 08/01/2017

## 2017-08-02 ENCOUNTER — Encounter: Payer: Self-pay | Admitting: Obstetrics & Gynecology

## 2017-08-02 ENCOUNTER — Encounter: Payer: Self-pay | Admitting: Anesthesiology

## 2017-08-02 NOTE — Patient Instructions (Signed)
1. Encounter for gynecological examination with abnormal finding Normal gynecologic exam status post bilateral salpingo-oophorectomy.  Pap reflex done.  Breast exam normal.  Mammogram benign February 2018.  Colonoscopy 2013.  Health labs with family physician.  2. Menopause present No hormone replacement therapy.  No post menopausal bleeding.  Vitamin D supplements, calcium rich nutrition and weightbearing physical activity.  Will repeat bone density in 2019.  3. 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  Will try Replens for dryness and Astroglide for intercourse.  May also try coconut oil with intercourse.  4. Malignant neoplasm of overlapping sites of right breast in female, estrogen receptor positive ([place]) Finished 5 years of Anastrozole in October 2018.  5. Class 2 obesity due to excess calories without serious comorbidity with body mass index (BMI) of 37.0 to 37.9 in adult Low calorie, low carb diet like the [employer] recommended.  Regular aerobic physical activity at least 5 times a week and weightlifting every 2 days encouraged.  Priscille Loveless un placer conocerla hoy!  Voy a informarla de [employer]!   Mantenimiento de la salud de las mujeres posmenopusicas (Health Maintenance for Postmenopausal Women) La menopausia es un proceso normal en el cual se pierde la capacidad reproductiva. Este proceso ocurre gradualmente a lo largo de un perodo de meses o aos, por lo general entre los 46 y los 55aos. La menopausia es completa cuando no se han tenido 87mnstruaciones consecutivas. Es importante hablar con el mdico sobre algunas de las enfermedades ms comunes que afectan a las mujeres posmenopusicas, como la cardiopata coronaria, el cncer y la prdida de la masa sea (osteoporosis). Adoptar un estilo de vida saludable y recibir atencin preventiva pueden ayudar a promover  la salud y eMusician Adems, estas medidas pueden reducir las probabilidades de desarrollar algunas de estas enfermedades frecuentes. QU DEBO SABER ACERCA DE LSaltaire Durante le mFreedom puede tener una serie de sntomas, por ejemplo:  Calores repentinos moderados a graves.  Sudoracin nocturna.  Disminucin del deseo sexual.  Cambios en el estado de nimo.  Dolores de cNetherlands  Cansancio.  Irritabilidad.  Problemas de memoria.  Insomnio. Tratar o no los cambios que ocurren en la menopausia es una decisin personal que se toma con el mdico. QU DEBO SABER SOBRE LOS TRATAMIENTOS DE REPOSICIN HORMONAL Y LOS SUPLEMENTOS? Los productos para la terapia hormonal son eficaces para tratar los sntomas que se asocian con la menopausia, como los calores repentinos y las sudoraciones nocturnas. La reposicin hormonal conlleva ciertos riesgos, especialmente a medida que una mujer envejece. Si est pensando en usar tratamientos con estrgeno o estrgeno con progesterona, analice los beneficios y los riesgos con el mdico. QU DEBO SABER SMarbleLNew Florence A medida que una persona envejece, aumenta la probabilidad de tener cardiopata coronaria, infarto de miocardio e ictus. Esto puede deberse, en parte, a los cambios hormonales que atraviesa el cuerpo durante la menopausia. Estos cambios pueden afectar la forma en que el organismo procesa las gNorth Harlem Colony los triglicridos y el colesterol de su dieta. El infarto de miocardio y el ictus son emergencias mdicas. Hay muchas cosas que se pueden hacer para ayudar a prevenir la cardiopata coronaria y el ictus:  Debe controlar su presin arterial al menos cada uno o dKremmling La hipertensin arterial causa enfermedades cardacas y aSerbiael riesgo de ictus.  Si tiene entre 555y 79aos, consulte al mdico si debe tomar aspirina  para prevenir un infarto de miocardio o un ictus.  No consuma ningn producto que  contenga tabaco, lo que incluye cigarrillos, tabaco de Higher education careers adviser o Psychologist, sport and exercise. Si necesita ayuda para dejar de fumar, consulte al [employer].  Es importante seguir una dieta sana y Theatre manager un peso saludable. ? Asegrese de [employer] verduras, frutas, productos lcteos de bajo contenido de [place] y Advertising account planner. ? No consuma alimentos con alto contenido de grasas slidas, azcares agregados o sal (sodio).  Realice actividad fsica con regularidad. Esta es una de las cosas ms importantes que puede hacer por su salud. ? Intente realizar al menos 120mnutos de actividad fsica por semana. El tipo de ejercicio que realice debe aumentar la frecuencia cardaca y hacerla sudar. Esto se conoce como ejercicio de iMalta ? Intente hacer ejercicios de elongacin por lo menos dos veces por semana. Agrguelos al plan de ejercicio de intensidad moderada.  Conozca sus cifras. Pdale al mdico que le controle el colesterol y el nivel sanguneo de glucosa. Siga hacindose anlisis de sAmerican Electric Powerse lo haya indicado el mdico. QU DEBO SABER SOBRE LAS PRUEBAS DE DETECCIN DEL CNCER? Hay varios tipos de cncer. Tome las siguientes medidas para reducir el riesgo y dActuaryformacin cancerosa lo antes posible. Cncer de mama  Practique la autoconciencia de la mama. ? Esto significa reconocer la apariencia normal de sus mamas y cmo las siente. ? Tambin significa realizar autoexmenes regulares de las mDeWitt Informe a su mdico sobre cualquier cambio, sin importar cun pequeo sea.  Si es mayor de 40aos, visite a un mdico para qPublic librarianun examen de las mamas (exploracin clnica mamaria o ECM) todos los aRaymond En funcin de sToysRus los antecedentes familiares y la historia cBlaine tal vez sea recomendable que tambin se haga una radiografa anual de las mamas ([place].  Si tiene antecedentes familiares de cncer de mama, hable con el mdico para someterse a un  estudio gentico.  Si tiene alto riesgo de pChief Financial Officerde mama, hable con el mdico para hacerse a uPublic house manager(RM) y uLavinia Sharpstodos los aGreen Bank  La evaluacin del gen del cncer de mama (BRCA) se recomienda a las mujeres que tengan familiares con tumores malignos relacionados con el BRCA. Los resultados de la evaluacin determinarn la necesidad de recibir asesoramiento gentico y pBuilding services engineerde deteccin del BRCA1 y el BRCA2. Los tumores malignos relacionados con el BRCA incluyen estos tipos: ? Mama. Este tipo se presenta en hombres o mujeres. ? Ovario. ? Trompas. A este tipo tambin se lo llama cncer de trompa de Falopio. ? Cncer de la pared abdominal o plvica (cncer de peritoneo). ? Prstata. ? Pncreas. Cncer de cuello uterino, de tero y de ovario El mdico puede recomendarle que se haga pruebas peridicas de deteccin de cncer de los rganos de la pelvis, los cuales iVerizonovarios, el tero y la vagina. Estas pruebas incluyen un examen plvico, que abarca controlar si se produjeron cambios microscpicos en la superficie del cuello del tero (prueba de Papanicolaou).  A las mujeres que tCircuit City21 y 674aos los mdicos pueden recomendarles que se realicen un examen plvico y uArdelia Memsprueba de Papanicolaou cada tres aos. A las mujeres que tienen entre 30 y 65aos, pueden recomendarles la prueba de Papanicolaou y el examen plvico, en combinacin con una prueba de deteccin del virus del papiloma humano (VPH) cBurley Algunos tipos de VPH aumentan el riesgo de pChief Financial Officerde cuello del tero. La  prueba para la deteccin del VPH tambin puede realizarse a mujeres de cualquier edad cuyos resultados de la prueba de Papanicolaou no sean claros.  Es posible que otros mdicos no recomienden exmenes de deteccin a las mujeres no embarazadas que se consideran sujetos de bajo riesgo de Chief Financial Officer de pelvis y no tienen sntomas. Pregntele al mdico si un  examen plvico de deteccin es adecuado para usted.  Si ha recibido un tratamiento para Science writer cervical o una enfermedad que podra causar cncer, necesitar realizarse una prueba de Papanicolaou y controles durante al menos 66 aos de concluido el [place]. Si no se ha hecho el Papanicolaou con regularidad, debern volver a evaluarse los factores de riesgo (como tener un nuevo compaero sexual), para Teacher, adult education si debe empezar a Dispensing optician los estudios nuevamente. Algunas mujeres sufren problemas mdicos que aumentan la probabilidad de Museum/gallery curator cncer de cuello del tero. En estos casos, el mdico podr [employer] se realicen controles y pruebas de Papanicolaou con ms frecuencia.  Si tiene antecedentes familiares de cncer de tero o de ovario, hable con el mdico para someterse a un estudio gentico.  Si tiene hemorragia vaginal despus de la menopausia, informe al mdico.  En la actualidad, no hay pruebas confiables para la deteccin del cncer de ovario. Cncer de pulmn Se recomienda realizar exmenes de deteccin de cncer de pulmn a personas adultas entre 4 y 42 aos que estn en riesgo de Horticulturist, commercial de pulmn por sus antecedentes de consumo de tabaco. Se recomienda una tomografa computarizada (TC) de baja dosis de los pulmones todos los aos si usted:  Fuma actualmente.  Ha fumado durante 30aos un paquete diario y sigue fumando o dej el hbito en algn momento en los ltimos 15aos. Un paquete-ao equivale a fumar en promedio un paquete de cigarrillos diario durante un ao. Los exmenes de deteccin anuales:  Deben hacerse hasta que hayan pasado 15aos desde que dej de fumar.  Deben dejar de realizarse si tiene un problema de salud que le impide recibir tratamiento para el cncer de pulmn. Cncer colorrectal  Este tipo de cncer puede detectarse y a menudo prevenirse.  El estudio de [place] de Programme researcher, broadcasting/film/video del cncer colorrectal debe comenzar a Electrical engineer a Proofreader  de los 20aos y [place].  El mdico puede aconsejarle que lo haga antes, si tiene factores de riesgo de Best boy cncer de colon.  Si tiene antecedentes familiares de cncer colorrectal, hable con el mdico para someterse a un estudio gentico.  El mdico tambin puede recomendarle que use un kit de prueba para Engineer, mining a fin de Educational psychologist en la materia fecal.  Es posible que se use una pequea cmara en el extremo de un tubo para examinar directamente el colon (sigmoidoscopia o colonoscopia) a fin de Hydrographic surveyor formas tempranas de cncer colorrectal.  El examen directo del colon se debe repetir cada 5 a 10aos hasta los 41aos. Sin embargo, si se hallan formas incipientes de plipos precancerosos o pequeos tumores, o si tiene antecedentes familiares o riesgo gentico de Therapist, music, debe realizarse exmenes de deteccin con ms frecuencia. Cncer de piel  Revise la piel de la cabeza a los pies con regularidad.  Contrlese los lunares. Infrmele al mdico: ? Si aparecen nuevos lunares o los que tiene se modifican, especialmente en su forma o color. ? Si tiene un lunar que es ms grande que el tamao de una goma de Games developer.  Si alguno de los miembros de su familia tiene  antecedentes de cncer de piel, especialmente a una edad temprana, hable con el mdico para someterse a pruebas genticas.  Siempre use pantalla solar. Aplique pantalla solar de Kerry Dory y repetida a lo largo del Training and development officer.  Protjase usando mangas y [employer], un sombrero de ala ancha y gafas para el sol, siempre que est al [place]. QU DEBO SABER SOBRE LA OSTEOPOROSIS? La osteoporosis es una afeccin en la cual la destruccin de la masa sea ocurre con mayor rapidez que su formacin. Despus de la menopausia, puede correr un riesgo ms alto de tener osteoporosis. Para ayudar a prevenir esta afeccin o las fracturas seas que pueden ocurrir a causa de [place], se recomienda lo  siguiente:  Si tiene entre 19 y 50aos, tome como mnimo 1040m de calcio y 6066mde vitaminaD por daTraining and development officer Si es mayor de 50aos pero menor de 70aos, tome como mnimo 120026me calcio y 600m79m vitaminaD por da. Training and development officeri es mayor de 70aos, tome como mnimo 1200mg10mcalcio y 800mg 37mitaminaD por da. ElTraining and development officerabaquismo y el consumo excesivo de alcohol aumentan el riesgo de osteoporosis. Consuma alimentos ricos en calcio y vitaminaD, y haga ejercicios con soporte de peso varias veces a la semana, como se lo haya indicado el mdico. QU DEBO SABER SOBRE EL MODO EN QUE LA MENOPAUSIA AFECTA MI SALUD MENTAL? La depresin puede presentarse a cualquier edad, pero es ms frecuente a medida que una persona envejece. Los sntomas comunes de depresin incluyen lo siguiente:  Desnimo o tristeza.  Cambios en los patrones de sueo.  Cambios en el apetito o en los hbitos de alimentacin.  Sensacin de falta general de motivacin o placer al realizYahooidades que sola disfrutar.  Crisis frecuentes de llanto. Hable con el mdico si cree que tiene depresin. QU DEBO SABER SOBRE LAS VACUNAS? Es importante que se aplique las vacunas y las maLowrys incluyen las siguientes:  Vacuna contra el ttanos, la difteria y la tosferina (Tdap).  Vacuna anual contra la gripe antes del inicio de la temporada de gripe.  Vacuna contra la neumona.  Vacuna contra el herpes. El mdico tambin puede recomendarle que se aplique otras vacunas. Esta informacin no tiene como fMarine scientistnsejo del mdico. Asegrese de hacerle al mdico cualquier pregunta que tenga. Document Released: 06/20/2013 Document Revised: 09/20/2014 Document Reviewed: 06/03/2015 Elsevier Interactive Patient Education  2018 Else[employer]

## 2017-08-05 LAB — PAP IG W/ RFLX HPV ASCU

## 2017-08-08 DIAGNOSIS — R229 Localized swelling, mass and lump, unspecified: Secondary | ICD-10-CM | POA: Diagnosis not present

## 2017-08-09 ENCOUNTER — Telehealth: Payer: Self-pay | Admitting: *Deleted

## 2017-08-09 NOTE — Telephone Encounter (Signed)
-----   Message from Princess Bruins, MD sent at 08/01/2017 12:26 PM EST ----- Regarding: Refer to Orthopedist Right Knee pain when kneeling and feeling/hearing like "small rocks" when bending the knee.

## 2017-08-09 NOTE — Telephone Encounter (Signed)
Patient scheduled on 08/11/17 @ 9:45am with Dr.XU (chew) at [facility] orthopedic [address], [place], [place] [address] [phone] if she needs to reschedule.

## 2017-08-11 ENCOUNTER — Ambulatory Visit (INDEPENDENT_AMBULATORY_CARE_PROVIDER_SITE_OTHER): Payer: Federal, State, Local not specified - PPO

## 2017-08-11 ENCOUNTER — Ambulatory Visit (INDEPENDENT_AMBULATORY_CARE_PROVIDER_SITE_OTHER): Payer: Federal, State, Local not specified - PPO | Admitting: Orthopaedic Surgery

## 2017-08-11 ENCOUNTER — Encounter (INDEPENDENT_AMBULATORY_CARE_PROVIDER_SITE_OTHER): Payer: Self-pay | Admitting: Orthopaedic Surgery

## 2017-08-11 DIAGNOSIS — M1711 Unilateral primary osteoarthritis, right knee: Secondary | ICD-10-CM

## 2017-08-11 NOTE — Progress Notes (Signed)
Office Visit Note   Patient: Nichole [name]           Date of Birth: March 02, 1951           MRN: [id number] Visit Date: 08/11/2017              Requested by: Colon Branch, [address] [place], [place] [address] PCP: Colon Branch, MD   Assessment & Plan: Visit Diagnoses:  1. Primary osteoarthritis of right knee     Plan: Impression is right knee chondromalacia patella.  I think her problem is mainly coming from degenerative changes of the patella.  I am able to see subchondral cysts on the lateral x-ray which I demonstrated to her.  We discussed multiple treatment options and she wishes to begin with Voltaren gel and a knee brace.  Home exercises were provided today.  Questions encouraged and answered.  Follow-up as needed.  Follow-Up Instructions: Return if symptoms worsen or fail to improve.   Orders:  Orders Placed This Encounter  Procedures  . XR KNEE 3 VIEW RIGHT   No orders of the defined types were placed in this encounter.     Procedures: No procedures performed   Clinical Data: No additional findings.   Subjective: Chief Complaint  Patient presents with  . Right Knee - Pain    Patient is a 66 year old female comes in with right knee pain that is worse with using stairs and kneeling.  This is been going on for 2 months.  She reports a sensation of rocks in her knee.  She denies any injuries or any real mechanical symptoms.  Denies any numbness and tingling.    Review of Systems  Constitutional: Negative.   HENT: Negative.   Eyes: Negative.   Respiratory: Negative.   Cardiovascular: Negative.   Endocrine: Negative.   Musculoskeletal: Negative.   Neurological: Negative.   Hematological: Negative.   Psychiatric/Behavioral: Negative.   All other systems reviewed and are negative.    Objective: Vital Signs: There were no vitals taken for this visit.  Physical Exam  Constitutional: She is oriented to person, place, and time. She  appears well-developed and well-nourished.  HENT:  Head: Normocephalic and atraumatic.  Eyes: EOM are normal.  Neck: Neck supple.  Pulmonary/Chest: Effort normal.  Abdominal: Soft.  Neurological: She is alert and oriented to person, place, and time.  Skin: Skin is warm. Capillary refill takes less than 2 seconds.  Psychiatric: She has a normal mood and affect. Her behavior is normal. Judgment and thought content normal.  Nursing note and vitals reviewed.   Ortho Exam Right knee exam shows no joint effusion.  Positive patellar crepitus.  Pain with patellar compression.  Collaterals and cruciates are stable. Specialty Comments:  No specialty comments available.  Imaging: Xr Knee 3 View Right  Result Date: 08/11/2017 Mainly patellofemoral arthritis with subchondral cysts    PMFS History: Patient Active Problem List   Diagnosis Date Noted  . PCP NOTES>>>>>>>>>>>>>>>>>> 11/21/2016  . Annual physical exam 11/19/2016  . Malignant neoplasm of overlapping sites of right breast in female, estrogen receptor positive ([place]) 11/15/2016  . Osteopenia 07/16/2015  . Other malaise and fatigue 05/24/2014  . Hypersomnolence 05/24/2014  . Chest pain, unspecified 05/24/2014  . Hot flashes due to tamoxifen 07/24/2013  . Dense breasts 07/24/2013  . Increased endometrial stripe thickness 07/18/2013  . Hyperlipidemia   . Family history of colon cancer 05/23/2012   Past Medical History:  Diagnosis  Date  . Breast cancer ([place])   . Cancer [facility]) december 2012   right breast  . History of breast cancer 07/2011   right  . History of chemotherapy 11/2011  . Hyperlipidemia    no current med.  . SVD (spontaneous vaginal delivery)    x 3    Family History  Problem Relation Age of Onset  . Colon cancer Mother        dx postpartum  . Congestive Heart Failure Mother   . Alzheimer's disease Mother   . Diabetes Father   . Breast cancer Maternal Aunt   . Breast cancer Cousin 24  . Diabetes  Paternal Grandmother   . Breast cancer Maternal Aunt   . AAA (abdominal aortic aneurysm) Neg Hx     Past Surgical History:  Procedure Laterality Date  . BREAST LUMPECTOMY Right 08/2011  . BREAST LUMPECTOMY Right   . CARPAL TUNNEL RELEASE Left 06/19/2013   Procedure: CARPAL TUNNEL RELEASE;  Surgeon: Cammie Sickle., MD;  Location: [facility];  Service: Orthopedics;  Laterality: Left;  . CESAREAN SECTION     x 1  . COLON SURGERY     Hx polyps  . LAPAROSCOPIC SALPINGO OOPHERECTOMY Bilateral 04/13/2016   Procedure: LAPAROSCOPIC SALPINGO OOPHORECTOMY;  Surgeon: Terrance Mass, MD;  Location: [facility];  Service: Gynecology;  Laterality: Bilateral;  . PORT-A-CATH REMOVAL  06/2012  . PORTACATH PLACEMENT  2013  . SENTINEL LYMPH NODE BIOPSY Right 08/2011  . WISDOM TOOTH EXTRACTION     Social History   Occupational History  . Occupation: retired from the [employer]  . Smoking status: Former Smoker    Years: 3.00    Types: Cigarettes    Last attempt to quit: 09/13/2001    Years since quitting: 15.9  . Smokeless tobacco: Never Used  . Tobacco comment: social-light smoker  Substance and Sexual Activity  . Alcohol use: No  . Drug use: No  . Sexual activity: Yes    Partners: Male    Birth control/protection: Post-menopausal    Comment: 1st intercourse- 37, partners- 65, married- 12 yrs

## 2017-08-11 NOTE — Telephone Encounter (Signed)
Patient was informed.

## 2017-08-14 DIAGNOSIS — J209 Acute bronchitis, unspecified: Secondary | ICD-10-CM | POA: Diagnosis not present

## 2017-08-14 DIAGNOSIS — J019 Acute sinusitis, unspecified: Secondary | ICD-10-CM | POA: Diagnosis not present

## 2017-09-21 ENCOUNTER — Other Ambulatory Visit: Payer: Self-pay | Admitting: Oncology

## 2017-10-24 ENCOUNTER — Other Ambulatory Visit: Payer: Self-pay

## 2017-10-24 ENCOUNTER — Encounter: Payer: Self-pay | Admitting: Oncology

## 2017-10-24 ENCOUNTER — Other Ambulatory Visit: Payer: Self-pay | Admitting: Oncology

## 2017-10-24 DIAGNOSIS — Z170 Estrogen receptor positive status [ER+]: Secondary | ICD-10-CM

## 2017-10-24 DIAGNOSIS — C50811 Malignant neoplasm of overlapping sites of right female breast: Secondary | ICD-10-CM

## 2017-10-27 ENCOUNTER — Ambulatory Visit
Admission: RE | Admit: 2017-10-27 | Discharge: 2017-10-27 | Disposition: A | Discharge status: Home / Self Care | Payer: Federal, State, Local not specified - PPO | Source: Ambulatory Visit | Attending: Oncology | Admitting: Oncology

## 2017-10-27 ENCOUNTER — Ambulatory Visit: Admission: RE | Admit: 2017-10-27 | Payer: Federal, State, Local not specified - PPO | Source: Ambulatory Visit

## 2017-10-27 DIAGNOSIS — Z170 Estrogen receptor positive status [ER+]: Secondary | ICD-10-CM

## 2017-10-27 DIAGNOSIS — C50811 Malignant neoplasm of overlapping sites of right female breast: Secondary | ICD-10-CM

## 2017-10-27 DIAGNOSIS — R922 Inconclusive mammogram: Secondary | ICD-10-CM | POA: Diagnosis not present

## 2017-10-27 HISTORY — DX: Personal history of irradiation: Z92.3

## 2017-10-27 HISTORY — DX: Personal history of antineoplastic chemotherapy: Z92.21

## 2017-11-14 ENCOUNTER — Ambulatory Visit: Payer: Federal, State, Local not specified - PPO | Attending: Oncology | Admitting: Physical Therapy

## 2017-11-14 ENCOUNTER — Encounter: Payer: Self-pay | Admitting: Physical Therapy

## 2017-11-14 ENCOUNTER — Other Ambulatory Visit: Payer: Self-pay

## 2017-11-14 DIAGNOSIS — M6281 Muscle weakness (generalized): Secondary | ICD-10-CM | POA: Diagnosis not present

## 2017-11-14 NOTE — Therapy (Addendum)
[facility] [address] [place], [place], [address] Phone: [phone]   Fax:  [phone]  Physical Therapy Evaluation  Patient Details  Name: Nichole [name] MRN: [id number] Date of Birth: 04-Dec-1950 Referring Provider: Dr. Lurline Del   Encounter Date: 11/14/2017  PT End of Session - 11/14/17 1523    Visit Number  1    PT Start Time  8921    PT Stop Time  1523    PT Time Calculation (min)  38 min    Activity Tolerance  Patient tolerated treatment well    Behavior During Therapy  [facility] for tasks assessed/performed       Past Medical History:  Diagnosis Date  . Breast cancer ([place])   . Cancer [facility]) december 2012   right breast  . History of breast cancer 07/2011   right  . History of chemotherapy 11/2011  . Hyperlipidemia    no current med.  . Personal history of chemotherapy    2012  . Personal history of radiation therapy    2012  . SVD (spontaneous vaginal delivery)    x 3    Past Surgical History:  Procedure Laterality Date  . BREAST LUMPECTOMY Right 08/2011  . BREAST LUMPECTOMY Right   . CARPAL TUNNEL RELEASE Left 06/19/2013   Procedure: CARPAL TUNNEL RELEASE;  Surgeon: Cammie Sickle., MD;  Location: [facility];  Service: Orthopedics;  Laterality: Left;  . CESAREAN SECTION     x 1  . COLON SURGERY     Hx polyps  . LAPAROSCOPIC SALPINGO OOPHERECTOMY Bilateral 04/13/2016   Procedure: LAPAROSCOPIC SALPINGO OOPHORECTOMY;  Surgeon: Terrance Mass, MD;  Location: [facility];  Service: Gynecology;  Laterality: Bilateral;  . PORT-A-CATH REMOVAL  06/2012  . PORTACATH PLACEMENT  2013  . SENTINEL LYMPH NODE BIOPSY Right 08/2011  . WISDOM TOOTH EXTRACTION      There were no vitals filed for this visit.   Subjective Assessment - 11/14/17 1448    Subjective  Patient is having vaginal dryness. Patient is dry vaginally.  When patient finishs with intercourse the area is tearing.  The  vaginal area is thin. when wipes with toilet paper there is pain.     Patient Stated Goals  how to manage vaginal dryness    Currently in Pain?  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 Sarajane Jews Magrinat    Onset Date/Surgical Date  08/13/14    Prior Therapy  None      Precautions   Precautions  Other (comment)    Precaution Comments  breast cancer      Restrictions   Weight Bearing Restrictions  No      Balance Screen   Has the patient fallen in the past 6 months  No    Has the patient had a decrease in activity level because of a fear of falling?   No    Is the patient reluctant to leave their home because of a fear of falling?   No      Home Film/video editor residence      Prior Function   Level of Independence  Independent      Cognition   Overall  Cognitive Status  Within Functional Limits for tasks assessed      Observation/Other Assessments   Focus on Therapeutic Outcomes (FOTO)   no limitations      Posture/Postural Control   Posture/Postural Control  No significant limitations      ROM / Strength   AROM / PROM / Strength  AROM;PROM;Strength      AROM   Overall AROM   Within functional limits for tasks performed      PROM   Overall PROM   Within functional limits for tasks performed      Strength   Overall Strength  Within functional limits for tasks performed             Objective measurements completed on examination: See above findings.    Pelvic Floor Special Questions - 11/14/17 0001    Currently Sexually Active  Yes    Is this Painful  Yes    Urinary Leakage  Yes    Pad use  1    Activities that cause leaking  Coughing;Laughing;With strong urge    Exam Type  Deferred               PT Education - 11/14/17 1519     Education provided  Yes    Education Details  information on vaginal moisturizers, lubricants, pelvic floor exercise    Person(s) Educated  Patient    Methods  Explanation;Demonstration;Verbal cues;Handout    Comprehension  Returned demonstration;Verbalized understanding          PT Long Term Goals - 11/14/17 1529      PT LONG TERM GOAL #1   Title  Patient is able to return demonstration of performing a pelvic floor contraction for exercise to reduce urinary leakage    Time  1    Period  Days    Status  Achieved      PT LONG TERM GOAL #2   Title  understand how to use vaginal moisturizer to improve vaginal health    Time  1    Period  Days    Status  Achieved      PT LONG TERM GOAL #3   Title  understand what vaginal lubricants will be helpful with intercourse to reduce friction with intercourse    Time  1    Period  Days    Status  Achieved             Plan - 11/14/17 1523    Clinical Impression Statement  Patient is a 67 year old female s/p right breast cancer estrogen positive on 08/2011.  Patient is taken Anastrozole 08/13/2014.  Patient had a Partial hysterectomy on 04/13/2016.  Patient reports vaginal dryness.  Patient reports after intercourse she has pain in the vulva area due to tearing with thrusting motion.  Patient reports discomfort in the perineal area when she has to wipe herself due to the dryness.  Patient reports urinary leakage with laughing and rushing to the bathroom.  Patient will use 1 pad per day.  Patient was educated on how to use lubricants and moisturizers to improve vaginal health.  Patient was educated on pelvic floor contraction exercise and prior to her laughing to reduce urinary leakage.      History and Personal Factors relevant to plan of care:  Partial hysterectomy 04/13/2016; Right breast cancer 08/2011; Anastrozole since 08/13/2014    Clinical Presentation  Stable    Clinical Presentation due to:  stable condition    Clinical Decision  Making   Low    Rehab Potential  Excellent    Clinical Impairments Affecting Rehab Potential  Partial hysterectomy 04/13/2016; Right breast cancer 08/2011; Anastrozole since 08/13/2014    PT Frequency  One time visit    PT Duration  -- one time with eval and HEP    PT Treatment/Interventions  Patient/family education;Therapeutic exercise    PT Next Visit Plan  Discharge to HEP. Patient will only attend the initial eval and received HEP.  1 time visit    PT Home Exercise Plan  Current HEP    Consulted and Agree with Plan of Care  Patient       Patient will benefit from skilled therapeutic intervention in order to improve the following deficits and impairments:  Decreased strength  Visit Diagnosis: Muscle weakness (generalized) - Plan: PT plan of care cert/re-cert     Problem List Patient Active Problem List   Diagnosis Date Noted  . PCP NOTES>>>>>>>>>>>>>>>>>> 11/21/2016  . Annual physical exam 11/19/2016  . Malignant neoplasm of overlapping sites of right breast in female, estrogen receptor positive ([place]) 11/15/2016  . Osteopenia 07/16/2015  . Other malaise and fatigue 05/24/2014  . Hypersomnolence 05/24/2014  . Chest pain, unspecified 05/24/2014  . Hot flashes due to tamoxifen 07/24/2013  . Dense breasts 07/24/2013  . Increased endometrial stripe thickness 07/18/2013  . Hyperlipidemia   . Family history of colon cancer 05/23/2012    Earlie Counts, PT 11/14/17 3:33 PM   [facility] [address] [place], [place], [address] Phone: [phone]   Fax:  [phone]  Name: Nichole [name] MRN: [id number] Date of Birth: 1950-12-01

## 2017-11-14 NOTE — Patient Instructions (Addendum)
Lubrication . Used for intercourse to reduce friction . Avoid ones that have glycerin, warming gels, tingling gels, icing or cooling gel, scented . Avoid parabens due to a preservative similar to female sex hormone . May need to be reapplied once or several times during sexual activity . Can be applied to both partners genitals prior to vaginal penetration to minimize friction or irritation . Prevent irritation and mucosal tears that cause post coital pain and increased the risk of vaginal and urinary tract infections . Oil-based lubricants cannot be used with condoms due to breaking them down.  Least likely to irritate vaginal tissue.  . Plant based-lubes are safe . Silicone-based lubrication are thicker and last long and used for post-menopausal women  Vaginal Lubricators Here is a list of some suggested lubricators you can use for intercourse. Use the most hypoallergenic product.  You can place on you or your partner.   Slippery Stuff  Sylk or Sliquid Natural H2O ( good  if frequent UTI's)  Blossom Organics (www.blossom-organics.com)  Luvena   Coconut oil  PJur Woman Nude- water based lubricant, amazon  Uberlube- Amazon  Aloe Vera  Yes lubricant- [employer] Platinum-Silicone, Target, [facility]  Olive and Bee intimate cream-  www.oliveandbee.com.au Things to avoid in lubricants are glycerin, warming gels, tingling gels, icing or cooling  gels, and scented gels.  Also avoid Vaseline. KY jelly, Replens, and Astroglide kills good bacteria(lactobacilli)  Things to avoid in the vaginal area . Do not use things to irritate the vulvar area . No lotions- see below . No soaps; can use Aveeno or Calendula cleanser if needed. Must be gentle . No deodorants . No douches . Good to sleep without underwear to let the vaginal area to air out . No scrubbing: spread the lips to let warm water rinse over labias and pat dry  Creams that can be used on the [place] Releveum or [place] Gele    Slow Contraction: Gravity Resisted (Sitting)    Sitting, slowly squeeze pelvic floor for __5_ seconds. Rest for _5__ seconds. Repeat _5__ times. Do _4__ times a day. Make sure you breath with exercise.  Copyright  VHI. All rights reserved.  Moisturizers . They are used in the vagina to hydrate the mucous membrane that make up the vaginal canal. . Designed to keep a more normal acid balance (ph) . Once placed in the vagina, it will last between two to three days.  . Use 2-3 times per week at bedtime and last longer than 60 min. . Ingredients to avoid is glycerin and fragrance, can increase chance of infection . Should not be used just before sex due to causing irritation . Most are gels administered either in a tampon-shaped applicator or as a vaginal suppository. They are non-hormonal.   Types of Moisturizers . Samul Dada- drug store . Vitamin E vaginal suppositories- Whole foods, Amazon . Moist Again . Coconut oil- can break down condoms . Michail Jewels . Yes moisturizer- amazon . NeuEve Silk , NeuEve Silver for menopausal or over 65 (if have severe vaginal atrophy or cancer treatments use NeuEve Silk for  1 month than move to The Pepsi)- [employer], MapleFlower.dk . Olive and Bee intimate cream- www.oliveandbee.com.au  Creams to use externally on the Vulva area  Albertson's (good for for cancer patients that had radiation to the area)- [place] or  chrisdegray.com  V-magic cream - amazon  Julva-amazon  Vital "V Wild Yam salve ( help moisturize and help with thinning vulvar area, does have [place]   Things to avoid in the vaginal area . Do not use things to irritate the vulvar area . No lotions just specialized creams for the vulva area-  Neogyn, V-magic, No soaps; can use Aveeno or Calendula cleanser if needed. Must be gentle . No deodorants . No douches . Good to sleep without underwear to let the vaginal area to air out . No scrubbing: spread the lips to let warm water rinse over labias and pat dry  [facility] [address], [place], [place] [address] Phone # [phone] Fax [phone]

## 2017-12-05 ENCOUNTER — Ambulatory Visit: Payer: Federal, State, Local not specified - PPO | Admitting: Internal Medicine

## 2017-12-05 ENCOUNTER — Encounter: Payer: Self-pay | Admitting: Internal Medicine

## 2017-12-05 VITALS — BP 128/78 | HR 76 | Temp 97.7°F | Resp 14 | Ht <= 58 in | Wt 172.4 lb

## 2017-12-05 DIAGNOSIS — R2231 Localized swelling, mass and lump, right upper limb: Secondary | ICD-10-CM | POA: Diagnosis not present

## 2017-12-05 DIAGNOSIS — Z0000 Encounter for general adult medical examination without abnormal findings: Secondary | ICD-10-CM

## 2017-12-05 DIAGNOSIS — Z170 Estrogen receptor positive status [ER+]: Secondary | ICD-10-CM

## 2017-12-05 DIAGNOSIS — B001 Herpesviral vesicular dermatitis: Secondary | ICD-10-CM | POA: Diagnosis not present

## 2017-12-05 DIAGNOSIS — Z23 Encounter for immunization: Secondary | ICD-10-CM | POA: Diagnosis not present

## 2017-12-05 DIAGNOSIS — E782 Mixed hyperlipidemia: Secondary | ICD-10-CM

## 2017-12-05 DIAGNOSIS — C50811 Malignant neoplasm of overlapping sites of right female breast: Secondary | ICD-10-CM | POA: Diagnosis not present

## 2017-12-05 LAB — LIPID PANEL
CHOL/HDL RATIO: 4
CHOLESTEROL: 174 mg/dL (ref 0–200)
HDL: 46.6 mg/dL (ref >39.00)
LDL Cholesterol: 94 mg/dL (ref 0–99)
NonHDL: 127.56
Triglycerides: 168 mg/dL — ABNORMAL HIGH (ref 0.0–149.0)
VLDL: 33.6 mg/dL (ref 0.0–40.0)

## 2017-12-05 LAB — ALT: ALT: 22 U/L (ref 0–35)

## 2017-12-05 LAB — AST: AST: 23 U/L (ref 0–37)

## 2017-12-05 MED ORDER — VALACYCLOVIR HCL 1 G PO TABS: 1000.0000 mg | ORAL_TABLET | Freq: Two times a day (BID) | ORAL | 1 refills | Status: DC

## 2017-12-05 MED ORDER — VALACYCLOVIR HCL 1 G PO TABS: 2000.0000 mg | ORAL_TABLET | Freq: Two times a day (BID) | ORAL | 2 refills | Status: DC

## 2017-12-05 NOTE — Patient Instructions (Signed)
GO TO THE LAB : Get the blood work     GO TO THE FRONT DESK Schedule your next appointment for a physical exam in 6 months   Valtrex 1000 mg: 2 tablet twice a day for 1 day for each episode of herpes.   Cold Sore A cold sore, also called a fever blister, is a skin infection that is caused by a virus. This infection causes small, fluid-filled sores to form inside of the mouth or on the lips, gums, nose, chin, or cheeks. Cold sores can spread to other parts of the body, such as the eyes or fingers. Cold sores can be spread or passed from person to person (contagious) until the sores crust over completely. Cold sores can be spread through close contact, such as kissing or sharing a drinking glass. Follow these instructions at home: Medicines  Take or apply over-the-counter and prescription medicines only as told by your doctor.  Use a cotton-tip swab to apply creams or gels to your sores. Sore Care  Do not touch the sores or pick the scabs.  Wash your hands often. Do not touch your eyes without washing your hands first.  Keep the sores clean and dry.  If directed, apply ice to the sores:  Put ice in a plastic bag.  Place a towel between your skin and the bag.  Leave the ice on for 20 minutes, 2-3 times per day. Lifestyle  Do not kiss, have oral sex, or share personal items until your sores heal.  Eat a soft, bland diet. Avoid eating hot, cold, or salty foods. These can hurt your mouth.  Use a straw if it hurts to drink out of a glass.  Avoid the sun and limit your stress if these things trigger outbreaks. If sun causes cold sores, apply sunscreen on your lips before being out in the sun. Contact a doctor if:  You have symptoms for more than two weeks.  You have pus coming from the sores.  You have redness that is spreading.  You have pain or irritation in your eye.  You get sores on your genitals.  Your sores do not heal within two weeks.  You get cold sores  often. Get help right away if:  You have a fever and your symptoms suddenly get worse.  You have a headache and confusion. This information is not intended to replace advice given to you by your health care provider. Make sure you discuss any questions you have with your health care provider. Document Released: 02/29/2012 Document Revised: 02/05/2016 Document Reviewed: 06/20/2015 Elsevier Interactive Patient Education  [employer].

## 2017-12-05 NOTE — Progress Notes (Signed)
Pre visit review using our clinic review tool, if applicable. No additional management support is needed unless otherwise documented below in the visit note. 

## 2017-12-05 NOTE — Progress Notes (Signed)
Subjective:    Patient ID: Nichole [name], female    DOB: [birth year], 67 y.o.   MRN: [id number]  DOS:  12/05/2017 Type of visit - description : rov Interval history: In general feeling well. 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  Blisters are usually preceded by some discomfort at the area. Breast cancer: Anastrozole discontinued a few months ago after she completed the treatment.   Review of Systems At some point complained of chronic sore throat, that is largely resolved.  Past Medical History:  Diagnosis Date  . Breast cancer ([place])   . Cancer [facility]) december 2012   right breast  . History of breast cancer 07/2011   right  . History of chemotherapy 11/2011  . Hyperlipidemia    no current med.  . Personal history of chemotherapy    2012  . Personal history of radiation therapy    2012  . SVD (spontaneous vaginal delivery)    x 3    Past Surgical History:  Procedure Laterality Date  . BREAST LUMPECTOMY Right 08/2011  . BREAST LUMPECTOMY Right   . CARPAL TUNNEL RELEASE Left 06/19/2013   Procedure: CARPAL TUNNEL RELEASE;  Surgeon: Cammie Sickle., MD;  Location: [facility];  Service: Orthopedics;  Laterality: Left;  . CESAREAN SECTION     x 1  . COLON SURGERY     Hx polyps  . LAPAROSCOPIC SALPINGO OOPHERECTOMY Bilateral 04/13/2016   Procedure: LAPAROSCOPIC SALPINGO OOPHORECTOMY;  Surgeon: Terrance Mass, MD;  Location: [facility];  Service: Gynecology;  Laterality: Bilateral;  . PORT-A-CATH REMOVAL  06/2012  . PORTACATH PLACEMENT  2013  . SENTINEL LYMPH NODE BIOPSY Right 08/2011  . WISDOM TOOTH EXTRACTION      Social History   Socioeconomic History  . Marital status: Married    Spouse name: Not on file  . Number of children: 4  . Years of education: Not on file  . Highest education  level: Not on file  Occupational History  . Occupation: retired from the [employer]  . Financial resource strain: Not on file  . Food insecurity:    Worry: Not on file    Inability: Not on file  . Transportation needs:    Medical: Not on file    Non-medical: Not on file  Tobacco Use  . Smoking status: Former Smoker    Years: 3.00    Types: Cigarettes    Last attempt to quit: 09/13/2001    Years since quitting: 16.2  . Smokeless tobacco: Never Used  . Tobacco comment: social-light smoker  Substance and Sexual Activity  . Alcohol use: No  . Drug use: No  . Sexual activity: Yes    Partners: Male    Birth control/protection: Post-menopausal    Comment: 1st intercourse- 16, partners- 86, married- 9 yrs   Lifestyle  . Physical activity:    Days per week: Not on file    Minutes per session: Not on file  . Stress: Not on file  Relationships  . Social connections:    Talks on phone: Not on file    Gets together: Not on file    Attends religious service: Not on file    Active member of club or organization: Not on file    Attends meetings of clubs or organizations: Not on  file    Relationship status: Not on file  . Intimate partner violence:    Fear of current or ex partner: Not on file    Emotionally abused: Not on file    Physically abused: Not on file    Forced sexual activity: Not on file  Other Topics Concern  . Not on file  Social History Narrative   Original from [place]   Lives w/ husband, travels frequently      Allergies as of 12/05/2017      Reactions   Penicillins Rash   Has patient had a PCN reaction causing immediate rash, facial/tongue/throat swelling, SOB or lightheadedness with hypotension: No Has patient had a PCN reaction causing severe rash involving mucus membranes or skin necrosis: No Has patient had a PCN reaction that required hospitalization No Has patient had a PCN reaction occurring within the last 10 years: Yes If all of the  above answers are "NO", then may proceed with Cephalosporin use.   Poractant Alfa Rash   Pork-derived Products Rash      Medication List        Accurate as of 12/05/17  1:38 PM. Always use your most recent med list.          CALTRATE 600 PO Take 1 tablet by mouth daily.   omeprazole 20 MG tablet Commonly known as:  PRILOSEC OTC Take 20 mg by mouth daily.   rosuvastatin 5 MG tablet Commonly known as:  CRESTOR TAKE 1 TABLET (5 MG TOTAL) BY MOUTH DAILY.   VITAMIN C PO Take by mouth.   VITAMIN D PO Take 1 tablet by mouth daily.          Objective:   Physical Exam  HENT:  Mouth/Throat:     BP 128/78 (BP Location: Left Arm, Patient Position: Sitting, Cuff Size: Normal)   Pulse 76   Temp 97.7 F (36.5 C) (Oral)   Resp 14   Ht 4\' 10"  (1.473 m)   Wt 172 lb 6 oz (78.2 kg)   SpO2 97%   BMI 36.03 kg/m  General:   Well developed, well nourished . NAD.  HEENT:  Normocephalic . Face symmetric, atraumatic Lungs:  CTA B Normal respiratory effort, no intercostal retractions, no accessory muscle use. Heart: RRR,  no murmur.  No pretibial edema bilaterally  Skin: Not pale. Not jaundice Neurologic:  alert & oriented X3.  Speech normal, gait appropriate for age and unassisted Psych--  Cognition and judgment appear intact.  Cooperative with normal attention span and concentration.  Behavior appropriate. No anxious or depressed appearing.      Assessment & Plan:   Assessment  (New patient 11-2016) Hyperlipidemia Breast cancer R, 2012, lumpectomy, chemotherapy, s/p Port-A-Cath; d/c anastrazole 2018; d/c from oncology, f/u w/ PCP, will need tearly MMG and clinical breast exam Lipoma, left abdomen.  Plan:  Hyperlipidemia: On Crestor, check AST, ALT, FLP. Breast cancer: She finished anastrozole few months ago. Hem-onc note reviewed: d/c from oncology, f/u w/ PCP, will need tearly MMG and clinical breast exam.  Next mammogram 10-2018. Subcutaneous mass: Saw surgery,  was scheduled for surgery but did not proceed.  If she is interested, recommend to call surgery. Sleep pattern disturbance: see last OV. Somewhat better now that she stopped anastrozole Osteopenia: T score -1.5 on December 2017, next around December 2019. Recommend   vitamin D, stay active  herpes labialis: new issue, RX Valtrex 2000 mg twice a day times 1 day for each episode.  She is aware  she is contagious. RTC CPX 6 months

## 2017-12-06 DIAGNOSIS — B001 Herpesviral vesicular dermatitis: Secondary | ICD-10-CM | POA: Insufficient documentation

## 2017-12-06 NOTE — Assessment & Plan Note (Signed)
Hyperlipidemia: On Crestor, check AST, ALT, FLP. Breast cancer: She finished anastrozole few months ago. Hem-onc note reviewed: d/c from oncology, f/u w/ PCP, will need tearly MMG and clinical breast exam.  Next mammogram 10-2018. Subcutaneous mass: Saw surgery, was scheduled for surgery but did not proceed.  If she is interested, recommend to call surgery. Sleep pattern disturbance: see last OV. Somewhat better now that she stopped anastrozole Osteopenia: T score -1.5 on December 2017, next around December 2019. Recommend   vitamin D, stay active  herpes labialis: new issue, RX Valtrex 2000 mg twice a day times 1 day for each episode.  She is aware she is contagious. RTC CPX 6 months

## 2017-12-06 NOTE — Assessment & Plan Note (Signed)
-  PNM 23 today. note  from GI: Had a colonoscopy 2010 in [place], had a flat plaque @ rectum. Had a colonoscopy 2013, next Colonoscopy 2023 unless something changes clinically. Reports was scanned

## 2018-04-29 ENCOUNTER — Encounter: Payer: Self-pay | Admitting: Internal Medicine

## 2018-04-29 ENCOUNTER — Encounter: Payer: Self-pay | Admitting: Oncology

## 2018-05-02 ENCOUNTER — Telehealth: Payer: Self-pay | Admitting: Oncology

## 2018-05-02 MED ORDER — ROSUVASTATIN CALCIUM 5 MG PO TABS: 5.0000 mg | ORAL_TABLET | Freq: Every day | ORAL | 0 refills | Status: DC

## 2018-05-02 NOTE — Telephone Encounter (Signed)
Per 8/20 sch msg call about 8/26

## 2018-05-08 ENCOUNTER — Ambulatory Visit (HOSPITAL_COMMUNITY)
Admission: RE | Admit: 2018-05-08 | Discharge: 2018-05-08 | Disposition: A | Discharge status: Home / Self Care | Payer: Federal, State, Local not specified - PPO | Source: Ambulatory Visit | Attending: Adult Health | Admitting: Adult Health

## 2018-05-08 ENCOUNTER — Inpatient Hospital Stay: Payer: Federal, State, Local not specified - PPO | Attending: Adult Health | Admitting: Adult Health

## 2018-05-08 VITALS — BP 116/87 | HR 86 | Temp 98.1°F | Resp 17 | Ht <= 58 in | Wt 170.1 lb

## 2018-05-08 DIAGNOSIS — C50811 Malignant neoplasm of overlapping sites of right female breast: Secondary | ICD-10-CM | POA: Diagnosis not present

## 2018-05-08 DIAGNOSIS — Z79899 Other long term (current) drug therapy: Secondary | ICD-10-CM | POA: Diagnosis not present

## 2018-05-08 DIAGNOSIS — R059 Cough, unspecified: Secondary | ICD-10-CM

## 2018-05-08 DIAGNOSIS — F17210 Nicotine dependence, cigarettes, uncomplicated: Secondary | ICD-10-CM

## 2018-05-08 DIAGNOSIS — N644 Mastodynia: Secondary | ICD-10-CM

## 2018-05-08 DIAGNOSIS — Z923 Personal history of irradiation: Secondary | ICD-10-CM

## 2018-05-08 DIAGNOSIS — Z170 Estrogen receptor positive status [ER+]: Secondary | ICD-10-CM | POA: Diagnosis not present

## 2018-05-08 DIAGNOSIS — R05 Cough: Secondary | ICD-10-CM

## 2018-05-08 DIAGNOSIS — Z79811 Long term (current) use of aromatase inhibitors: Secondary | ICD-10-CM

## 2018-05-08 DIAGNOSIS — F17200 Nicotine dependence, unspecified, uncomplicated: Secondary | ICD-10-CM | POA: Diagnosis not present

## 2018-05-08 NOTE — Progress Notes (Signed)
ID: Nichole [name]   DOB: 02/16/1951  MR#: [id number]  CSN#:[id number]  Patient Care Team: Colon Branch, MD as PCP - General (Internal Medicine) Magrinat, Virgie Dad, MD (Hematology and Oncology) Carol Ada, MD (Gastroenterology) Princess Bruins, MD as Consulting Physician (Obstetrics and Gynecology) Ralene Ok, MD as Consulting Physician (General Surgery)  CHIEF COMPLAINT:  Estrogen receptor positive Breast Cancer  CURRENT TREATMENT: Completing 5 years of anastrozole  HISTORY OF PRESENT ILLNESS: From the original intake note:  The patient herself noted a mass in her right breast and brought it to her physician's attention. She was living in [place] at that time. An ultrasound was done, showing a 1.7 cm spiculated nodule in the right breast with associated calcifications. There were no other findings of concern.  Lumpectomy was performed, with reportedly negative margins (I do not have those records). In Dr. Rosendo Gros note 11/04/2011 this tumor is described as triple positive.  The patient moved to [facility] for further treatment, and sentinel lymph node sampling in December of 2012 was negative. The original tissue obtained in [place] was reviewed, showing a 1.6 cm, grade 3 invasive ductal carcinoma. and the prognostic panel obtained in [place] showed the tumor to be estrogen receptor positive, progesterone receptor negative, and HER-2 negative in the invasive tumor (it was positive in the in situ tumor; SCT 12-30 at [facility]). (A note from that report states that HER-2 by [facility] was performed at [facility] and was reportedly positive in the invasive carcinoma. That report is not available to me today.)  The patient completed adjuvant radiation and was reassessed with an [facility] panel, which showed the tumor to be HER-2 negative, with an MIB-1 of 42%, again estrogen receptor positive and progesterone receptor negative. This panel predicted a 43%  risk of recurrence at 8 years. The patient also had an Oncotype DX sent. This was again ER positive, PR negative, and HER-2 negative, and predicted a 34% risk of distant recurrence at 10 years if the patient's only adjuvant systemic treatment was tamoxifen for 5 years. With these numbers Dr. Curlene Labrum discussed adjuvant chemotherapy with the patient, and proposed carboplatin, docetaxel, and Herceptin, which was started on February 2013. On a note from 11/12/2011, Dr. Curlene Labrum states "even though the HER-2-neu is negative, I am still opting for Herceptin based on the aggressiveness of the tumor." The chemotherapy was completed in June 2013. Herceptin was continued, the plan being to complete one year.   The patient's subsequent history is as detailed below.  INTERVAL HISTORY: Nichole [name] returns today for an urgent evaluation of a breast concern.  She has noted a new pain in her right breast at her nipple.  Sometimes she feels like her right nipple is larger than the left.  She also notes a pulling sensation at the lumpectomy site.  She says this issue has been intermittent and is new over the past year.  Her last mammogram 10/2017 and an intermittent burning was noted at that time.  They recommended clinical follow up.    REVIEW OF SYSTEMS  Nichole [name] is doing well for the most part otherwise.  She has noted a NP cough x 2 months that just wont seem to go away.  She noted it with chest congestion.  She denies fevers, chills, chest pain, pleurisy, wheezing, shortness of breath, palpitations, fatigue, nausea, vomiting, constipation, or any other concerns.  A detailed ROS is otherwise non contributory.    PAST MEDICAL HISTORY: Past Medical History:  Diagnosis Date  .  Breast cancer ([place])   . Cancer [facility]) december 2012   right breast  . History of breast cancer 07/2011   right  . History of chemotherapy 11/2011  . Hyperlipidemia    no current med.  . Personal history of chemotherapy    2012  . Personal history of  radiation therapy    2012  . SVD (spontaneous vaginal delivery)    x 3    PAST SURGICAL HISTORY: Past Surgical History:  Procedure Laterality Date  . BREAST LUMPECTOMY Right 08/2011  . BREAST LUMPECTOMY Right   . CARPAL TUNNEL RELEASE Left 06/19/2013   Procedure: CARPAL TUNNEL RELEASE;  Surgeon: Cammie Sickle., MD;  Location: [place];  Service: Orthopedics;  Laterality: Left;  . CESAREAN SECTION     x 1  . COLON SURGERY     Hx polyps  . LAPAROSCOPIC SALPINGO OOPHERECTOMY Bilateral 04/13/2016   Procedure: LAPAROSCOPIC SALPINGO OOPHORECTOMY;  Surgeon: Terrance Mass, MD;  Location: [facility];  Service: Gynecology;  Laterality: Bilateral;  . PORT-A-CATH REMOVAL  06/2012  . PORTACATH PLACEMENT  2013  . SENTINEL LYMPH NODE BIOPSY Right 08/2011  . WISDOM TOOTH EXTRACTION      FAMILY HISTORY Family History  Problem Relation Age of Onset  . Colon cancer Mother        dx postpartum  . Congestive Heart Failure Mother   . Alzheimer's disease Mother   . Diabetes Father   . Breast cancer Maternal Aunt   . Breast cancer Cousin 71  . Diabetes Paternal Grandmother   . Breast cancer Maternal Aunt   . AAA (abdominal aortic aneurysm) Neg Hx    The patient's father died at the age of 25. The patient's mother died at the age of 19 after a fall, in the setting of Alzheimer's disease. The patient has no siblings. There are some second-degree relatives with breast cancer. She has been tested for the BRCA 1 and 2 mutations, and was found to be negative.  GYNECOLOGIC HISTORY: Menarche age 33. First live birth age 32. She is GX P4. Last menstrual period 2006. Status post hormone replacement, discontinued November 2012.  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 Her husband, Lennette [name], recently retired from the [employer]. This is a second marriage for both of them. Nichole [name]'s 4 children are  [name], 41, who lives in [place] and works as a Cabin crew; [name], who works as a Radio broadcast assistant for an Engineer, production firm in [place]; [name], who works as an Chief Financial Officer in [place]; and the youngest, [name], 28, who works as a Scientist, clinical (histocompatibility and immunogenetics) in [place]. The patient has 4 grandchildren and one on the way. She is a Nurse, learning disability.   ADVANCED DIRECTIVES: not in place  HEALTH MAINTENANCE:  (Updated November 2014) Social History   Tobacco Use  . Smoking status: Former Smoker    Years: 3.00    Types: Cigarettes    Last attempt to quit: 09/13/2001    Years since quitting: 16.6  . Smokeless tobacco: Never Used  . Tobacco comment: social-light smoker  Substance Use Topics  . Alcohol use: No  . Drug use: No     Colonoscopy: Oct 2013/Dr. Hung (Repeat in 2018)  PAP:  Oct 2014, Dr. Toney Rakes  Bone density:  Lipid panel:  Nov 2014, elevated    Allergies  Allergen Reactions  . Penicillins Rash    Has  patient had a PCN reaction causing immediate rash, facial/tongue/throat swelling, SOB or lightheadedness with hypotension: No Has patient had a PCN reaction causing severe rash involving mucus membranes or skin necrosis: No Has patient had a PCN reaction that required hospitalization No Has patient had a PCN reaction occurring within the last 10 years: Yes If all of the above answers are "NO", then may proceed with Cephalosporin use.   . Poractant Alfa Rash  . Pork-Derived Products Rash    Current Outpatient Medications  Medication Sig Dispense Refill  . CALCIUM-VITAMIN D PO Take by mouth daily.    . rosuvastatin (CRESTOR) 5 MG tablet Take 1 tablet (5 mg total) by mouth daily. 90 tablet 0  . valACYclovir (VALTREX) 1000 MG tablet Take 2 tablets (2,000 mg total) by mouth 2 (two) times daily. For each episode, take 2 tablets twice a day for 1 day (Patient taking differently: Take 2,000 mg by mouth 2 (two) times daily. For each episode, take  2 tablets twice a day for 1 day) 30 tablet 2   No current facility-administered medications for this visit.     OBJECTIVE: Vitals:   05/08/18 1305  BP: 116/87  Pulse: 86  Resp: 17  Temp: 98.1 F (36.7 C)  SpO2: 97%     Body mass index is 35.55 kg/m.    ECOG FS: 0 Filed Weights   05/08/18 1305  Weight: 170 lb 1.6 oz (77.2 kg)  GENERAL: Patient is a well appearing female in no acute distress HEENT:  Sclerae anicteric.  Oropharynx clear and moist. No ulcerations or evidence of oropharyngeal candidiasis. Neck is supple.  NODES:  No cervical, supraclavicular, or axillary lymphadenopathy palpated.  BREAST EXAM:  Right breast is without any nodules or masses, she notes pain and tenderness to palpation, particularly beneath the areola.   LUNGS:  Clear to auscultation bilaterally.  No wheezes or rhonchi. HEART:  Regular rate and rhythm. No murmur appreciated. ABDOMEN:  Soft, nontender.  Positive, normoactive bowel sounds. No organomegaly palpated. MSK:  No focal spinal tenderness to palpation. Full range of motion bilaterally in the upper extremities. EXTREMITIES:  No peripheral edema.   SKIN:  Clear with no obvious rashes or skin changes. No nail dyscrasia. NEURO:  Nonfocal. Well oriented.  Appropriate affect.    LAB RESULTS: Lab Results  Component Value Date   WBC 7.3 06/20/2017   NEUTROABS 3.8 06/20/2017   HGB 14.7 06/20/2017   HCT 43.5 06/20/2017   MCV 89.1 06/20/2017   PLT 252 06/20/2017      Chemistry      Component Value Date/Time   NA 145 06/20/2017 1355   K 3.9 06/20/2017 1355   CL 103 05/24/2014 1650   CL 110 (H) 01/08/2013 0821   CO2 28 06/20/2017 1355   BUN 12.5 06/20/2017 1355   CREATININE 0.7 06/20/2017 1355      Component Value Date/Time   CALCIUM 10.1 06/20/2017 1355   ALKPHOS 102 06/20/2017 1355   AST 23 12/05/2017 1414   AST 31 06/20/2017 1355   ALT 22 12/05/2017 1414   ALT 33 06/20/2017 1355   BILITOT 0.59 06/20/2017 1355     Lipid Panel      Component Value Date/Time   CHOL 174 12/05/2017 1414   TRIG 168.0 (H) 12/05/2017 1414   HDL 46.60 12/05/2017 1414   CHOLHDL 4 12/05/2017 1414   VLDL 33.6 12/05/2017 1414   LDLCALC 94 12/05/2017 1414      STUDIES: Most recent bilateral diagnostic mammography  with tomography at the [facility] 11/23/2016 found the breast density to be category B. There was no evidence of malignancy.  Also, on 11/15/2016, for evaluation of a nontraumatic posterior right rib cage pain,  right rib films were obtained. These showed no acute findings.  ASSESSMENT: 67 y.o. BRCA negative [place] woman recently moved to [facility], s/p Right lumpectomy December 2012 for a pT1c pN0, stage 1A invasive ductal carcinoma, grade 3, estrogen receptor positive, progesterone receptor negative, with a Ki-67 of 42%  (1) Oncotype DX recurrence score 54% predicting a 10-year risk of distant recurrence of 34% if the only adjuvant systemic treatment is tamoxifen x 5 years  (1) s/p adjuvant radiation completed February 2013  (2) s/p six cycles of carboplatin/ docetaxel/ trastuzumab completed June 2012  (3) started tamoxifen 05/14/2012, discontinued September 2015  (4) started anastrozole 08/13/2014  (a) bone density obtained in a [facility]  08/06/2014 shows a T score of -1.8  (5)  Status post endometrial biopsy in October 2014 for a thickened endometrial lining, pathology benign   (a) repeat transvaginal ultrasound 08/06/2015 found an endometrial stripe measuring 5.5 cm.  PLAN:  Nichole [name] is doing moderately well.  The issue about her right breast, is likely a post surgical discomfort in nature.  However, it is strange that it is new, and worsening.  We will get a right breast diagnostic mammogram and ultrasound to fully evaluate.    Shanaia has also had a cough for a couple of months.  I recommended she take mucinex, and that we get a chest xray today.  If those are both negative, and if the  cough continues, she should f/u with her PCP regarding future direction and care.    Tywana will continue to follow with [place] on an as needed basis.  She knows to call for any questions or concerns.    A total of (30) minutes of face-to-face time was spent with this patient with greater than 50% of that time in counseling and care-coordination.   Nichole Bihari, NP 05/08/18 1:11 PM Medical Oncology and Hematology [facility] [address] [place],  [address] Tel. [phone] Fax. [phone]

## 2018-05-09 ENCOUNTER — Encounter: Payer: Self-pay | Admitting: Adult Health

## 2018-05-09 ENCOUNTER — Telehealth: Payer: Self-pay

## 2018-05-09 NOTE — Telephone Encounter (Signed)
Spoke with patient informing of normal xray results.  Patient voiced understanding and thanks for for call.  Know to call center for any questions/concerns.

## 2018-05-09 NOTE — Telephone Encounter (Signed)
-----   Message from Gardenia Phlegm, NP sent at 05/08/2018  9:22 PM EDT ----- Please let patient know chest xray was normal.  Thanks, [place] ----- Message ----- From: Interface, Rad Results In Sent: 05/08/2018   4:48 PM EDT To: Gardenia Phlegm, NP

## 2018-05-10 ENCOUNTER — Ambulatory Visit
Admission: RE | Admit: 2018-05-10 | Discharge: 2018-05-10 | Disposition: A | Discharge status: Home / Self Care | Payer: Federal, State, Local not specified - PPO | Source: Ambulatory Visit | Attending: Adult Health | Admitting: Adult Health

## 2018-05-10 ENCOUNTER — Telehealth: Payer: Self-pay | Admitting: Adult Health

## 2018-05-10 DIAGNOSIS — Z170 Estrogen receptor positive status [ER+]: Secondary | ICD-10-CM

## 2018-05-10 DIAGNOSIS — N6489 Other specified disorders of breast: Secondary | ICD-10-CM | POA: Diagnosis not present

## 2018-05-10 DIAGNOSIS — N644 Mastodynia: Secondary | ICD-10-CM

## 2018-05-10 DIAGNOSIS — C50811 Malignant neoplasm of overlapping sites of right female breast: Secondary | ICD-10-CM

## 2018-05-10 DIAGNOSIS — R922 Inconclusive mammogram: Secondary | ICD-10-CM | POA: Diagnosis not present

## 2018-05-10 NOTE — Telephone Encounter (Signed)
Per 8/26 no los 

## 2018-05-16 ENCOUNTER — Ambulatory Visit: Payer: Federal, State, Local not specified - PPO | Admitting: Internal Medicine

## 2018-05-16 ENCOUNTER — Encounter: Payer: Self-pay | Admitting: Internal Medicine

## 2018-05-16 VITALS — BP 110/68 | HR 62 | Temp 97.7°F | Resp 16 | Ht <= 58 in | Wt 171.0 lb

## 2018-05-16 DIAGNOSIS — J40 Bronchitis, not specified as acute or chronic: Secondary | ICD-10-CM

## 2018-05-16 MED ORDER — ALBUTEROL SULFATE HFA 108 (90 BASE) MCG/ACT IN AERS: 2.0000 | INHALATION_SPRAY | Freq: Four times a day (QID) | RESPIRATORY_TRACT | 1 refills | Status: DC | PRN

## 2018-05-16 MED ORDER — DOXYCYCLINE HYCLATE 100 MG PO TABS: 100.0000 mg | ORAL_TABLET | Freq: Two times a day (BID) | ORAL | 0 refills | Status: DC

## 2018-05-16 NOTE — Patient Instructions (Signed)
Next visit for a physical exam in 3 to 4 months, fasting  Rest, fluids , tylenol  For cough:  Take Mucinex DM twice a day as needed until better  Use the inhaler 2 puffs every 6 hours as needed for cough, chest congestion, wheezing    Take the antibiotic as prescribed  (doxy)  Call if not gradually better over the next  10 days  Call anytime if the symptoms are severe

## 2018-05-16 NOTE — Assessment & Plan Note (Signed)
Bronchitis: Recommend doxycycline, Mucinex and supportive treatment.  Expiratory time is increased, she has used inhalers before, recommend Ventolin as needed for persistent cough or wheezing.  Call if no better, see AVS RTC 3 to 4 months CPX

## 2018-05-16 NOTE — Progress Notes (Signed)
Subjective:    Patient ID: Nichole [name], female    DOB: 12-20-1950, 67 y.o.   MRN: [id number]  DOS:  05/16/2018 Type of visit - description : acute  Interval history: Patient developed a URI with chest-sinus congestion, sinus facial pain approximately 4 weeks ago when she was in [place]. Was prescribed an antibiotic ( name?) and a cough suppressant. She got somewhat better but 2 weeks ago symptoms return, and this time with cough, chest congestion and yellow sputum production.  Sinuses are not that congested at this point   Review of Systems No fever chills Has some nausea and vomiting with the onset of symptoms but not anymore.  No diarrhea No difficulty breathing. Anterior chest pain occasionally with cough.  Past Medical History:  Diagnosis Date  . Breast cancer ([place])   . Cancer [facility]) december 2012   right breast  . History of breast cancer 07/2011   right  . History of chemotherapy 11/2011  . Hyperlipidemia    no current med.  . Personal history of chemotherapy    2012  . Personal history of radiation therapy    2012  . SVD (spontaneous vaginal delivery)    x 3    Past Surgical History:  Procedure Laterality Date  . BREAST LUMPECTOMY Right 08/2011  . BREAST LUMPECTOMY Right   . CARPAL TUNNEL RELEASE Left 06/19/2013   Procedure: CARPAL TUNNEL RELEASE;  Surgeon: Cammie Sickle., MD;  Location: [place];  Service: Orthopedics;  Laterality: Left;  . CESAREAN SECTION     x 1  . COLON SURGERY     Hx polyps  . LAPAROSCOPIC SALPINGO OOPHERECTOMY Bilateral 04/13/2016   Procedure: LAPAROSCOPIC SALPINGO OOPHORECTOMY;  Surgeon: Terrance Mass, MD;  Location: [facility];  Service: Gynecology;  Laterality: Bilateral;  . PORT-A-CATH REMOVAL  06/2012  . PORTACATH PLACEMENT  2013  . SENTINEL LYMPH NODE BIOPSY Right 08/2011  . WISDOM TOOTH EXTRACTION      Social History   Socioeconomic History  . Marital status: Married    Spouse name: Not on file  .  Number of children: 4  . Years of education: Not on file  . Highest education level: Not on file  Occupational History  . Occupation: retired from the [employer]  . Financial resource strain: Not on file  . Food insecurity:    Worry: Not on file    Inability: Not on file  . Transportation needs:    Medical: Not on file    Non-medical: Not on file  Tobacco Use  . Smoking status: Former Smoker    Years: 3.00    Types: Cigarettes    Last attempt to quit: 09/13/2001    Years since quitting: 16.6  . Smokeless tobacco: Never Used  . Tobacco comment: social-light smoker  Substance and Sexual Activity  . Alcohol use: No  . Drug use: No  . Sexual activity: Yes    Partners: Male    Birth control/protection: Post-menopausal    Comment: 1st intercourse- 16, partners- 14, married- 53 yrs   Lifestyle  . Physical activity:    Days per week: Not on file    Minutes per session: Not on file  . Stress: Not on file  Relationships  . Social connections:    Talks on phone: Not on file    Gets together: Not on file    Attends religious service: Not on file    Active member of club  or organization: Not on file    Attends meetings of clubs or organizations: Not on file    Relationship status: Not on file  . Intimate partner violence:    Fear of current or ex partner: Not on file    Emotionally abused: Not on file    Physically abused: Not on file    Forced sexual activity: Not on file  Other Topics Concern  . Not on file  Social History Narrative   Original from [place]   Lives w/ husband, travels frequently      Allergies as of 05/16/2018      Reactions   Penicillins Rash   Has patient had a PCN reaction causing immediate rash, facial/tongue/throat swelling, SOB or lightheadedness with hypotension: No Has patient had a PCN reaction causing severe rash involving mucus membranes or skin necrosis: No Has patient had a PCN reaction that required hospitalization No Has patient  had a PCN reaction occurring within the last 10 years: Yes If all of the above answers are "NO", then may proceed with Cephalosporin use.   Poractant Alfa Rash   Pork-derived Products Rash      Medication List        Accurate as of 05/16/18 10:34 PM. Always use your most recent med list.          albuterol 108 (90 Base) MCG/ACT inhaler Commonly known as:  PROVENTIL HFA;VENTOLIN HFA Inhale 2 puffs into the lungs every 6 (six) hours as needed for wheezing or shortness of breath.   CALCIUM-VITAMIN D PO Take by mouth daily.   doxycycline 100 MG tablet Commonly known as:  VIBRA-TABS Take 1 tablet (100 mg total) by mouth 2 (two) times daily.   rosuvastatin 5 MG tablet Commonly known as:  CRESTOR Take 1 tablet (5 mg total) by mouth daily.   valACYclovir 1000 MG tablet Commonly known as:  VALTREX Take 2 tablets (2,000 mg total) by mouth 2 (two) times daily. For each episode, take 2 tablets twice a day for 1 day          Objective:   Physical Exam BP 110/68 (BP Location: Left Arm, Patient Position: Sitting, Cuff Size: Small)   Pulse 62   Temp 97.7 F (36.5 C) (Oral)   Resp 16   Ht 4\' 10"  (1.473 m)   Wt 171 lb (77.6 kg)   SpO2 98%   BMI 35.74 kg/m  General:   Well developed, NAD, see BMI.  HEENT:  Normocephalic . Face symmetric, atraumatic.  TMs slightly bulge, not red, no discharge Nose: Not congested.  Sinuses no TTP Lungs:  Rhonchi bilaterally, slightly increased expiratory time, no crackles or distress Normal respiratory effort, no intercostal retractions, no accessory muscle use. Heart: RRR,  no murmur.  No pretibial edema bilaterally  Skin: Not pale. Not jaundice Neurologic:  alert & oriented X3.  Speech normal, gait appropriate for age and unassisted Psych--  Cognition and judgment appear intact.  Cooperative with normal attention span and concentration.  Behavior appropriate. No anxious or depressed appearing.      Assessment & Plan:   Assessment   (New patient 11-2016) Hyperlipidemia Breast cancer R, 2012, lumpectomy, chemotherapy, s/p Port-A-Cath; d/c anastrazole 2018; d/c from oncology, f/u w/ PCP, will need tearly MMG and clinical breast exam Lipoma, left abdomen.  Plan:  Bronchitis: Recommend doxycycline, Mucinex and supportive treatment.  Expiratory time is increased, she has used inhalers before, recommend Ventolin as needed for persistent cough or wheezing.  Call if no better,  see AVS RTC 3 to 4 months CPX

## 2018-07-12 ENCOUNTER — Encounter: Payer: Self-pay | Admitting: Internal Medicine

## 2018-07-12 ENCOUNTER — Ambulatory Visit (INDEPENDENT_AMBULATORY_CARE_PROVIDER_SITE_OTHER): Payer: Federal, State, Local not specified - PPO | Admitting: Internal Medicine

## 2018-07-12 VITALS — BP 124/78 | HR 58 | Temp 98.1°F | Resp 16 | Ht <= 58 in | Wt 169.1 lb

## 2018-07-12 DIAGNOSIS — R351 Nocturia: Secondary | ICD-10-CM

## 2018-07-12 DIAGNOSIS — Z0000 Encounter for general adult medical examination without abnormal findings: Secondary | ICD-10-CM | POA: Diagnosis not present

## 2018-07-12 DIAGNOSIS — R0789 Other chest pain: Secondary | ICD-10-CM

## 2018-07-12 NOTE — Progress Notes (Signed)
Pre visit review using our clinic review tool, if applicable. No additional management support is needed unless otherwise documented below in the visit note. 

## 2018-07-12 NOTE — Progress Notes (Signed)
Subjective:    Patient ID: Nichole [name], female    DOB: 09-27-1950, 67 y.o.   MRN: [id number]  DOS:  07/12/2018 Type of visit - description : cpx Interval history:  Here for CPX, has other concerns  Review of Systems  She reports discomfort/pulling at the right breast for several months, saw oncology, got a diagnostic ultrasound & mammogram.  Results reviewed. Currently, no new findings on self breast exam.  Currently no symptoms. Reports nocturia, 3-4 times every night, no dysuria, gross hematuria difficulty urinating Occasionally hears some wheezing.  Denies GERD symptoms. No shortness of breath ; has  some anterior chest discomfort with certain movements on the right ( chest wall pain).  Other than above, a 14 point review of systems is negative      Past Medical History:  Diagnosis Date  . Breast cancer ([place])   . Cancer [facility]) december 2012   right breast  . History of breast cancer 07/2011   right  . History of chemotherapy 11/2011  . Hyperlipidemia    no current med.  . Personal history of chemotherapy    2012  . Personal history of radiation therapy    2012  . SVD (spontaneous vaginal delivery)    x 3    Past Surgical History:  Procedure Laterality Date  . BREAST LUMPECTOMY Right 08/2011  . BREAST LUMPECTOMY Right   . CARPAL TUNNEL RELEASE Left 06/19/2013   Procedure: CARPAL TUNNEL RELEASE;  Surgeon: Cammie Sickle., MD;  Location: [facility];  Service: Orthopedics;  Laterality: Left;  . CESAREAN SECTION     x 1  . COLON SURGERY     Hx polyps  . LAPAROSCOPIC SALPINGO OOPHERECTOMY Bilateral 04/13/2016   Procedure: LAPAROSCOPIC SALPINGO OOPHORECTOMY;  Surgeon: Terrance Mass, MD;  Location: [facility];  Service: Gynecology;  Laterality: Bilateral;  . PORT-A-CATH REMOVAL  06/2012  . PORTACATH PLACEMENT  2013  . SENTINEL LYMPH NODE BIOPSY Right 08/2011  . WISDOM TOOTH EXTRACTION      Social History   Socioeconomic History  . Marital  status: Married    Spouse name: Not on file  . Number of children: 4  . Years of education: Not on file  . Highest education level: Not on file  Occupational History  . Occupation: retired from the [employer]  . Financial resource strain: Not on file  . Food insecurity:    Worry: Not on file    Inability: Not on file  . Transportation needs:    Medical: Not on file    Non-medical: Not on file  Tobacco Use  . Smoking status: Former Smoker    Years: 3.00    Types: Cigarettes    Last attempt to quit: 09/13/2001    Years since quitting: 16.8  . Smokeless tobacco: Never Used  . Tobacco comment: social-light smoker  Substance and Sexual Activity  . Alcohol use: No  . Drug use: No  . Sexual activity: Yes    Partners: Male    Birth control/protection: Post-menopausal    Comment: 1st intercourse- 16, partners- 74, married- 44 yrs   Lifestyle  . Physical activity:    Days per week: Not on file    Minutes per session: Not on file  . Stress: Not on file  Relationships  . Social connections:    Talks on phone: Not on file    Gets together: Not on file    Attends religious service: Not  on file    Active member of club or organization: Not on file    Attends meetings of clubs or organizations: Not on file    Relationship status: Not on file  . Intimate partner violence:    Fear of current or ex partner: Not on file    Emotionally abused: Not on file    Physically abused: Not on file    Forced sexual activity: Not on file  Other Topics Concern  . Not on file  Social History Narrative   Original from [place]   Lives w/ husband, travels frequently     Family History  Problem Relation Age of Onset  . Colon cancer Mother        dx postpartum  . Congestive Heart Failure Mother   . Alzheimer's disease Mother   . Diabetes Father   . Breast cancer Maternal Aunt   . Breast cancer Cousin 84  . Diabetes Paternal Grandmother   . Breast cancer Maternal Aunt   . AAA  (abdominal aortic aneurysm) Neg Hx   . CAD Neg Hx      Allergies as of 07/12/2018      Reactions   Penicillins Rash   Has patient had a PCN reaction causing immediate rash, facial/tongue/throat swelling, SOB or lightheadedness with hypotension: No Has patient had a PCN reaction causing severe rash involving mucus membranes or skin necrosis: No Has patient had a PCN reaction that required hospitalization No Has patient had a PCN reaction occurring within the last 10 years: Yes If all of the above answers are "NO", then may proceed with Cephalosporin use.   Poractant Alfa Rash   Pork-derived Products Rash      Medication List        Accurate as of 07/12/18 11:59 PM. Always use your most recent med list.          albuterol 108 (90 Base) MCG/ACT inhaler Commonly known as:  PROVENTIL HFA;VENTOLIN HFA Inhale 2 puffs into the lungs every 6 (six) hours as needed for wheezing or shortness of breath.   CALCIUM-VITAMIN D PO Take by mouth daily.   rosuvastatin 5 MG tablet Commonly known as:  CRESTOR Take 1 tablet (5 mg total) by mouth daily.   VALTREX 1000 MG tablet Generic drug:  valACYclovir Take 2 tablets (2,000 mg total) by mouth 2 (two) times daily. For each episode, take 2 tablets twice a day for 1 day          Objective:   Physical Exam BP 124/78 (BP Location: Left Arm, Patient Position: Sitting, Cuff Size: Small)   Pulse (!) 58   Temp 98.1 F (36.7 C) (Oral)   Resp 16   Ht 4\' 10"  (1.473 m)   Wt 169 lb 2 oz (76.7 kg)   SpO2 97%   BMI 35.35 kg/m   General: Well developed, NAD, see BMI.  Neck: No  thyromegaly  HEENT:  Normocephalic . Face symmetric, atraumatic Lungs:  CTA B Normal respiratory effort, no intercostal retractions, no accessory muscle use. Breast and chest wall: Left breast normal Right breast: Changes consistent with previous surgery, XRT tattoos noted.  She has some discomfort to palpation mostly on the outer quadrants, no dominant nodule, no  redness. Chest wall on the right side anteriorly slightly TTP. Heart: RRR,  no murmur.  No pretibial edema bilaterally  Abdomen:  Not distended, soft, non-tender. No rebound or rigidity.   Skin: Exposed areas without rash. Not pale. Not jaundice Neurologic:  alert &  oriented X3.  Speech normal, gait appropriate for age and unassisted Strength symmetric and appropriate for age.  Psych: Cognition and judgment appear intact.  Cooperative with normal attention span and concentration.  Behavior appropriate. No anxious or depressed appearing.     Assessment & Plan:   Assessment  (New patient 11-2016) Hyperlipidemia Breast cancer R, 2012, lumpectomy, chemotherapy, s/p Port-A-Cath; d/c anastrazole 2018; d/c from oncology, f/u w/ PCP, will need yearly MMG and clinical breast exam Lipoma, left abdomen. Reactive airway disease Herpes labialis- valtrex prn  Plan:  High cholesterol: On Crestor, check FLP   Breast cancer: Has developed some discomfort on the right breast, saw oncology, diagnostic mammogram and ultrasound of the right breast were negative; CXR (-).  On exam today there is no obvious abnormalities except some tenderness in the outer quadrants of the right breast. Plan: Continue self breast exam, next routine mammogram should be 10-2018, patient aware.    Reactive airway disease: Occasional wheezing.  Recommend to use albuterol as needed Nocturia: As described above, will check a UA urine culture.  Recommend to discuss with gynecology RTC 1 year

## 2018-07-12 NOTE — Telephone Encounter (Signed)
Error

## 2018-07-12 NOTE — Patient Instructions (Signed)
GO TO THE LAB : Get the blood work     GO TO THE FRONT DESK Schedule your next appointment for a  Physical exam in 1 year    STOP BY THE FIRST FLOOR:  get the XR   Consider [facility]  If you notice any abnormality or increased pain at the right breast, please come back to the office

## 2018-07-12 NOTE — Assessment & Plan Note (Addendum)
--  Td 2014;   Prevnar 2018; pnm 23: 2019; zostavax 2015;  declined flu shot strongly --Female care: s/p B oophorectomy, no hysterectomy, to see gyn 07-2018 --H/o breast CA- last MMG 04/2018: next 10/2018 per report, pt aware  --CCS:  had a colonoscopy 2010 in [place], had a flat plaque @ rectum.   cscope 06-2012, Dr Elbert Ewings, no polyps  OV w/ Dr Elbert Ewings 04/2017, they discussed next cscope pending his review of old reports (OV scanned)  -- labs: CMP, CBC, TSH, A1c, FLP.  UA, urine culture --Diet and exercise discussed

## 2018-07-13 LAB — COMPREHENSIVE METABOLIC PANEL
ALT: 23 U/L (ref 0–35)
AST: 23 U/L (ref 0–37)
Albumin: 4.4 g/dL (ref 3.5–5.2)
Alkaline Phosphatase: 86 U/L (ref 39–117)
BUN: 9 mg/dL (ref 6–23)
CALCIUM: 9.5 mg/dL (ref 8.4–10.5)
CHLORIDE: 104 meq/L (ref 96–112)
CO2: 30 meq/L (ref 19–32)
CREATININE: 0.69 mg/dL (ref 0.40–1.20)
GFR: 89.7 mL/min (ref >60.00)
Glucose, Bld: 88 mg/dL (ref 70–99)
Potassium: 3.9 mEq/L (ref 3.5–5.1)
SODIUM: 142 meq/L (ref 135–145)
Total Bilirubin: 0.8 mg/dL (ref 0.2–1.2)
Total Protein: 6.7 g/dL (ref 6.0–8.3)

## 2018-07-13 LAB — CBC WITH DIFFERENTIAL/PLATELET
BASOS PCT: 0.8 % (ref 0.0–3.0)
Basophils Absolute: 0.1 10*3/uL (ref 0.0–0.1)
EOS ABS: 0.2 10*3/uL (ref 0.0–0.7)
Eosinophils Relative: 2.7 % (ref 0.0–5.0)
HEMATOCRIT: 45.8 % (ref 36.0–46.0)
Hemoglobin: 15.3 g/dL — ABNORMAL HIGH (ref 12.0–15.0)
Lymphocytes Relative: 35.6 % (ref 12.0–46.0)
Lymphs Abs: 3.1 10*3/uL (ref 0.7–4.0)
MCHC: 33.4 g/dL (ref 30.0–36.0)
MCV: 89.7 fl (ref 78.0–100.0)
MONOS PCT: 4.9 % (ref 3.0–12.0)
Monocytes Absolute: 0.4 10*3/uL (ref 0.1–1.0)
NEUTROS ABS: 4.8 10*3/uL (ref 1.4–7.7)
Neutrophils Relative %: 56 % (ref 43.0–77.0)
PLATELETS: 255 10*3/uL (ref 150.0–400.0)
RBC: 5.1 Mil/uL (ref 3.87–5.11)
RDW: 13.7 % (ref 11.5–15.5)
WBC: 8.6 10*3/uL (ref 4.0–10.5)

## 2018-07-13 LAB — LIPID PANEL
Cholesterol: 207 mg/dL — ABNORMAL HIGH (ref 0–200)
HDL: 56.1 mg/dL (ref >39.00)
LDL Cholesterol: 120 mg/dL — ABNORMAL HIGH (ref 0–99)
NONHDL: 151.12
TRIGLYCERIDES: 158 mg/dL — AB (ref 0.0–149.0)
Total CHOL/HDL Ratio: 4
VLDL: 31.6 mg/dL (ref 0.0–40.0)

## 2018-07-13 LAB — URINALYSIS, ROUTINE W REFLEX MICROSCOPIC
Bilirubin Urine: NEGATIVE
KETONES UR: 15 — AB
Nitrite: NEGATIVE
Specific Gravity, Urine: 1.02 (ref 1.000–1.030)
TOTAL PROTEIN, URINE-UPE24: NEGATIVE
URINE GLUCOSE: NEGATIVE
UROBILINOGEN UA: 0.2 (ref 0.0–1.0)
pH: 6 (ref 5.0–8.0)

## 2018-07-13 LAB — URINE CULTURE
MICRO NUMBER: 91306254
SPECIMEN QUALITY:: ADEQUATE

## 2018-07-13 LAB — HEMOGLOBIN A1C: HEMOGLOBIN A1C: 5.7 % (ref 4.6–6.5)

## 2018-07-13 LAB — TSH: TSH: 2.02 u[IU]/mL (ref 0.35–4.50)

## 2018-07-13 NOTE — Assessment & Plan Note (Signed)
High cholesterol: On Crestor, check FLP   Breast cancer: Has developed some discomfort on the right breast, saw oncology, diagnostic mammogram and ultrasound of the right breast were negative; CXR (-).  On exam today there is no obvious abnormalities except some tenderness in the outer quadrants of the right breast. Plan: Continue self breast exam, next routine mammogram should be 10-2018, patient aware.    Reactive airway disease: Occasional wheezing.  Recommend to use albuterol as needed Nocturia: As described above, will check a UA urine culture.  Recommend to discuss with gynecology RTC 1 year

## 2018-08-02 ENCOUNTER — Encounter: Payer: Federal, State, Local not specified - PPO | Admitting: Obstetrics & Gynecology

## 2018-08-07 ENCOUNTER — Ambulatory Visit (INDEPENDENT_AMBULATORY_CARE_PROVIDER_SITE_OTHER): Payer: Federal, State, Local not specified - PPO | Admitting: Obstetrics & Gynecology

## 2018-08-07 ENCOUNTER — Encounter: Payer: Self-pay | Admitting: Obstetrics & Gynecology

## 2018-08-07 VITALS — BP 124/80 | Ht <= 58 in | Wt 168.0 lb

## 2018-08-07 DIAGNOSIS — Z6835 Body mass index (BMI) 35.0-35.9, adult: Secondary | ICD-10-CM

## 2018-08-07 DIAGNOSIS — Z170 Estrogen receptor positive status [ER+]: Secondary | ICD-10-CM

## 2018-08-07 DIAGNOSIS — M8588 Other specified disorders of bone density and structure, other site: Secondary | ICD-10-CM

## 2018-08-07 DIAGNOSIS — Z780 Asymptomatic menopausal state: Secondary | ICD-10-CM | POA: Diagnosis not present

## 2018-08-07 DIAGNOSIS — Z853 Personal history of malignant neoplasm of breast: Secondary | ICD-10-CM

## 2018-08-07 DIAGNOSIS — E6609 Other obesity due to excess calories: Secondary | ICD-10-CM

## 2018-08-07 DIAGNOSIS — C50811 Malignant neoplasm of overlapping sites of right female breast: Secondary | ICD-10-CM

## 2018-08-07 DIAGNOSIS — Z9289 Personal history of other medical treatment: Secondary | ICD-10-CM

## 2018-08-07 DIAGNOSIS — Z01419 Encounter for gynecological examination (general) (routine) without abnormal findings: Secondary | ICD-10-CM | POA: Diagnosis not present

## 2018-08-07 NOTE — Progress Notes (Signed)
Nichole [name] 03/02/1951 [id number]   History:    67 y.o. G4P4L4 Married.  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  Status post right breast lumpectomy, radiation therapy, chemotherapy, tamoxifen, and Anastrozole which she finished this year October 2018.  Had a laparoscopic bilateral salpingo-oophorectomy April 13, 2016.  Menopause, well on no hormone replacement therapy.  No postmenopausal bleeding.  No pelvic pain.  Vaginal dryness with pain with intercourse.  Rarely sexually active for that reason and also husband has erectile difficulties.  Breasts normal.  Body mass index 35.11.  Lost 11 pounds since last year.  Decrease portions and carbs.  Traveling and walking a lot.  Urine and bowel movements normal.  Health labs with Dr. [name].  Colonoscopy 2013.  Past medical history,surgical history, family history and social history were all reviewed and documented in the EPIC chart.  Gynecologic History No LMP recorded. Patient is postmenopausal. Contraception: post menopausal status Last Pap: 07/2017. Results were: Negative Last mammogram: 04/2018. Results were: Benign Bone Density: 07/2017 Ostopenia T-Score -1.5 at Spine Colonoscopy: 06/2012  Obstetric History OB History  Gravida Para Term Preterm AB Living  4 4 4     4   SAB TAB Ectopic Multiple Live Births          4    # Outcome Date GA Lbr Len/2nd Weight Sex Delivery Anes PTL Lv  4 Term     M CS-Unspec  N LIV  3 Term     M Vag-Spont  N LIV  2 Term     M Vag-Spont  N LIV  1 Term     M Vag-Spont  N LIV     ROS: A ROS was performed and pertinent positives and negatives are included in the history.  GENERAL: No fevers or chills. HEENT: No change in vision, no earache, sore throat or sinus congestion. NECK: No pain or stiffness. CARDIOVASCULAR: No chest pain or pressure. No palpitations.  PULMONARY: No shortness of breath, cough or wheeze. GASTROINTESTINAL: No abdominal pain, nausea, vomiting or diarrhea, melena or bright red blood per rectum. GENITOURINARY: No urinary frequency, urgency, hesitancy or dysuria. MUSCULOSKELETAL: No joint or muscle pain, no back pain, no recent trauma. DERMATOLOGIC: No rash, no itching, no lesions. ENDOCRINE: No polyuria, polydipsia, no heat or cold intolerance. No recent change in weight. HEMATOLOGICAL: No anemia or easy bruising or bleeding. NEUROLOGIC: No headache, seizures, numbness, tingling or weakness. PSYCHIATRIC: No depression, no loss of interest in normal activity or change in sleep pattern.     Exam:   BP 124/80 (BP Location: Right Arm, Patient Position: Sitting, Cuff Size: Large)   Ht 4\' 10"  (1.473 m)   Wt 168 lb (76.2 kg)   BMI 35.11 kg/m   Body mass index is 35.11 kg/m.  General appearance : Well developed well nourished female. No acute distress HEENT: Eyes: no retinal hemorrhage or exudates,  Neck supple, trachea midline, no carotid bruits, no thyroidmegaly Lungs: Clear to auscultation, no rhonchi or wheezes, or rib retractions  Heart: Regular rate and rhythm, no murmurs or gallops Breast:Examined in sitting and supine position were symmetrical in appearance, no palpable masses or tenderness,  no skin retraction, no nipple inversion, no nipple discharge, no skin discoloration, no axillary or supraclavicular lymphadenopathy Abdomen: no palpable masses or tenderness, no rebound or guarding Extremities: no edema or skin discoloration or  tenderness  Pelvic: Vulva: Normal             Vagina: No gross lesions or discharge  Cervix: No gross lesions or discharge  Uterus  AV, normal size, shape and consistency, non-tender and mobile  Adnexa  Without masses or tenderness  Anus: Normal   Assessment/Plan:  67 y.o. female for annual exam   1. Well female exam with routine gynecological exam Normal gynecologic exam and menopause.   Pap test -November 2018.  No indication to repeat Pap test this year.  Breast exam normal.  Screening mammogram was benign in August 2019.  Colonoscopy 2013.  Health labs with Dr. [name].  2. Postmenopause Well on no hormone replacement therapy.  Atrophic vaginitis of menopause.  History of breast cancer with estrogen receptors positive.  Patient will try Coconut oil as a lubricant.  3. Malignant neoplasm of overlapping sites of right breast in female, estrogen receptor positive ([place]) Right breast invasive ductal cancer diagnosed in 2012.  Estrogen receptors positive.  Finished with anastrozole in October 2018.  4. Osteopenia of lumbar spine Vitamin D supplements, calcium intake of 1.5 g/day and regular weightbearing physical activities recommended.  Will repeat a bone density in November 2020.  5. Class 2 obesity due to excess calories without serious comorbidity with body mass index (BMI) of 35.0 to 35.9 in adult Continue with weight loss.  Low calorie/carb diet such as [employer] suggested.  Aerobic physical activities 5 times a week and weightlifting every 2 days.  Princess Bruins MD, 12:53 PM 08/07/2018

## 2018-08-07 NOTE — Patient Instructions (Signed)
  1. Well female exam with routine gynecological exam Normal gynecologic exam and menopause.  Pap test -November 2018.  No indication to repeat Pap test this year.  Breast exam normal.  Screening mammogram was benign in August 2019.  Colonoscopy 2013.  Health labs with Dr. [name].  2. Postmenopause Well on no hormone replacement therapy.  Atrophic vaginitis of menopause.  History of breast cancer with estrogen receptors positive.  Patient will try Coconut oil as a lubricant.  3. Malignant neoplasm of overlapping sites of right breast in female, estrogen receptor positive ([place]) Right breast invasive ductal cancer diagnosed in 2012.  Estrogen receptors positive.  Finished with anastrozole in October 2018.  4. Osteopenia of lumbar spine Vitamin D supplements, calcium intake of 1.5 g/day and regular weightbearing physical activities recommended.  Will repeat a bone density in November 2020.  5. Class 2 obesity due to excess calories without serious comorbidity with body mass index (BMI) of 35.0 to 35.9 in adult Continue with weight loss.  Low calorie/carb diet such as [employer] suggested.  Aerobic physical activities 5 times a week and weightlifting every 2 days.  Priscille Loveless un placer verle hoy!

## 2018-08-16 ENCOUNTER — Encounter: Payer: Federal, State, Local not specified - PPO | Admitting: Internal Medicine

## 2018-08-23 DIAGNOSIS — D225 Melanocytic nevi of trunk: Secondary | ICD-10-CM | POA: Diagnosis not present

## 2018-08-23 DIAGNOSIS — D1722 Benign lipomatous neoplasm of skin and subcutaneous tissue of left arm: Secondary | ICD-10-CM | POA: Diagnosis not present

## 2018-08-23 DIAGNOSIS — D1721 Benign lipomatous neoplasm of skin and subcutaneous tissue of right arm: Secondary | ICD-10-CM | POA: Diagnosis not present

## 2018-10-28 ENCOUNTER — Other Ambulatory Visit: Payer: Self-pay | Admitting: Oncology

## 2019-03-21 ENCOUNTER — Encounter: Payer: Self-pay | Admitting: Internal Medicine

## 2019-03-21 ENCOUNTER — Telehealth: Payer: Self-pay | Admitting: Internal Medicine

## 2019-03-21 NOTE — Telephone Encounter (Signed)
error 

## 2019-04-19 DIAGNOSIS — K080 Exfoliation of teeth due to systemic causes: Secondary | ICD-10-CM | POA: Diagnosis not present

## 2019-05-08 ENCOUNTER — Encounter: Payer: Self-pay | Admitting: Orthopaedic Surgery

## 2019-05-08 ENCOUNTER — Ambulatory Visit (INDEPENDENT_AMBULATORY_CARE_PROVIDER_SITE_OTHER): Payer: Federal, State, Local not specified - PPO | Admitting: Orthopaedic Surgery

## 2019-05-08 ENCOUNTER — Ambulatory Visit: Payer: Self-pay

## 2019-05-08 DIAGNOSIS — M1711 Unilateral primary osteoarthritis, right knee: Secondary | ICD-10-CM | POA: Diagnosis not present

## 2019-05-08 DIAGNOSIS — M5441 Lumbago with sciatica, right side: Secondary | ICD-10-CM

## 2019-05-08 MED ORDER — LIDOCAINE HCL 1 % IJ SOLN
2.0000 mL | INTRAMUSCULAR | Status: AC | PRN
  Administered 2019-05-08: 10:00:00 2 mL

## 2019-05-08 MED ORDER — BUPIVACAINE HCL 0.25 % IJ SOLN
2.0000 mL | INTRAMUSCULAR | Status: AC | PRN
  Administered 2019-05-08: 10:00:00 2 mL via INTRA_ARTICULAR

## 2019-05-08 MED ORDER — METHYLPREDNISOLONE ACETATE 40 MG/ML IJ SUSP
40.0000 mg | INTRAMUSCULAR | Status: AC | PRN
  Administered 2019-05-08: 40 mg via INTRA_ARTICULAR

## 2019-05-08 NOTE — Progress Notes (Signed)
Office Visit Note   Patient: Nichole [name]           Date of Birth: July 18, 1951           MRN: [id number] Visit Date: 05/08/2019              Requested by: Colon Branch, [address] [place],  [place] [address] PCP: Colon Branch, MD   Assessment & Plan: Visit Diagnoses:  1. Patellofemoral arthritis of right knee   2. Right-sided low back pain with right-sided sciatica, unspecified chronicity     Plan: Impression is primary localized osteoarthritis right knee.  We will proceed with cortisone injection today in hopes of providing the patient with some relief.  I have also provided her with a viscosupplementation handout.  She will follow-up with [place] as needed.  Call with concerns or questions.  Follow-Up Instructions: Return if symptoms worsen or fail to improve.   Orders:  Orders Placed This Encounter  Procedures  . Large Joint Inj: R knee  . XR Lumbar Spine 2-3 Views  . XR Knee Complete 4 Views Right   No orders of the defined types were placed in this encounter.     Procedures: Large Joint Inj: R knee on 05/08/2019 10:18 AM Indications: pain Details: 22 G needle, anterolateral approach Medications: 2 mL bupivacaine 0.25 %; 2 mL lidocaine 1 %; 40 mg methylPREDNISolone acetate 40 MG/ML      Clinical Data: No additional findings.   Subjective: Chief Complaint  Patient presents with  . Right Leg - Pain    HPI patient is a pleasant 68 year old female who presents our clinic today with recurrent right knee pain.  History of patellofemoral osteoarthritis.  She was seen in our office for this a few years ago.  Her pain never resolved, in fact it has worsened recently.  No new injury or change in activity.  Pain she has primarily to the lateral aspect.  Worse with kneeling or going downstairs.  She has tried and failed oral anti-inflammatories.  No previous cortisone injection or surgical intervention.  Review of Systems as detailed in HPI.  All others  reviewed and are negative.   Objective: Vital Signs: There were no vitals taken for this visit.  Physical Exam well-developed well-nourished female in no acute distress.  Alert and oriented x3  Ortho Exam examination of her right knee shows no effusion.  Range of motion 0 to 125 degrees.  Marked patellofemoral crepitus.  Ligaments are stable.  She is neurovascular intact distally.  Specialty Comments:  No specialty comments available.  Imaging: Xr Knee Complete 4 Views Right  Result Date: 05/08/2019 X-rays demonstrate moderate joint space narrowing medial and patellofemoral compartments  Xr Lumbar Spine 2-3 Views  Result Date: 05/08/2019 X-rays demonstrate mild to moderate disc space narrowing L5-S1    PMFS History: Patient Active Problem List   Diagnosis Date Noted  . 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 PCP NOTES>>>>>>>>>>>>>>>>>> 11/21/2016  . Annual physical exam >>>>>>>>>>>>>. 11/19/2016  . Malignant neoplasm of overlapping sites of right breast in female, estrogen receptor positive ([place]) 11/15/2016  . Osteopenia 07/16/2015  . Other malaise and fatigue 05/24/2014  . Hypersomnolence 05/24/2014  . Chest pain, unspecified 05/24/2014  . Increased endometrial stripe thickness 07/18/2013  . Hyperlipidemia   . Family history of colon cancer 05/23/2012   Past Medical History:  Diagnosis Date  . Breast cancer ([place])   . Cancer [facility]) december 2012   right breast  .  History of breast cancer 07/2011   right  . History of chemotherapy 11/2011  . Hyperlipidemia    no current med.  . Personal history of chemotherapy    2012  . Personal history of radiation therapy    2012  . SVD (spontaneous vaginal delivery)    x 3    Family History  Problem Relation Age of Onset  . Colon cancer Mother        dx postpartum  . Congestive Heart Failure Mother   . Alzheimer's disease Mother   . Diabetes Father   . Breast cancer Maternal Aunt   . Breast cancer Cousin 81  . Diabetes Paternal  Grandmother   . Breast cancer Maternal Aunt   . AAA (abdominal aortic aneurysm) Neg Hx   . CAD Neg Hx     Past Surgical History:  Procedure Laterality Date  . BREAST LUMPECTOMY Right 08/2011  . BREAST LUMPECTOMY Right   . CARPAL TUNNEL RELEASE Left 06/19/2013   Procedure: CARPAL TUNNEL RELEASE;  Surgeon: Cammie Sickle., MD;  Location: [facility];  Service: Orthopedics;  Laterality: Left;  . CESAREAN SECTION     x 1  . COLON SURGERY     Hx polyps  . LAPAROSCOPIC SALPINGO OOPHERECTOMY Bilateral 04/13/2016   Procedure: LAPAROSCOPIC SALPINGO OOPHORECTOMY;  Surgeon: Terrance Mass, MD;  Location: [facility];  Service: Gynecology;  Laterality: Bilateral;  . PORT-A-CATH REMOVAL  06/2012  . PORTACATH PLACEMENT  2013  . SENTINEL LYMPH NODE BIOPSY Right 08/2011  . WISDOM TOOTH EXTRACTION     Social History   Occupational History  . Occupation: retired from the [employer]  . Smoking status: Former Smoker    Years: 3.00    Types: Cigarettes    Quit date: 09/13/2001    Years since quitting: 17.6  . Smokeless tobacco: Never Used  . Tobacco comment: social-light smoker  Substance and Sexual Activity  . Alcohol use: No  . Drug use: No  . Sexual activity: Yes    Partners: Male    Birth control/protection: Post-menopausal, None    Comment: 1st intercourse- 16, partners- 53, married- 10 yrs

## 2019-06-22 ENCOUNTER — Encounter: Payer: Self-pay | Admitting: Internal Medicine

## 2019-06-25 MED ORDER — ROSUVASTATIN CALCIUM 5 MG PO TABS: 5.0000 mg | ORAL_TABLET | Freq: Every day | ORAL | 0 refills | Status: DC

## 2019-07-17 ENCOUNTER — Other Ambulatory Visit: Payer: Self-pay

## 2019-07-18 ENCOUNTER — Telehealth: Payer: Self-pay | Admitting: Oncology

## 2019-07-18 ENCOUNTER — Encounter: Payer: Self-pay | Admitting: Internal Medicine

## 2019-07-18 ENCOUNTER — Ambulatory Visit (INDEPENDENT_AMBULATORY_CARE_PROVIDER_SITE_OTHER): Payer: Federal, State, Local not specified - PPO | Admitting: Internal Medicine

## 2019-07-18 VITALS — BP 118/58 | HR 79 | Temp 95.6°F | Resp 16 | Ht <= 58 in | Wt 176.4 lb

## 2019-07-18 DIAGNOSIS — Z170 Estrogen receptor positive status [ER+]: Secondary | ICD-10-CM

## 2019-07-18 DIAGNOSIS — C50811 Malignant neoplasm of overlapping sites of right female breast: Secondary | ICD-10-CM

## 2019-07-18 DIAGNOSIS — Z0000 Encounter for general adult medical examination without abnormal findings: Secondary | ICD-10-CM | POA: Diagnosis not present

## 2019-07-18 DIAGNOSIS — E785 Hyperlipidemia, unspecified: Secondary | ICD-10-CM

## 2019-07-18 DIAGNOSIS — Z1211 Encounter for screening for malignant neoplasm of colon: Secondary | ICD-10-CM

## 2019-07-18 DIAGNOSIS — Z1231 Encounter for screening mammogram for malignant neoplasm of breast: Secondary | ICD-10-CM | POA: Diagnosis not present

## 2019-07-18 DIAGNOSIS — Z803 Family history of malignant neoplasm of breast: Secondary | ICD-10-CM

## 2019-07-18 LAB — CBC WITH DIFFERENTIAL/PLATELET
Basophils Absolute: 0 10*3/uL (ref 0.0–0.1)
Basophils Relative: 0.6 % (ref 0.0–3.0)
Eosinophils Absolute: 0.2 10*3/uL (ref 0.0–0.7)
Eosinophils Relative: 2.1 % (ref 0.0–5.0)
HCT: 44 % (ref 36.0–46.0)
Hemoglobin: 14.6 g/dL (ref 12.0–15.0)
Lymphocytes Relative: 40.1 % (ref 12.0–46.0)
Lymphs Abs: 3 10*3/uL (ref 0.7–4.0)
MCHC: 33.1 g/dL (ref 30.0–36.0)
MCV: 89.2 fl (ref 78.0–100.0)
Monocytes Absolute: 0.4 10*3/uL (ref 0.1–1.0)
Monocytes Relative: 5.5 % (ref 3.0–12.0)
Neutro Abs: 3.8 10*3/uL (ref 1.4–7.7)
Neutrophils Relative %: 51.7 % (ref 43.0–77.0)
Platelets: 259 10*3/uL (ref 150.0–400.0)
RBC: 4.93 Mil/uL (ref 3.87–5.11)
RDW: 13.6 % (ref 11.5–15.5)
WBC: 7.4 10*3/uL (ref 4.0–10.5)

## 2019-07-18 LAB — COMPREHENSIVE METABOLIC PANEL
ALT: 17 U/L (ref 0–35)
AST: 18 U/L (ref 0–37)
Albumin: 4.3 g/dL (ref 3.5–5.2)
Alkaline Phosphatase: 82 U/L (ref 39–117)
BUN: 10 mg/dL (ref 6–23)
CO2: 30 mEq/L (ref 19–32)
Calcium: 9.2 mg/dL (ref 8.4–10.5)
Chloride: 104 mEq/L (ref 96–112)
Creatinine, Ser: 0.64 mg/dL (ref 0.40–1.20)
GFR: 91.78 mL/min (ref >60.00)
Glucose, Bld: 87 mg/dL (ref 70–99)
Potassium: 4.6 mEq/L (ref 3.5–5.1)
Sodium: 143 mEq/L (ref 135–145)
Total Bilirubin: 0.6 mg/dL (ref 0.2–1.2)
Total Protein: 6.5 g/dL (ref 6.0–8.3)

## 2019-07-18 LAB — TSH: TSH: 2.04 u[IU]/mL (ref 0.35–4.50)

## 2019-07-18 LAB — LIPID PANEL
Cholesterol: 203 mg/dL — ABNORMAL HIGH (ref 0–200)
HDL: 55.3 mg/dL (ref >39.00)
LDL Cholesterol: 113 mg/dL — ABNORMAL HIGH (ref 0–99)
NonHDL: 147.61
Total CHOL/HDL Ratio: 4
Triglycerides: 171 mg/dL — ABNORMAL HIGH (ref 0.0–149.0)
VLDL: 34.2 mg/dL (ref 0.0–40.0)

## 2019-07-18 LAB — HEMOGLOBIN A1C: Hgb A1c MFr Bld: 5.8 % (ref 4.6–6.5)

## 2019-07-18 MED ORDER — VALACYCLOVIR HCL 1 G PO TABS: ORAL_TABLET | ORAL | 2 refills | Status: DC

## 2019-07-18 NOTE — Progress Notes (Signed)
Pre visit review using our clinic review tool, if applicable. No additional management support is needed unless otherwise documented below in the visit note. 

## 2019-07-18 NOTE — Progress Notes (Signed)
Subjective:    Patient ID: Nichole [name], female    DOB: 1950/11/21, 68 y.o.   MRN: [id number]  DOS:  07/18/2019 Type of visit - description: CPX No major concerns.   Review of Systems  A 14 point review of systems is negative   Past Medical History:  Diagnosis Date  . Breast cancer ([place])   . Cancer [facility]) december 2012   right breast  . History of breast cancer 07/2011   right  . History of chemotherapy 11/2011  . Hyperlipidemia    no current med.  . Personal history of chemotherapy    2012  . Personal history of radiation therapy    2012  . SVD (spontaneous vaginal delivery)    x 3    Past Surgical History:  Procedure Laterality Date  . BREAST LUMPECTOMY Right 08/2011  . BREAST LUMPECTOMY Right   . CARPAL TUNNEL RELEASE Left 06/19/2013   Procedure: CARPAL TUNNEL RELEASE;  Surgeon: Cammie Sickle., MD;  Location: [place];  Service: Orthopedics;  Laterality: Left;  . CESAREAN SECTION     x 1  . COLON SURGERY     Hx polyps  . LAPAROSCOPIC SALPINGO OOPHERECTOMY Bilateral 04/13/2016   Procedure: LAPAROSCOPIC SALPINGO OOPHORECTOMY;  Surgeon: Terrance Mass, MD;  Location: [facility];  Service: Gynecology;  Laterality: Bilateral;  . PORT-A-CATH REMOVAL  06/2012  . PORTACATH PLACEMENT  2013  . SENTINEL LYMPH NODE BIOPSY Right 08/2011  . WISDOM TOOTH EXTRACTION      Social History   Socioeconomic History  . Marital status: Married    Spouse name: Not on file  . Number of children: 4  . Years of education: Not on file  . Highest education level: Not on file  Occupational History  . Occupation: retired from the [employer]  . Financial resource strain: Not on file  . Food insecurity    Worry: Not on file    Inability: Not on file  . Transportation needs    Medical: Not on file    Non-medical: Not on file  Tobacco Use  . Smoking status: Former Smoker    Years: 3.00    Types: Cigarettes    Quit date: 09/13/2001    Years  since quitting: 17.8  . Smokeless tobacco: Never Used  . Tobacco comment: social-light smoker  Substance and Sexual Activity  . Alcohol use: No  . Drug use: No  . Sexual activity: Yes    Partners: Male    Birth control/protection: Post-menopausal, None    Comment: 1st intercourse- 16, partners- 43, married- 10 yrs   Lifestyle  . Physical activity    Days per week: Not on file    Minutes per session: Not on file  . Stress: Not on file  Relationships  . Social Herbalist on phone: Not on file    Gets together: Not on file    Attends religious service: Not on file    Active member of club or organization: Not on file    Attends meetings of clubs or organizations: Not on file    Relationship status: Not on file  . Intimate partner violence    Fear of current or ex partner: Not on file    Emotionally abused: Not on file    Physically abused: Not on file    Forced sexual activity: Not on file  Other Topics Concern  . Not on file  Social History  Narrative   Original from [place]   Lives w/ husband, travels frequently     Family History  Problem Relation Age of Onset  . Colon cancer Mother        dx postpartum  . Congestive Heart Failure Mother   . Alzheimer's disease Mother   . Diabetes Father   . Breast cancer Maternal Aunt   . Breast cancer Cousin 16  . Diabetes Paternal Grandmother   . Breast cancer Maternal Aunt   . AAA (abdominal aortic aneurysm) Neg Hx   . CAD Neg Hx      Allergies as of 07/18/2019      Reactions   Penicillins Rash   Has patient had a PCN reaction causing immediate rash, facial/tongue/throat swelling, SOB or lightheadedness with hypotension: No Has patient had a PCN reaction causing severe rash involving mucus membranes or skin necrosis: No Has patient had a PCN reaction that required hospitalization No Has patient had a PCN reaction occurring within the last 10 years: Yes If all of the above answers are "NO", then may proceed with  Cephalosporin use.   Poractant Alfa Rash   Pork-derived Products Rash      Medication List       Accurate as of July 18, 2019 11:59 PM. If you have any questions, ask your nurse or doctor.        STOP taking these medications   CALCIUM-VITAMIN D PO Stopped by: Kathlene November, MD     TAKE these medications   cholecalciferol 25 MCG (1000 UT) tablet Commonly known as: VITAMIN D3 Take 1,000 Units by mouth daily.   rosuvastatin 5 MG tablet Commonly known as: CRESTOR Take 1 tablet (5 mg total) by mouth daily.   valACYclovir 1000 MG tablet Commonly known as: Valtrex For each episode, take 2 tablets twice a day for 1 day What changed:   how much to take  how to take this  when to take this Changed by: Kathlene November, MD           Objective:   Physical Exam BP (!) 118/58 (BP Location: Left Arm, Patient Position: Sitting, Cuff Size: Normal)   Pulse 79   Temp (!) 95.6 F (35.3 C) (Temporal)   Resp 16   Ht 4\' 10"  (1.473 m)   Wt 176 lb 6 oz (80 kg)   SpO2 98%   BMI 36.86 kg/m  General: Well developed, NAD, BMI noted Neck: No  thyromegaly  HEENT:  Normocephalic . Face symmetric, atraumatic Lungs:  CTA B Normal respiratory effort, no intercostal retractions, no accessory muscle use. Heart: RRR,  no murmur.  No pretibial edema bilaterally  Abdomen:  Not distended, soft, non-tender. No rebound or rigidity.   Skin: Exposed areas without rash. Not pale. Not jaundice Neurologic:  alert & oriented X3.  Speech normal, gait appropriate for age and unassisted Strength symmetric and appropriate for age.  Psych: Cognition and judgment appear intact.  Cooperative with normal attention span and concentration.  Behavior appropriate. No anxious or depressed appearing.     Assessment     Assessment  (New patient 11-2016) Hyperlipidemia Breast cancer R, 2012, lumpectomy, chemotherapy, s/p Port-A-Cath; d/c anastrazole 2018; d/c from oncology, f/u w/ PCP, will need yearly MMG  and clinical breast exam Lipoma, left abdomen. Reactive airway disease Herpes labialis- valtrex prn  PLAN Here for CPX Hyperlipidemia: On Crestor, checking labs. Osteopenia : Last DEXA 2018, T score -1.5.  Encourage exercise, calcium, vitamin D.  Consider recheck a DEXA  next year Other problems are stable RTC 1 year

## 2019-07-18 NOTE — Patient Instructions (Signed)
GO TO THE LAB : Get the blood work     GO TO THE FRONT DESK Schedule your next appointment   for a physical exam in 1 year 

## 2019-07-18 NOTE — Telephone Encounter (Signed)
Scheduled appt per 11/4 sch message - unable to reach pt . Left message with appt date and time   

## 2019-07-18 NOTE — Assessment & Plan Note (Addendum)
--  Td 2014 - Prevnar 2018 - pnm 23: 2019 - zostavax 2015 - shingrix d/w pt, declined for now - declined flu shot   --Female care: s/p B oophorectomy, no hysterectomy, to see gyn 10/2019, reports clinical breast exam done at gynecology --H/o breast CA- last MMG 04/2018, Rx MMG --++FH personal history breast cancer, s/p genetics eval ~ 2012, this is a very changing field, offered a referral to a local geneticist for a reassessment, we agreed on a referral --CCS:  had a colonoscopy 2010 in [place], had a flat plaque @ rectum.   cscope 06-2012, Dr Elbert Ewings, no polyps  OV w/ Dr Elbert Ewings 04/2017, she was told RTC 2 years - referral sent  -- labs: CMP, FLP, CBC, A1c, TSH --Diet and exercise discussed

## 2019-07-19 NOTE — Assessment & Plan Note (Signed)
Here for CPX Hyperlipidemia: On Crestor, checking labs. Osteopenia : Last DEXA 2018, T score -1.5.  Encourage exercise, calcium, vitamin D.  Consider recheck a DEXA next year Other problems are stable RTC 1 year

## 2019-07-23 DIAGNOSIS — K5730 Diverticulosis of large intestine without perforation or abscess without bleeding: Secondary | ICD-10-CM | POA: Diagnosis not present

## 2019-07-23 DIAGNOSIS — Z1211 Encounter for screening for malignant neoplasm of colon: Secondary | ICD-10-CM | POA: Diagnosis not present

## 2019-07-25 ENCOUNTER — Telehealth: Payer: Self-pay | Admitting: Licensed Clinical Social Worker

## 2019-07-25 DIAGNOSIS — K080 Exfoliation of teeth due to systemic causes: Secondary | ICD-10-CM | POA: Diagnosis not present

## 2019-07-25 NOTE — Telephone Encounter (Signed)
Called patient regarding upcoming Web[facility] appointment, left a voicemail. This will be a walk-in visit. °

## 2019-07-26 ENCOUNTER — Inpatient Hospital Stay: Payer: Federal, State, Local not specified - PPO

## 2019-07-26 ENCOUNTER — Inpatient Hospital Stay: Payer: Federal, State, Local not specified - PPO | Admitting: Licensed Clinical Social Worker

## 2019-07-30 DIAGNOSIS — K080 Exfoliation of teeth due to systemic causes: Secondary | ICD-10-CM | POA: Diagnosis not present

## 2019-08-01 ENCOUNTER — Other Ambulatory Visit: Payer: Self-pay

## 2019-08-01 ENCOUNTER — Ambulatory Visit
Admission: RE | Admit: 2019-08-01 | Discharge: 2019-08-01 | Disposition: A | Discharge status: Home / Self Care | Payer: Federal, State, Local not specified - PPO | Source: Ambulatory Visit | Attending: Internal Medicine | Admitting: Internal Medicine

## 2019-08-01 DIAGNOSIS — Z1231 Encounter for screening mammogram for malignant neoplasm of breast: Secondary | ICD-10-CM | POA: Diagnosis not present

## 2019-08-07 DIAGNOSIS — K5730 Diverticulosis of large intestine without perforation or abscess without bleeding: Secondary | ICD-10-CM | POA: Diagnosis not present

## 2019-08-07 DIAGNOSIS — D122 Benign neoplasm of ascending colon: Secondary | ICD-10-CM | POA: Diagnosis not present

## 2019-08-07 DIAGNOSIS — K635 Polyp of colon: Secondary | ICD-10-CM | POA: Diagnosis not present

## 2019-08-07 DIAGNOSIS — Z1211 Encounter for screening for malignant neoplasm of colon: Secondary | ICD-10-CM | POA: Diagnosis not present

## 2019-08-07 LAB — HM COLONOSCOPY

## 2019-08-14 ENCOUNTER — Other Ambulatory Visit: Payer: Self-pay

## 2019-08-15 ENCOUNTER — Ambulatory Visit: Payer: Federal, State, Local not specified - PPO | Admitting: Internal Medicine

## 2019-08-15 ENCOUNTER — Encounter: Payer: Self-pay | Admitting: Internal Medicine

## 2019-08-15 VITALS — BP 117/77 | HR 92 | Temp 97.1°F | Resp 18 | Ht <= 58 in | Wt 179.4 lb

## 2019-08-15 DIAGNOSIS — R030 Elevated blood-pressure reading, without diagnosis of hypertension: Secondary | ICD-10-CM

## 2019-08-15 NOTE — Patient Instructions (Signed)
Check the  blood pressure 2 times a week BP GOAL is between 110/65 and  135/85. If it is consistently higher or lower, let me know    DASH Eating Plan DASH stands for "Dietary Approaches to Stop Hypertension." The DASH eating plan is a healthy eating plan that has been shown to reduce high blood pressure (hypertension). It may also reduce your risk for type 2 diabetes, heart disease, and stroke. The DASH eating plan may also help with weight loss. What are tips for following this plan?  General guidelines  Avoid eating more than 2,300 mg (milligrams) of salt (sodium) a day. If you have hypertension, you may need to reduce your sodium intake to 1,500 mg a day.  Limit alcohol intake to no more than 1 drink a day for nonpregnant women and 2 drinks a day for men. One drink equals 12 oz of beer, 5 oz of wine, or 1 oz of hard liquor.  Work with your health care provider to maintain a healthy body weight or to lose weight. Ask what an ideal weight is for you.  Get at least 30 minutes of exercise that causes your heart to beat faster (aerobic exercise) most days of the week. Activities may include walking, swimming, or biking.  Work with your health care provider or diet and nutrition specialist (dietitian) to adjust your eating plan to your individual calorie needs. Reading food labels   Check food labels for the amount of sodium per serving. Choose foods with less than 5 percent of the Daily Value of sodium. Generally, foods with less than 300 mg of sodium per serving fit into this eating plan.  To find whole grains, look for the word "whole" as the first word in the ingredient list. Shopping  Buy products labeled as "low-sodium" or "no salt added."  Buy fresh foods. Avoid canned foods and premade or frozen meals. Cooking  Avoid adding salt when cooking. Use salt-free seasonings or herbs instead of table salt or sea salt. Check with your health care provider or pharmacist before  using salt substitutes.  Do not fry foods. Cook foods using healthy methods such as baking, boiling, grilling, and broiling instead.  Cook with heart-healthy oils, such as olive, canola, soybean, or sunflower oil. Meal planning  Eat a balanced diet that includes: ? 5 or more servings of fruits and vegetables each day. At each meal, try to fill half of your plate with fruits and vegetables. ? Up to 6-8 servings of whole grains each day. ? Less than 6 oz of lean meat, poultry, or fish each day. A 3-oz serving of meat is about the same size as a deck of cards. One egg equals 1 oz. ? 2 servings of low-fat dairy each day. ? A serving of nuts, seeds, or beans 5 times each week. ? Heart-healthy fats. Healthy fats called Omega-3 fatty acids are found in foods such as flaxseeds and coldwater fish, like sardines, salmon, and mackerel.  Limit how much you eat of the following: ? Canned or prepackaged foods. ? Food that is high in trans fat, such as fried foods. ? Food that is high in saturated fat, such as fatty meat. ? Sweets, desserts, sugary drinks, and other foods with added sugar. ? Full-fat dairy products.  Do not salt foods before eating.  Try to eat at least 2 vegetarian meals each week.  Eat more home-cooked food and less restaurant, buffet, and fast food.  When eating at a restaurant,  ask that your food be prepared with less salt or no salt, if possible. What foods are recommended? The items listed may not be a complete list. Talk with your dietitian about what dietary choices are best for you. Grains Whole-grain or whole-wheat bread. Whole-grain or whole-wheat pasta. Brown rice. [place]. Bulgur. Whole-grain and low-sodium cereals. Pita bread. Low-fat, low-sodium crackers. Whole-wheat flour tortillas. Vegetables Fresh or frozen vegetables (raw, steamed, roasted, or grilled). Low-sodium or reduced-sodium tomato and vegetable juice. Low-sodium or reduced-sodium tomato sauce and  tomato paste. Low-sodium or reduced-sodium canned vegetables. Fruits All fresh, dried, or frozen fruit. Canned fruit in natural juice (without added sugar). Meat and other protein foods Skinless chicken or [place]. Ground chicken or [place]. Pork with fat trimmed off. Fish and seafood. Egg whites. Dried beans, peas, or lentils. Unsalted nuts, nut butters, and seeds. Unsalted canned beans. Lean cuts of beef with fat trimmed off. Low-sodium, lean deli meat. Dairy Low-fat (1%) or fat-free (skim) milk. Fat-free, low-fat, or reduced-fat cheeses. Nonfat, low-sodium ricotta or cottage cheese. Low-fat or nonfat yogurt. Low-fat, low-sodium cheese. Fats and oils Soft margarine without trans fats. Vegetable oil. Low-fat, reduced-fat, or light mayonnaise and salad dressings (reduced-sodium). Canola, safflower, olive, soybean, and sunflower oils. Avocado. Seasoning and other foods Herbs. Spices. Seasoning mixes without salt. Unsalted popcorn and pretzels. Fat-free sweets. What foods are not recommended? The items listed may not be a complete list. Talk with your dietitian about what dietary choices are best for you. Grains Baked goods made with fat, such as croissants, muffins, or some breads. Dry pasta or rice meal packs. Vegetables Creamed or fried vegetables. Vegetables in a cheese sauce. Regular canned vegetables (not low-sodium or reduced-sodium). Regular canned tomato sauce and paste (not low-sodium or reduced-sodium). Regular tomato and vegetable juice (not low-sodium or reduced-sodium). Angie Fava. Olives. Fruits Canned fruit in a light or heavy syrup. Fried fruit. Fruit in cream or butter sauce. Meat and other protein foods Fatty cuts of meat. Ribs. Fried meat. Berniece Salines. Sausage. Bologna and other processed lunch meats. Salami. Fatback. Hotdogs. Bratwurst. Salted nuts and seeds. Canned beans with added salt. Canned or smoked fish. Whole eggs or egg yolks. Chicken or [place] with skin. Dairy Whole or 2%  milk, cream, and half-and-half. Whole or full-fat cream cheese. Whole-fat or sweetened yogurt. Full-fat cheese. Nondairy creamers. Whipped toppings. Processed cheese and cheese spreads. Fats and oils Butter. Stick margarine. Lard. Shortening. Ghee. Bacon fat. Tropical oils, such as coconut, palm kernel, or palm oil. Seasoning and other foods Salted popcorn and pretzels. Onion salt, garlic salt, seasoned salt, table salt, and sea salt. Worcestershire sauce. Tartar sauce. Barbecue sauce. Teriyaki sauce. Soy sauce, including reduced-sodium. Steak sauce. Canned and packaged gravies. Fish sauce. Oyster sauce. Cocktail sauce. Horseradish that you find on the shelf. Ketchup. Mustard. Meat flavorings and tenderizers. Bouillon cubes. Hot sauce and Tabasco sauce. Premade or packaged marinades. Premade or packaged taco seasonings. Relishes. Regular salad dressings. Where to find more information:  National Heart, Lung, and [place]: https://wilson-eaton.com/  American Heart Association: www.heart.org Summary  The DASH eating plan is a healthy eating plan that has been shown to reduce high blood pressure (hypertension). It may also reduce your risk for type 2 diabetes, heart disease, and stroke.  With the DASH eating plan, you should limit salt (sodium) intake to 2,300 mg a day. If you have hypertension, you may need to reduce your sodium intake to 1,500 mg a day.  When on the DASH eating plan, aim to eat more fresh  fruits and vegetables, whole grains, lean proteins, low-fat dairy, and heart-healthy fats.  Work with your health care provider or diet and nutrition specialist (dietitian) to adjust your eating plan to your individual calorie needs. This information is not intended to replace advice given to you by your health care provider. Make sure you discuss any questions you have with your health care provider. Document Released: 08/19/2011 Document Revised: 08/12/2017 Document Reviewed: 08/23/2016  Elsevier Patient Education  2020 [employer].

## 2019-08-15 NOTE — Progress Notes (Signed)
Pre visit review using our clinic review tool, if applicable. No additional management support is needed unless otherwise documented below in the visit note. 

## 2019-08-15 NOTE — Progress Notes (Signed)
Subjective:    Patient ID: Nichole [name], female    DOB: [birth year], 68 y.o.   MRN: [id number]  DOS:  08/15/2019 Type of visit - description: Acute  BP was elevated during her recent colonoscopy 08/07/2019: 163/86, 189/99, 186/106. At that time, she was feeling well. She obviously had diarrhea taking the prep.  Since then, her husband check her blood pressure x1 around 11/29 and it was still a little elevated in the 160s.  Review of Systems Denies chest pain no difficulty breathing No headache or dizziness  Past Medical History:  Diagnosis Date  . Breast cancer ([place])   . Cancer [facility]) december 2012   right breast  . History of breast cancer 07/2011   right  . History of chemotherapy 11/2011  . Hyperlipidemia    no current med.  . Personal history of chemotherapy    2012  . Personal history of radiation therapy    2012  . SVD (spontaneous vaginal delivery)    x 3    Past Surgical History:  Procedure Laterality Date  . BREAST LUMPECTOMY Right 08/2011  . BREAST LUMPECTOMY Right   . CARPAL TUNNEL RELEASE Left 06/19/2013   Procedure: CARPAL TUNNEL RELEASE;  Surgeon: Cammie Sickle., MD;  Location: [place];  Service: Orthopedics;  Laterality: Left;  . CESAREAN SECTION     x 1  . COLON SURGERY     Hx polyps  . LAPAROSCOPIC SALPINGO OOPHERECTOMY Bilateral 04/13/2016   Procedure: LAPAROSCOPIC SALPINGO OOPHORECTOMY;  Surgeon: Terrance Mass, MD;  Location: [facility];  Service: Gynecology;  Laterality: Bilateral;  . PORT-A-CATH REMOVAL  06/2012  . PORTACATH PLACEMENT  2013  . SENTINEL LYMPH NODE BIOPSY Right 08/2011  . WISDOM TOOTH EXTRACTION      Social History   Socioeconomic History  . Marital status: Married    Spouse name: Not on file  . Number of children: 4  . Years of education: Not on file  . Highest education level: Not on file  Occupational History  . Occupation: retired from the [employer]  . Financial resource  strain: Not on file  . Food insecurity    Worry: Not on file    Inability: Not on file  . Transportation needs    Medical: Not on file    Non-medical: Not on file  Tobacco Use  . Smoking status: Former Smoker    Years: 3.00    Types: Cigarettes    Quit date: 09/13/2001    Years since quitting: 17.9  . Smokeless tobacco: Never Used  . Tobacco comment: social-light smoker  Substance and Sexual Activity  . Alcohol use: No  . Drug use: No  . Sexual activity: Yes    Partners: Male    Birth control/protection: Post-menopausal, None    Comment: 1st intercourse- 16, partners- 37, married- 10 yrs   Lifestyle  . Physical activity    Days per week: Not on file    Minutes per session: Not on file  . Stress: Not on file  Relationships  . Social Herbalist on phone: Not on file    Gets together: Not on file    Attends religious service: Not on file    Active member of club or organization: Not on file    Attends meetings of clubs or organizations: Not on file    Relationship status: Not on file  . Intimate partner violence    Fear of  current or ex partner: Not on file    Emotionally abused: Not on file    Physically abused: Not on file    Forced sexual activity: Not on file  Other Topics Concern  . Not on file  Social History Narrative   Original from [place]   Lives w/ husband, travels frequently      Allergies as of 08/15/2019      Reactions   Penicillins Rash   Has patient had a PCN reaction causing immediate rash, facial/tongue/throat swelling, SOB or lightheadedness with hypotension: No Has patient had a PCN reaction causing severe rash involving mucus membranes or skin necrosis: No Has patient had a PCN reaction that required hospitalization No Has patient had a PCN reaction occurring within the last 10 years: Yes If all of the above answers are "NO", then may proceed with Cephalosporin use.   Poractant Alfa Rash   Pork-derived Products Rash      Medication  List       Accurate as of August 15, 2019  2:40 PM. If you have any questions, ask your nurse or doctor.        cholecalciferol 25 MCG (1000 UT) tablet Commonly known as: VITAMIN D3 Take 1,000 Units by mouth daily.   rosuvastatin 5 MG tablet Commonly known as: CRESTOR Take 1 tablet (5 mg total) by mouth daily.   valACYclovir 1000 MG tablet Commonly known as: Valtrex For each episode, take 2 tablets twice a day for 1 day           Objective:   Physical Exam BP 117/77 (BP Location: Left Arm, Patient Position: Sitting, Cuff Size: Small)   Pulse 92   Temp (!) 97.1 F (36.2 C) (Temporal)   Resp 18   Ht 4\' 10"  (1.473 m)   Wt 179 lb 6 oz (81.4 kg)   SpO2 98%   BMI 37.49 kg/m  General:   Well developed, NAD, BMI noted. HEENT:  Normocephalic . Face symmetric, atraumatic Lungs:  CTA B Normal respiratory effort, no intercostal retractions, no accessory muscle use. Heart: RRR,  no murmur.  No pretibial edema bilaterally  Skin: Not pale. Not jaundice Neurologic:  alert & oriented X3.  Speech normal, gait appropriate for age and unassisted Psych--  Cognition and judgment appear intact.  Cooperative with normal attention span and concentration.  Behavior appropriate. No anxious or depressed appearing.      Assessment    Assessment  (New patient 11-2016) Hyperlipidemia Breast cancer R, 2012, lumpectomy, chemotherapy, s/p Port-A-Cath; d/c anastrazole 2018; d/c from oncology, f/u w/ PCP, will need yearly MMG and clinical breast exam Lipoma, left abdomen. Reactive airway disease Herpes labialis- valtrex prn  PLAN Elevated BP: Elevated BP 1 day in the context of getting a colonoscopy (possibly dehydrated?). Had no symptoms and she continued to feel well. On chart review BPs have always been normal. She is concerned about her heart, EKG today: Reviewed with cardiology, no acute changes, not significant different from previous EKGs. Plan: Recommend monitor BPs,  low-salt diet, call if BP elevated.    This visit occurred during the SARS-CoV-2 public health emergency.  Safety protocols were in place, including screening questions prior to the visit, additional usage of staff PPE, and extensive cleaning of exam room while observing appropriate contact time as indicated for disinfecting solutions.

## 2019-08-16 NOTE — Assessment & Plan Note (Signed)
Elevated BP: Elevated BP 1 day in the context of getting a colonoscopy (possibly dehydrated?). Had no symptoms and she continued to feel well. On chart review BPs have always been normal. She is concerned about her heart, EKG today: Reviewed with cardiology, no acute changes, not significant different from previous EKGs. Plan: Recommend monitor BPs, low-salt diet, call if BP elevated.

## 2019-09-18 ENCOUNTER — Other Ambulatory Visit: Payer: Self-pay | Admitting: Internal Medicine

## 2019-10-10 ENCOUNTER — Ambulatory Visit (INDEPENDENT_AMBULATORY_CARE_PROVIDER_SITE_OTHER): Payer: Federal, State, Local not specified - PPO | Admitting: Obstetrics & Gynecology

## 2019-10-10 ENCOUNTER — Other Ambulatory Visit: Payer: Self-pay

## 2019-10-10 ENCOUNTER — Encounter: Payer: Self-pay | Admitting: Obstetrics & Gynecology

## 2019-10-10 VITALS — BP 118/80 | Ht <= 58 in | Wt 180.0 lb

## 2019-10-10 DIAGNOSIS — Z853 Personal history of malignant neoplasm of breast: Secondary | ICD-10-CM

## 2019-10-10 DIAGNOSIS — Z6838 Body mass index (BMI) 38.0-38.9, adult: Secondary | ICD-10-CM

## 2019-10-10 DIAGNOSIS — C50811 Malignant neoplasm of overlapping sites of right female breast: Secondary | ICD-10-CM

## 2019-10-10 DIAGNOSIS — M8588 Other specified disorders of bone density and structure, other site: Secondary | ICD-10-CM

## 2019-10-10 DIAGNOSIS — Z780 Asymptomatic menopausal state: Secondary | ICD-10-CM

## 2019-10-10 DIAGNOSIS — Z170 Estrogen receptor positive status [ER+]: Secondary | ICD-10-CM

## 2019-10-10 DIAGNOSIS — E6609 Other obesity due to excess calories: Secondary | ICD-10-CM

## 2019-10-10 DIAGNOSIS — Z01419 Encounter for gynecological examination (general) (routine) without abnormal findings: Secondary | ICD-10-CM

## 2019-10-10 NOTE — Progress Notes (Signed)
Nichole [name] 1950/10/24 [id number]   History:    69 y.o. G4P4L4 Married.  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 Status post right breast lumpectomy, radiation therapy, chemotherapy, tamoxifen, and Anastrozole which she finished October 2018. Had a laparoscopic bilateral salpingo-oophorectomy April 13, 2016.  Menopause, well on no hormone replacement therapy.  No postmenopausal bleeding.  No pelvic pain.  Abstinent, husband has ED.  Breasts normal.  Body mass index 35.11 last year, now increased at 38.95.  Decrease portions and carbs.  Needs to walk more.  Urine and bowel movements normal. Health labs with Dr. [name].  Colonoscopy 07/2019.   Past medical history,surgical history, family history and social history were all reviewed and documented in the EPIC chart.  Gynecologic History No LMP recorded. Patient is postmenopausal.  Obstetric History OB History  Gravida Para Term Preterm AB Living  4 4 4     4   SAB TAB Ectopic Multiple Live Births          4    # Outcome Date GA Lbr Len/2nd Weight Sex Delivery Anes PTL Lv  4 Term     M CS-Unspec  N LIV  3 Term     M Vag-Spont  N LIV  2 Term     M Vag-Spont  N LIV  1 Term     M Vag-Spont  N LIV     ROS: A ROS was performed and pertinent positives and negatives are included in the history.  GENERAL: No fevers or chills. HEENT: No change in vision, no earache, sore throat or sinus congestion. NECK: No pain or stiffness. CARDIOVASCULAR: No chest pain or pressure. No palpitations. PULMONARY: No shortness of breath, cough or wheeze. GASTROINTESTINAL: No abdominal pain, nausea, vomiting or diarrhea, melena or bright red blood per rectum. GENITOURINARY: No urinary frequency, urgency, hesitancy or dysuria. MUSCULOSKELETAL: No joint or muscle pain, no back pain, no recent trauma. DERMATOLOGIC: No  rash, no itching, no lesions. ENDOCRINE: No polyuria, polydipsia, no heat or cold intolerance. No recent change in weight. HEMATOLOGICAL: No anemia or easy bruising or bleeding. NEUROLOGIC: No headache, seizures, numbness, tingling or weakness. PSYCHIATRIC: No depression, no loss of interest in normal activity or change in sleep pattern.     Exam:   BP 118/80 (BP Location: Right Arm, Patient Position: Sitting, Cuff Size: Normal)   Ht 4\' 9"  (1.448 m)   Wt 180 lb (81.6 kg)   BMI 38.95 kg/m   Body mass index is 38.95 kg/m.  General appearance : Well developed well nourished female. No acute distress HEENT: Eyes: no retinal hemorrhage or exudates,  Neck supple, trachea midline, no carotid bruits, no thyroidmegaly Lungs: Clear to auscultation, no rhonchi or wheezes, or rib retractions  Heart: Regular rate and rhythm, no murmurs or gallops Breast:Examined in sitting and supine position were symmetrical in appearance, no palpable masses or tenderness,  no skin retraction, no nipple inversion, no nipple discharge, no skin discoloration, no axillary or supraclavicular lymphadenopathy Abdomen: no palpable masses or tenderness, no rebound or guarding Extremities: no edema or skin discoloration or tenderness  Pelvic: Vulva: Normal             Vagina: No gross lesions or discharge  Cervix: No gross lesions or discharge  Uterus  AV, normal size, shape and consistency, non-tender and mobile  Adnexa  Without masses or  tenderness  Anus: Normal   Assessment/Plan:  69 y.o. female for annual exam   1. Well female exam with routine gynecological exam Normal gynecologic exam in menopause.  Pap test 07/2017 Negative, no indication to repeat Pap tests at this point.  Breasts normal, s/p Rt Breast lumpectomy.  Screening Mammo 07/2019 Negative.  [place] 2020.  Health labs with Fam MD.  2. Postmenopause Well on no HRT.  No PMB.  3. Osteopenia of lumbar spine Schedule BD at [facility] now, message sent for  orders.  Vit D supplements, Ca++ intake of 1200 mg daily and regular weight bearing physical activities.  4. Malignant neoplasm of overlapping sites of right breast in female, estrogen receptor positive ([place]) Right breast invasive ductal carcinoma estrogen receptor positive diagnosed in 2012. Status post right breast lumpectomy, radiation therapy, chemotherapy, tamoxifen, and Anastrozole which she finished October 2018. Had a laparoscopic bilateral salpingo-oophorectomy April 13, 2016.  5. Class 2 obesity due to excess calories without serious comorbidity with body mass index (BMI) of 38.0 to 38.9 in adult Low Calorie/low Carb diet such as [employer] recommended.  Aerobic physical activities 5 times a week and light weight lifting every 2 days.  Princess Bruins MD, 11:00 AM 10/10/2019

## 2019-10-10 NOTE — Patient Instructions (Signed)
1. Well female exam with routine gynecological exam Normal gynecologic exam in menopause.  Pap test 07/2017 Negative, no indication to repeat Pap tests at this point.  Breasts normal, s/p Rt Breast lumpectomy.  Screening Mammo 07/2019 Negative.  [place] 2020.  Health labs with Fam MD.  2. Postmenopause Well on no HRT.  No PMB.  3. Osteopenia of lumbar spine Schedule BD at [facility] now, message sent for orders.  Vit D supplements, Ca++ intake of 1200 mg daily and regular weight bearing physical activities.  4. Malignant neoplasm of overlapping sites of right breast in female, estrogen receptor positive ([place]) Right breast invasive ductal carcinoma estrogen receptor positive diagnosed in 2012. Status post right breast lumpectomy, radiation therapy, chemotherapy, tamoxifen, and Anastrozole which she finished October 2018. Had a laparoscopic bilateral salpingo-oophorectomy April 13, 2016.  5. Class 2 obesity due to excess calories without serious comorbidity with body mass index (BMI) of 38.0 to 38.9 in adult Low Calorie/low Carb diet such as [employer] recommended.  Aerobic physical activities 5 times a week and light weight lifting every 2 days.  Gai, it was a pleasure seeing you today!

## 2019-10-12 ENCOUNTER — Telehealth: Payer: Self-pay | Admitting: *Deleted

## 2019-10-12 NOTE — Telephone Encounter (Signed)
-----   Message from Princess Bruins, MD sent at 10/10/2019 11:12 AM EST ----- Regarding: Order for a repeat Bone Density at [facility] Osteopenia last BD 07/2017.  Schedule BD at [facility].

## 2019-10-12 NOTE — Telephone Encounter (Signed)
Order faxed to [facility], they will to schedule.

## 2019-10-22 ENCOUNTER — Encounter: Payer: Federal, State, Local not specified - PPO | Admitting: Obstetrics & Gynecology

## 2019-11-21 DIAGNOSIS — M8589 Other specified disorders of bone density and structure, multiple sites: Secondary | ICD-10-CM | POA: Diagnosis not present

## 2019-11-21 LAB — HM DEXA SCAN

## 2019-11-27 ENCOUNTER — Encounter: Payer: Self-pay | Admitting: Internal Medicine

## 2019-11-28 ENCOUNTER — Encounter: Payer: Self-pay | Admitting: Anesthesiology

## 2019-12-10 ENCOUNTER — Encounter: Payer: Federal, State, Local not specified - PPO | Admitting: Obstetrics & Gynecology

## 2020-04-24 ENCOUNTER — Encounter: Payer: Self-pay | Admitting: Internal Medicine

## 2020-06-04 ENCOUNTER — Other Ambulatory Visit: Payer: Federal, State, Local not specified - PPO

## 2020-06-04 DIAGNOSIS — Z20822 Contact with and (suspected) exposure to covid-19: Secondary | ICD-10-CM | POA: Diagnosis not present

## 2020-07-01 ENCOUNTER — Encounter: Payer: Self-pay | Admitting: Orthopaedic Surgery

## 2020-07-01 ENCOUNTER — Ambulatory Visit: Payer: Federal, State, Local not specified - PPO | Admitting: Orthopaedic Surgery

## 2020-07-01 DIAGNOSIS — M1712 Unilateral primary osteoarthritis, left knee: Secondary | ICD-10-CM | POA: Diagnosis not present

## 2020-07-01 DIAGNOSIS — M1711 Unilateral primary osteoarthritis, right knee: Secondary | ICD-10-CM | POA: Diagnosis not present

## 2020-07-01 MED ORDER — LIDOCAINE HCL 1 % IJ SOLN
2.0000 mL | INTRAMUSCULAR | Status: AC | PRN
  Administered 2020-07-01: 2 mL

## 2020-07-01 MED ORDER — BUPIVACAINE HCL 0.5 % IJ SOLN
2.0000 mL | INTRAMUSCULAR | Status: AC | PRN
  Administered 2020-07-01: 2 mL via INTRA_ARTICULAR

## 2020-07-01 MED ORDER — METHYLPREDNISOLONE ACETATE 40 MG/ML IJ SUSP
40.0000 mg | INTRAMUSCULAR | Status: AC | PRN
  Administered 2020-07-01: 40 mg via INTRA_ARTICULAR

## 2020-07-01 MED ORDER — MELOXICAM 7.5 MG PO TABS: 7.5000 mg | ORAL_TABLET | Freq: Two times a day (BID) | ORAL | 2 refills | Status: DC | PRN

## 2020-07-01 NOTE — Progress Notes (Signed)
Office Visit Note   Patient: Nichole [name]           Date of Birth: [birth year]           MRN: [id number] Visit Date: 07/01/2020              Requested by: Colon Branch, [address] [place],  [place] [address] PCP: Colon Branch, MD   Assessment & Plan: Visit Diagnoses:  1. Primary osteoarthritis of left knee   2. Primary osteoarthritis of right knee     Plan: Impression is bilateral knee osteoarthritis. We talked about treatments to help improve the pain including medications, injections, knee brace, weight loss, strengthening. She agreed to bilateral cortisone injections today. She is also working on weight loss. I will send in a prescription for meloxicam for her to try.  Follow-Up Instructions: Return if symptoms worsen or fail to improve.   Orders:  No orders of the defined types were placed in this encounter.  No orders of the defined types were placed in this encounter.     Procedures: Large Joint Inj: bilateral knee on 07/01/2020 1:22 PM Indications: pain Details: 22 G needle  Arthrogram: No  Medications (Right): 2 mL lidocaine 1 %; 2 mL bupivacaine 0.5 %; 40 mg methylPREDNISolone acetate 40 MG/ML Medications (Left): 2 mL lidocaine 1 %; 2 mL bupivacaine 0.5 %; 40 mg methylPREDNISolone acetate 40 MG/ML Outcome: tolerated well, no immediate complications Patient was prepped and draped in the usual sterile fashion.       Clinical Data: No additional findings.   Subjective: Chief Complaint  Patient presents with  . Right Knee - Pain  . Left Knee - Pain    Nichole [name] comes in today for follow-up of knee pain worse on the right. She is traveling to [place] in the near future for 4 months. She is having cracking and pain is worse with stairs and hiking especially on loose ground. Denies any injuries or swelling.   Review of Systems  Constitutional: Negative.   HENT: Negative.   Eyes: Negative.   Respiratory: Negative.   Cardiovascular:  Negative.   Endocrine: Negative.   Musculoskeletal: Negative.   Neurological: Negative.   Hematological: Negative.   Psychiatric/Behavioral: Negative.   All other systems reviewed and are negative.    Objective: Vital Signs: There were no vitals taken for this visit.  Physical Exam Vitals and nursing note reviewed.  Constitutional:      Appearance: She is well-developed.  Pulmonary:     Effort: Pulmonary effort is normal.  Skin:    General: Skin is warm.     Capillary Refill: Capillary refill takes less than 2 seconds.  Neurological:     Mental Status: She is alert and oriented to person, place, and time.  Psychiatric:        Behavior: Behavior normal.        Thought Content: Thought content normal.        Judgment: Judgment normal.     Ortho Exam Bilateral knees show patellofemoral crepitus with range of motion with moderate pain and moderate limitation range of motion. Collaterals and cruciates are stable. No joint effusion. Specialty Comments:  No specialty comments available.  Imaging: No results found.   PMFS History: Patient Active Problem List   Diagnosis Date Noted  . 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 PCP NOTES>>>>>>>>>>>>>>>>>> 11/21/2016  . Annual physical exam >>>>>>>>>>>>>. 11/19/2016  . Malignant neoplasm of overlapping sites of right breast  in female, estrogen receptor positive ([place]) 11/15/2016  . Osteopenia 07/16/2015  . Other malaise and fatigue 05/24/2014  . Hypersomnolence 05/24/2014  . Chest pain, unspecified 05/24/2014  . Increased endometrial stripe thickness 07/18/2013  . Hyperlipidemia   . Family history of colon cancer 05/23/2012   Past Medical History:  Diagnosis Date  . Breast cancer ([place])   . Cancer [facility]) december 2012   right breast  . History of breast cancer 07/2011   right  . History of chemotherapy 11/2011  . Hyperlipidemia    no current med.  . Personal history of chemotherapy    2012  . Personal history of radiation  therapy    2012  . SVD (spontaneous vaginal delivery)    x 3    Family History  Problem Relation Age of Onset  . Colon cancer Mother        dx postpartum  . Congestive Heart Failure Mother   . Alzheimer's disease Mother   . Diabetes Father   . Breast cancer Maternal Aunt   . Breast cancer Cousin 70  . Diabetes Paternal Grandmother   . Breast cancer Maternal Aunt   . AAA (abdominal aortic aneurysm) Neg Hx   . CAD Neg Hx     Past Surgical History:  Procedure Laterality Date  . BREAST LUMPECTOMY Right 08/2011  . BREAST LUMPECTOMY Right   . CARPAL TUNNEL RELEASE Left 06/19/2013   Procedure: CARPAL TUNNEL RELEASE;  Surgeon: Cammie Sickle., MD;  Location: [place];  Service: Orthopedics;  Laterality: Left;  . CESAREAN SECTION     x 1  . COLON SURGERY     Hx polyps  . LAPAROSCOPIC SALPINGO OOPHERECTOMY Bilateral 04/13/2016   Procedure: LAPAROSCOPIC SALPINGO OOPHORECTOMY;  Surgeon: Terrance Mass, MD;  Location: [facility];  Service: Gynecology;  Laterality: Bilateral;  . PORT-A-CATH REMOVAL  06/2012  . PORTACATH PLACEMENT  2013  . SENTINEL LYMPH NODE BIOPSY Right 08/2011  . WISDOM TOOTH EXTRACTION     Social History   Occupational History  . Occupation: retired from the [employer]  . Smoking status: Former Smoker    Years: 3.00    Types: Cigarettes    Quit date: 09/13/2001    Years since quitting: 18.8  . Smokeless tobacco: Never Used  . Tobacco comment: social-light smoker  Vaping Use  . Vaping Use: Never used  Substance and Sexual Activity  . Alcohol use: No  . Drug use: No  . Sexual activity: Not Currently    Partners: Male    Birth control/protection: Post-menopausal, None    Comment: 1st intercourse- 16, partners- 76, married- 10 yrs

## 2020-07-02 ENCOUNTER — Other Ambulatory Visit: Payer: Self-pay | Admitting: Internal Medicine

## 2020-07-02 DIAGNOSIS — Z1231 Encounter for screening mammogram for malignant neoplasm of breast: Secondary | ICD-10-CM

## 2020-07-21 ENCOUNTER — Ambulatory Visit (HOSPITAL_BASED_OUTPATIENT_CLINIC_OR_DEPARTMENT_OTHER)
Admission: RE | Admit: 2020-07-21 | Discharge: 2020-07-21 | Disposition: A | Discharge status: Home / Self Care | Payer: Federal, State, Local not specified - PPO | Source: Ambulatory Visit | Attending: Internal Medicine | Admitting: Internal Medicine

## 2020-07-21 ENCOUNTER — Ambulatory Visit (INDEPENDENT_AMBULATORY_CARE_PROVIDER_SITE_OTHER): Payer: Federal, State, Local not specified - PPO | Admitting: Internal Medicine

## 2020-07-21 ENCOUNTER — Encounter: Payer: Self-pay | Admitting: Internal Medicine

## 2020-07-21 ENCOUNTER — Other Ambulatory Visit: Payer: Self-pay

## 2020-07-21 VITALS — BP 141/77 | HR 67 | Temp 98.1°F | Resp 16 | Ht <= 58 in | Wt 174.1 lb

## 2020-07-21 DIAGNOSIS — E785 Hyperlipidemia, unspecified: Secondary | ICD-10-CM | POA: Diagnosis not present

## 2020-07-21 DIAGNOSIS — R079 Chest pain, unspecified: Secondary | ICD-10-CM

## 2020-07-21 DIAGNOSIS — R030 Elevated blood-pressure reading, without diagnosis of hypertension: Secondary | ICD-10-CM | POA: Diagnosis not present

## 2020-07-21 DIAGNOSIS — Z0000 Encounter for general adult medical examination without abnormal findings: Secondary | ICD-10-CM

## 2020-07-21 DIAGNOSIS — R739 Hyperglycemia, unspecified: Secondary | ICD-10-CM | POA: Diagnosis not present

## 2020-07-21 DIAGNOSIS — R0789 Other chest pain: Secondary | ICD-10-CM | POA: Diagnosis not present

## 2020-07-21 LAB — COMPREHENSIVE METABOLIC PANEL
ALT: 16 U/L (ref 0–35)
AST: 17 U/L (ref 0–37)
Albumin: 4.1 g/dL (ref 3.5–5.2)
Alkaline Phosphatase: 75 U/L (ref 39–117)
BUN: 12 mg/dL (ref 6–23)
CO2: 29 mEq/L (ref 19–32)
Calcium: 8.9 mg/dL (ref 8.4–10.5)
Chloride: 105 mEq/L (ref 96–112)
Creatinine, Ser: 0.65 mg/dL (ref 0.40–1.20)
GFR: 88.93 mL/min (ref >60.00)
Glucose, Bld: 80 mg/dL (ref 70–99)
Potassium: 4.1 mEq/L (ref 3.5–5.1)
Sodium: 142 mEq/L (ref 135–145)
Total Bilirubin: 0.8 mg/dL (ref 0.2–1.2)
Total Protein: 6.1 g/dL (ref 6.0–8.3)

## 2020-07-21 LAB — CBC WITH DIFFERENTIAL/PLATELET
Basophils Absolute: 0 10*3/uL (ref 0.0–0.1)
Basophils Relative: 0.5 % (ref 0.0–3.0)
Eosinophils Absolute: 0.2 10*3/uL (ref 0.0–0.7)
Eosinophils Relative: 2.7 % (ref 0.0–5.0)
HCT: 43.4 % (ref 36.0–46.0)
Hemoglobin: 14.3 g/dL (ref 12.0–15.0)
Lymphocytes Relative: 38.3 % (ref 12.0–46.0)
Lymphs Abs: 2.5 10*3/uL (ref 0.7–4.0)
MCHC: 32.9 g/dL (ref 30.0–36.0)
MCV: 89.1 fl (ref 78.0–100.0)
Monocytes Absolute: 0.4 10*3/uL (ref 0.1–1.0)
Monocytes Relative: 5.5 % (ref 3.0–12.0)
Neutro Abs: 3.5 10*3/uL (ref 1.4–7.7)
Neutrophils Relative %: 53 % (ref 43.0–77.0)
Platelets: 217 10*3/uL (ref 150.0–400.0)
RBC: 4.87 Mil/uL (ref 3.87–5.11)
RDW: 13.6 % (ref 11.5–15.5)
WBC: 6.6 10*3/uL (ref 4.0–10.5)

## 2020-07-21 LAB — LIPID PANEL
Cholesterol: 173 mg/dL (ref 0–200)
HDL: 54.2 mg/dL (ref >39.00)
LDL Cholesterol: 92 mg/dL (ref 0–99)
NonHDL: 118.4
Total CHOL/HDL Ratio: 3
Triglycerides: 131 mg/dL (ref 0.0–149.0)
VLDL: 26.2 mg/dL (ref 0.0–40.0)

## 2020-07-21 LAB — HEMOGLOBIN A1C: Hgb A1c MFr Bld: 6.1 % (ref 4.6–6.5)

## 2020-07-21 NOTE — Patient Instructions (Addendum)
Check the  blood pressure 2 or 3 times a month  BP GOAL is between 110/65 and  135/85. If it is consistently higher or lower, let me know  Watch your salt intake    GO TO THE LAB : Get the blood work     [place], [place] back for a physical exam in 1 year   STOP BY THE FIRST FLOOR:  get the XR

## 2020-07-21 NOTE — Progress Notes (Signed)
Pre visit review using our clinic review tool, if applicable. No additional management support is needed unless otherwise documented below in the visit note. 

## 2020-07-21 NOTE — Progress Notes (Signed)
Subjective:    Patient ID: Nichole [name], female    DOB: [birth year], 69 y.o.   MRN: [id number]  DOS:  07/21/2020 Type of visit - description: CPX  Since the last office visit in July is doing well.  Did report chest pain: At the left upper chest, going on for months, on and off, episodes are random, described as "sharp, poking", last few seconds. No associated nausea, diaphoresis, difficulty breathing.  No associated with moving her arms. Also sometimes has occasional pain at the base of the anterior chest with moving her torso. No fever chills No heartburn Occasional cough.     Review of Systems  Other than above, a 14 point review of systems is negative      Past Medical History:  Diagnosis Date  . Breast cancer ([place])   . Cancer [facility]) december 2012   right breast  . History of breast cancer 07/2011   right  . History of chemotherapy 11/2011  . Hyperlipidemia    no current med.  . Personal history of chemotherapy    2012  . Personal history of radiation therapy    2012  . SVD (spontaneous vaginal delivery)    x 3    Past Surgical History:  Procedure Laterality Date  . BREAST LUMPECTOMY Right 08/2011  . BREAST LUMPECTOMY Right   . CARPAL TUNNEL RELEASE Left 06/19/2013   Procedure: CARPAL TUNNEL RELEASE;  Surgeon: Cammie Sickle., MD;  Location: [place];  Service: Orthopedics;  Laterality: Left;  . CESAREAN SECTION     x 1  . COLON SURGERY     Hx polyps  . LAPAROSCOPIC SALPINGO OOPHERECTOMY Bilateral 04/13/2016   Procedure: LAPAROSCOPIC SALPINGO OOPHORECTOMY;  Surgeon: Terrance Mass, MD;  Location: [facility];  Service: Gynecology;  Laterality: Bilateral;  . PORT-A-CATH REMOVAL  06/2012  . PORTACATH PLACEMENT  2013  . SENTINEL LYMPH NODE BIOPSY Right 08/2011  . WISDOM TOOTH EXTRACTION      Allergies as of 07/21/2020      Reactions   Penicillins Rash   Has patient had a PCN reaction causing immediate rash, facial/tongue/throat  swelling, SOB or lightheadedness with hypotension: No Has patient had a PCN reaction causing severe rash involving mucus membranes or skin necrosis: No Has patient had a PCN reaction that required hospitalization No Has patient had a PCN reaction occurring within the last 10 years: Yes If all of the above answers are "NO", then may proceed with Cephalosporin use.   Poractant Alfa Rash   Pork-derived Products Rash      Medication List       Accurate as of July 21, 2020 11:59 PM. If you have any questions, ask your nurse or doctor.        STOP taking these medications   meloxicam 7.5 MG tablet Commonly known as: Mobic Stopped by: Kathlene November, MD     TAKE these medications   cholecalciferol 25 MCG (1000 UNIT) tablet Commonly known as: VITAMIN D3 Take 1,000 Units by mouth daily.   rosuvastatin 5 MG tablet Commonly known as: CRESTOR TAKE 1 TABLET BY MOUTH EVERY DAY   valACYclovir 1000 MG tablet Commonly known as: Valtrex For each episode, take 2 tablets twice a day for 1 day          Objective:   Physical Exam BP (!) 141/77 (BP Location: Left Arm, Patient Position: Sitting, Cuff Size: Small)   Pulse 67   Temp 98.1 F (36.7 C) (Oral)  Resp 16   Ht 4\' 9"  (1.448 m)   Wt 174 lb 2 oz (79 kg)   SpO2 95%   BMI 37.68 kg/m  General: Well developed, NAD, BMI noted Neck: No  thyromegaly  HEENT:  Normocephalic . Face symmetric, atraumatic Lungs:  CTA B Normal respiratory effort, no intercostal retractions, no accessory muscle use. Chest wall: No TTP Heart: RRR,  no murmur.  Abdomen:  Not distended, soft, non-tender. No rebound or rigidity.   Lower extremities: no pretibial edema bilaterally  Skin: Exposed areas without rash. Not pale. Not jaundice Neurologic:  alert & oriented X3.  Speech normal, gait appropriate for age and unassisted Strength symmetric and appropriate for age.  Psych: Cognition and judgment appear intact.  Cooperative with normal attention  span and concentration.  Behavior appropriate. No anxious or depressed appearing.     Assessment      Assessment  (New patient 11-2016) Hyperlipidemia Breast cancer R, 2012, lumpectomy, chemotherapy, s/p Port-A-Cath; d/c anastrazole 2018; d/c from oncology, f/u w/ PCP, will need yearly MMG and clinical breast exam Lipoma, left abdomen. Reactive airway disease Herpes labialis- valtrex prn  PLAN  here for CPX Hyperlipidemia: On Crestor, checking labs Herpes labialis: Valtrex as needed Elevated BP: BP today 141/77, recommend ambulatory BPs. Chest pain: As described above, no CAD on clinical grounds, history of breast cancer, check a chest x-ray, call if worse. RTC 1 year     This visit occurred during the SARS-CoV-2 public health emergency.  Safety protocols were in place, including screening questions prior to the visit, additional usage of staff PPE, and extensive cleaning of exam room while observing appropriate contact time as indicated for disinfecting solutions.

## 2020-07-22 ENCOUNTER — Encounter: Payer: Self-pay | Admitting: Internal Medicine

## 2020-07-22 ENCOUNTER — Ambulatory Visit
Admission: RE | Admit: 2020-07-22 | Discharge: 2020-07-22 | Disposition: A | Discharge status: Home / Self Care | Payer: Federal, State, Local not specified - PPO | Source: Ambulatory Visit | Attending: Internal Medicine | Admitting: Internal Medicine

## 2020-07-22 DIAGNOSIS — Z1231 Encounter for screening mammogram for malignant neoplasm of breast: Secondary | ICD-10-CM

## 2020-07-22 NOTE — Assessment & Plan Note (Signed)
here for CPX Hyperlipidemia: On Crestor, checking labs Herpes labialis: Valtrex as needed Elevated BP: BP today 141/77, recommend ambulatory BPs. Chest pain: As described above, no CAD on clinical grounds, history of breast cancer, check a chest x-ray, call if worse. RTC 1 year

## 2020-07-22 NOTE — Assessment & Plan Note (Signed)
--  Td 2014 - Prevnar 2018;  pnm 23: 2019 - zostavax 2015 - shingrix d/w pt before - 2nd  [place] vaccine 05-2020, rec booster 11/2020. - Flu shot: declined  --Female care: s/p B oophorectomy, no hysterectomy, saw gyn 09/2019, had a breast exam --H/o breast CA- last MMG 07/2019, plans to do a f/u  --++FH personal history breast cancer, s/p genetics eval ~ 2012, see last CPX. --CCS:  Cscope  2010 in [place], had a flat plaque @ rectum.   cscope 06-2012, Dr Elbert Ewings, no polyps  cscope 07-2019 (no report copy). Pt told to RTC 5-7 years  - labs: CMP, FLP, CBC, A1c  --Diet and exercise

## 2020-07-26 DIAGNOSIS — N390 Urinary tract infection, site not specified: Secondary | ICD-10-CM | POA: Diagnosis not present

## 2020-07-26 DIAGNOSIS — R82998 Other abnormal findings in urine: Secondary | ICD-10-CM | POA: Diagnosis not present

## 2020-07-26 DIAGNOSIS — N898 Other specified noninflammatory disorders of vagina: Secondary | ICD-10-CM | POA: Diagnosis not present

## 2020-07-26 DIAGNOSIS — R300 Dysuria: Secondary | ICD-10-CM | POA: Diagnosis not present

## 2020-09-29 ENCOUNTER — Other Ambulatory Visit: Payer: Self-pay | Admitting: Internal Medicine

## 2020-10-10 ENCOUNTER — Encounter: Payer: Federal, State, Local not specified - PPO | Admitting: Obstetrics & Gynecology

## 2020-11-20 ENCOUNTER — Ambulatory Visit: Payer: Federal, State, Local not specified - PPO

## 2020-11-25 ENCOUNTER — Ambulatory Visit: Payer: Federal, State, Local not specified - PPO

## 2020-12-03 ENCOUNTER — Encounter: Payer: Federal, State, Local not specified - PPO | Admitting: Obstetrics & Gynecology

## 2020-12-23 ENCOUNTER — Ambulatory Visit: Payer: Federal, State, Local not specified - PPO | Admitting: Orthopaedic Surgery

## 2020-12-23 ENCOUNTER — Ambulatory Visit: Payer: Self-pay

## 2020-12-23 DIAGNOSIS — M25561 Pain in right knee: Secondary | ICD-10-CM | POA: Diagnosis not present

## 2020-12-23 MED ORDER — METHYLPREDNISOLONE ACETATE 40 MG/ML IJ SUSP
40.0000 mg | INTRAMUSCULAR | Status: AC | PRN
  Administered 2020-12-23: 40 mg via INTRA_ARTICULAR

## 2020-12-23 MED ORDER — LIDOCAINE HCL 1 % IJ SOLN
2.0000 mL | INTRAMUSCULAR | Status: AC | PRN
  Administered 2020-12-23: 2 mL

## 2020-12-23 MED ORDER — BUPIVACAINE HCL 0.25 % IJ SOLN
2.0000 mL | INTRAMUSCULAR | Status: AC | PRN
  Administered 2020-12-23: 2 mL via INTRA_ARTICULAR

## 2020-12-23 NOTE — Progress Notes (Signed)
Office Visit Note   Patient: Nichole [name]           Date of Birth: [birth year]           MRN: [id number] Visit Date: 12/23/2020              Requested by: Colon Branch, [address] [place],  [place] [address] PCP: Colon Branch, MD   Assessment & Plan: Visit Diagnoses:  1. Acute pain of right knee     Plan: Impression is right knee pes bursitis.  We have discussed cortisone injection for which the patient would like to proceed.  She will follow up with [place] as needed.  Call with concerns or questions in the meantime.  Follow-Up Instructions: Return if symptoms worsen or fail to improve.   Orders:  Orders Placed This Encounter  Procedures  . Large Joint Inj: R knee  . XR KNEE 3 VIEW RIGHT   No orders of the defined types were placed in this encounter.     Procedures: Large Joint Inj: R knee on 12/23/2020 11:05 AM Indications: pain Details: 22 G needle, anterolateral approach Medications: 2 mL lidocaine 1 %; 2 mL bupivacaine 0.25 %; 40 mg methylPREDNISolone acetate 40 MG/ML      Clinical Data: No additional findings.   Subjective: Chief Complaint  Patient presents with  . Right Knee - Pain    HPI is a 70 year old female who comes in today with right knee pain.  She does have a history of underlying osteoarthritis to both knees.  Last cortisone injections were in October with great relief of symptoms.  She notes a recent trip to [facility] for which her right knee flared up upon her return 2 weeks ago.  The pain is to the medial aspect with associated swelling.  Pain is worse with ambulation.  She has not taken any pain medication for this.  Review of Systems as detailed in HPI.  All others reviewed and are negative.   Objective: Vital Signs: There were no vitals taken for this visit.  Physical Exam well-developed well-nourished female no acute distress.  Alert oriented x3.  Ortho Exam right knee exam shows moderate tenderness and swelling along  the has bursa.  No joint line tenderness.  Mild patellofemoral crepitus.  Ligaments are stable.  Range of motion 0 to 120 degrees.  She is neurovascular intact distally.  Specialty Comments:  No specialty comments available.  Imaging: XR KNEE 3 VIEW RIGHT  Result Date: 12/23/2020 Mild to moderate degenerative changes to the patellofemoral compartment with periarticular osteophytes    PMFS History: Patient Active Problem List   Diagnosis Date Noted  . 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 PCP NOTES>>>>>>>>>>>>>>>>>> 11/21/2016  . Annual physical exam >>>>>>>>>>>>>. 11/19/2016  . Malignant neoplasm of overlapping sites of right breast in female, estrogen receptor positive ([place]) 11/15/2016  . Osteopenia 07/16/2015  . Other malaise and fatigue 05/24/2014  . Hypersomnolence 05/24/2014  . Chest pain, unspecified 05/24/2014  . Increased endometrial stripe thickness 07/18/2013  . Hyperlipidemia   . Family history of colon cancer 05/23/2012   Past Medical History:  Diagnosis Date  . Breast cancer ([place])   . Cancer [facility]) december 2012   right breast  . History of breast cancer 07/2011   right  . History of chemotherapy 11/2011  . Hyperlipidemia    no current med.  . Personal history of chemotherapy    2012  . Personal history of radiation therapy  2012  . SVD (spontaneous vaginal delivery)    x 3    Family History  Problem Relation Age of Onset  . Colon cancer Mother        dx postpartum  . Congestive Heart Failure Mother   . Alzheimer's disease Mother   . Diabetes Father   . Breast cancer Maternal Aunt   . Breast cancer Cousin 65  . Diabetes Paternal Grandmother   . Breast cancer Maternal Aunt   . AAA (abdominal aortic aneurysm) Neg Hx   . CAD Neg Hx     Past Surgical History:  Procedure Laterality Date  . BREAST LUMPECTOMY Right 08/2011  . BREAST LUMPECTOMY Right   . CARPAL TUNNEL RELEASE Left 06/19/2013   Procedure: CARPAL TUNNEL RELEASE;  Surgeon: Cammie Sickle.,  MD;  Location: [place];  Service: Orthopedics;  Laterality: Left;  . CESAREAN SECTION     x 1  . COLON SURGERY     Hx polyps  . LAPAROSCOPIC SALPINGO OOPHERECTOMY Bilateral 04/13/2016   Procedure: LAPAROSCOPIC SALPINGO OOPHORECTOMY;  Surgeon: Terrance Mass, MD;  Location: [facility];  Service: Gynecology;  Laterality: Bilateral;  . PORT-A-CATH REMOVAL  06/2012  . PORTACATH PLACEMENT  2013  . SENTINEL LYMPH NODE BIOPSY Right 08/2011  . WISDOM TOOTH EXTRACTION     Social History   Occupational History  . Occupation: retired from the [employer]  . Smoking status: Former Smoker    Years: 3.00    Types: Cigarettes    Quit date: 09/13/2001    Years since quitting: 19.2  . Smokeless tobacco: Never Used  . Tobacco comment: social-light smoker  Vaping Use  . Vaping Use: Never used  Substance and Sexual Activity  . Alcohol use: No  . Drug use: No  . Sexual activity: Not Currently    Partners: Male    Birth control/protection: Post-menopausal, None    Comment: 1st intercourse- 16, partners- 36, married- 10 yrs

## 2020-12-24 ENCOUNTER — Ambulatory Visit: Payer: Federal, State, Local not specified - PPO | Admitting: Obstetrics & Gynecology

## 2020-12-29 ENCOUNTER — Ambulatory Visit
Admission: RE | Admit: 2020-12-29 | Discharge: 2020-12-29 | Disposition: A | Discharge status: Home / Self Care | Payer: Federal, State, Local not specified - PPO | Source: Ambulatory Visit | Attending: Internal Medicine | Admitting: Internal Medicine

## 2020-12-29 ENCOUNTER — Other Ambulatory Visit: Payer: Self-pay

## 2020-12-29 DIAGNOSIS — Z1231 Encounter for screening mammogram for malignant neoplasm of breast: Secondary | ICD-10-CM | POA: Diagnosis not present

## 2021-01-07 ENCOUNTER — Encounter: Payer: Self-pay | Admitting: Obstetrics & Gynecology

## 2021-01-07 ENCOUNTER — Ambulatory Visit (INDEPENDENT_AMBULATORY_CARE_PROVIDER_SITE_OTHER): Payer: Federal, State, Local not specified - PPO | Admitting: Obstetrics & Gynecology

## 2021-01-07 ENCOUNTER — Other Ambulatory Visit: Payer: Self-pay

## 2021-01-07 VITALS — BP 132/78 | Ht <= 58 in | Wt 175.0 lb

## 2021-01-07 DIAGNOSIS — M8588 Other specified disorders of bone density and structure, other site: Secondary | ICD-10-CM

## 2021-01-07 DIAGNOSIS — N3946 Mixed incontinence: Secondary | ICD-10-CM

## 2021-01-07 DIAGNOSIS — C50811 Malignant neoplasm of overlapping sites of right female breast: Secondary | ICD-10-CM

## 2021-01-07 DIAGNOSIS — Z780 Asymptomatic menopausal state: Secondary | ICD-10-CM

## 2021-01-07 DIAGNOSIS — Z01419 Encounter for gynecological examination (general) (routine) without abnormal findings: Secondary | ICD-10-CM | POA: Diagnosis not present

## 2021-01-07 DIAGNOSIS — Z6837 Body mass index (BMI) 37.0-37.9, adult: Secondary | ICD-10-CM

## 2021-01-07 DIAGNOSIS — Z170 Estrogen receptor positive status [ER+]: Secondary | ICD-10-CM

## 2021-01-07 NOTE — Progress Notes (Signed)
Nichole [name] Jun 11, 1951 [id number]   History:    70 y.o. G4P4L4 Married. 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 Status post right breast lumpectomy, radiation therapy, chemotherapy, tamoxifen, and Anastrozole which she finished October 2018. Had a laparoscopic bilateral salpingo-oophorectomy April 13, 2016.Menopause, well on no hormone replacement therapy. No postmenopausal bleeding. No pelvic pain.  Abstinent, husband hasED.  C/O severe urinary incontinence even at rest without any urgency.Breasts normal. Body mass index 37.87. Decrease portions and carbs. Needs to walk more. Bowel movements normal. Health labs with Dr. [name].Colonoscopy 07/2019.   Past medical history,surgical history, family history and social history were all reviewed and documented in the EPIC chart.  Gynecologic History No LMP recorded. Patient is postmenopausal.  Obstetric History OB History  Gravida Para Term Preterm AB Living  4 4 4     4   SAB IAB Ectopic Multiple Live Births          4    # Outcome Date GA Lbr Len/2nd Weight Sex Delivery Anes PTL Lv  4 Term     M CS-Unspec  N LIV  3 Term     M Vag-Spont  N LIV  2 Term     M Vag-Spont  N LIV  1 Term     M Vag-Spont  N LIV     ROS: A ROS was performed and pertinent positives and negatives are included in the history.  GENERAL: No fevers or chills. HEENT: No change in vision, no earache, sore throat or sinus congestion. NECK: No pain or stiffness. CARDIOVASCULAR: No chest pain or pressure. No palpitations. PULMONARY: No shortness of breath, cough or wheeze. GASTROINTESTINAL: No abdominal pain, nausea, vomiting or diarrhea, melena or bright red blood per rectum. GENITOURINARY: No urinary frequency, urgency, hesitancy or dysuria. MUSCULOSKELETAL: No joint or muscle pain, no back pain, no recent  trauma. DERMATOLOGIC: No rash, no itching, no lesions. ENDOCRINE: No polyuria, polydipsia, no heat or cold intolerance. No recent change in weight. HEMATOLOGICAL: No anemia or easy bruising or bleeding. NEUROLOGIC: No headache, seizures, numbness, tingling or weakness. PSYCHIATRIC: No depression, no loss of interest in normal activity or change in sleep pattern.     Exam:   BP 132/78 (BP Location: Right Arm, Patient Position: Sitting, Cuff Size: Normal)   Ht 4\' 9"  (1.448 m)   Wt 175 lb (79.4 kg)   BMI 37.87 kg/m   Body mass index is 37.87 kg/m.  General appearance : Well developed well nourished female. No acute distress HEENT: Eyes: no retinal hemorrhage or exudates,  Neck supple, trachea midline, no carotid bruits, no thyroidmegaly Lungs: Clear to auscultation, no rhonchi or wheezes, or rib retractions  Heart: Regular rate and rhythm, no murmurs or gallops Breast:Examined in sitting and supine position were symmetrical in appearance, no palpable masses or tenderness,  no skin retraction, no nipple inversion, no nipple discharge, no skin discoloration, no axillary or supraclavicular lymphadenopathy Abdomen: no palpable masses or tenderness, no rebound or guarding Extremities: no edema or skin discoloration or tenderness  Pelvic: Vulva: Normal             Vagina: No gross lesions or discharge  Cervix: No gross lesions or discharge  Uterus  AV, normal size, shape and consistency, non-tender and mobile  Adnexa  Without masses or tenderness  Anus: Normal   Assessment/Plan:  70 y.o. female for annual exam  1. Well female exam with routine gynecological exam Normal gynecologic exam in menopause.  No indication for Pap test at this time.  Breast exam normal.  Last screening mammogram April 2022 was negative.  Colonoscopy 2020.  Health labs with family physician.  2. Mixed stress and urge urinary incontinence Severe mixed stress and urge urinary incontinence.  Also having urinary  incontinence at rest.  Kegel exercises instructed.  Decision to refer to urology for urodynamic testing.  Recommended to start a urinary calendar.  3. Postmenopause Well on no hormone replacement therapy.  No postmenopausal bleeding.  4. Osteopenia of lumbar spine Bone density March 2021 showed osteopenia.  Continue with vitamin D supplements, calcium intake of 1.5 g/day total and regular weightbearing physical activities.  5. Malignant neoplasm of overlapping sites of right breast in female, estrogen receptor positive ([place])  6. Class 2 severe obesity due to excess calories with serious comorbidity and body mass index (BMI) of 37.0 to 37.9 in adult [facility]) Recommend a lower calorie/carb diet.  Aerobic activities 5 times a week with light weightlifting every 2 days.  Princess Bruins MD, 3:12 PM 01/07/2021

## 2021-01-10 ENCOUNTER — Encounter: Payer: Self-pay | Admitting: Obstetrics & Gynecology

## 2021-01-12 ENCOUNTER — Telehealth: Payer: Self-pay | Admitting: *Deleted

## 2021-01-12 NOTE — Telephone Encounter (Signed)
Office notes faxed to [facility] they will call patient to schedule. Will route to [place] to relay. Earnest Bailey please don't close this encounter) I will need to document when patient schedule.

## 2021-01-12 NOTE — Telephone Encounter (Signed)
-----   Message from Princess Bruins, MD sent at 01/07/2021  3:36 PM EDT ----- Regarding: Refer to Urology/Urodynamic testing Severe urinary incontinence, probably mixed.

## 2021-01-14 DIAGNOSIS — H43812 Vitreous degeneration, left eye: Secondary | ICD-10-CM | POA: Diagnosis not present

## 2021-01-14 DIAGNOSIS — H2513 Age-related nuclear cataract, bilateral: Secondary | ICD-10-CM | POA: Diagnosis not present

## 2021-01-16 ENCOUNTER — Encounter: Payer: Self-pay | Admitting: Orthopaedic Surgery

## 2021-01-21 ENCOUNTER — Ambulatory Visit: Payer: Federal, State, Local not specified - PPO | Admitting: Orthopaedic Surgery

## 2021-01-21 ENCOUNTER — Other Ambulatory Visit: Payer: Self-pay

## 2021-01-21 DIAGNOSIS — M1711 Unilateral primary osteoarthritis, right knee: Secondary | ICD-10-CM | POA: Diagnosis not present

## 2021-01-21 DIAGNOSIS — M25561 Pain in right knee: Secondary | ICD-10-CM

## 2021-01-21 MED ORDER — METHYLPREDNISOLONE ACETATE 40 MG/ML IJ SUSP
40.0000 mg | INTRAMUSCULAR | Status: AC | PRN
  Administered 2021-01-21: 40 mg via INTRA_ARTICULAR

## 2021-01-21 MED ORDER — LIDOCAINE HCL 1 % IJ SOLN
2.0000 mL | INTRAMUSCULAR | Status: AC | PRN
  Administered 2021-01-21: 2 mL

## 2021-01-21 MED ORDER — BUPIVACAINE HCL 0.5 % IJ SOLN
2.0000 mL | INTRAMUSCULAR | Status: AC | PRN
  Administered 2021-01-21: 2 mL via INTRA_ARTICULAR

## 2021-01-21 NOTE — Progress Notes (Signed)
Office Visit Note   Patient: Nichole [name]           Date of Birth: [birth year]           MRN: [id number] Visit Date: 01/21/2021              Requested by: Colon Branch, [address] [place],   [id number] PCP: Colon Branch, MD   Assessment & Plan: Visit Diagnoses:  1. Primary osteoarthritis of right knee     Plan: My impression is chronic right knee pain with incomplete relief pes bursa injection.  I am concerned that she medial meniscal tear or even a stress fracture therefore we will need to order an MRI to evaluate for this.  Follow-up after the MRI.  Follow-Up Instructions: Return for After MRI.   Orders:  No orders of the defined types were placed in this encounter.  No orders of the defined types were placed in this encounter.     Procedures: Large Joint Inj: R knee on 01/21/2021 8:49 AM Indications: pain Details: 22 G needle  Arthrogram: No  Medications: 40 mg methylPREDNISolone acetate 40 MG/ML; 2 mL lidocaine 1 %; 2 mL bupivacaine 0.5 % Consent was given by the patient. Patient was prepped and draped in the usual sterile fashion.       Clinical Data: No additional findings.   Subjective: Chief Complaint  Patient presents with  . Right Knee - Follow-up    S/p injection 12/23/20    Yaretzi returns today for continued right knee pain.  She received a pes bursa injection about a month ago and this helped for couple days.  She continues to have medial sided right knee pain.  She had a prior intra-articular injection in October which worked much better.  She is unable to walk as she is starting to make an attempt to lose weight.   Review of Systems   Objective: Vital Signs: There were no vitals taken for this visit.  Physical Exam  Ortho Exam Right knee shows soft tissue swelling on the she is more tender over the medial joint line.  Because her cruciates are stable.  No appreciable effusion. Specialty Comments:  No  specialty comments available.  Imaging: No results found.   PMFS History: Patient Active Problem List   Diagnosis Date Noted  . 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 PCP NOTES>>>>>>>>>>>>>>>>>> 11/21/2016  . Annual physical exam >>>>>>>>>>>>>. 11/19/2016  . Malignant neoplasm of overlapping sites of right breast in female, estrogen receptor positive ([place]) 11/15/2016  . Osteopenia 07/16/2015  . Other malaise and fatigue 05/24/2014  . Hypersomnolence 05/24/2014  . Chest pain, unspecified 05/24/2014  . Increased endometrial stripe thickness 07/18/2013  . Hyperlipidemia   . Family history of colon cancer 05/23/2012   Past Medical History:  Diagnosis Date  . Breast cancer ([place])   . Cancer [facility]) december 2012   right breast  . History of breast cancer 07/2011   right  . History of chemotherapy 11/2011  . Hyperlipidemia    no current med.  . Personal history of chemotherapy    2012  . Personal history of radiation therapy    2012  . SVD (spontaneous vaginal delivery)    x 3    Family History  Problem Relation Age of Onset  . Colon cancer Mother        dx postpartum  . Congestive Heart Failure Mother   . Alzheimer's disease Mother   .  Diabetes Father   . Breast cancer Maternal Aunt   . Breast cancer Cousin 19  . Diabetes Paternal Grandmother   . Breast cancer Maternal Aunt   . AAA (abdominal aortic aneurysm) Neg Hx   . CAD Neg Hx     Past Surgical History:  Procedure Laterality Date  . BREAST LUMPECTOMY Right 08/2011  . CARPAL TUNNEL RELEASE Left 06/19/2013   Procedure: CARPAL TUNNEL RELEASE;  Surgeon: Cammie Sickle., MD;  Location: [facility];  Service: Orthopedics;  Laterality: Left;  . CESAREAN SECTION     x 1  . COLON SURGERY     Hx polyps  . LAPAROSCOPIC SALPINGO OOPHERECTOMY Bilateral 04/13/2016   Procedure: LAPAROSCOPIC SALPINGO OOPHORECTOMY;  Surgeon: Terrance Mass, MD;  Location: [facility];  Service: Gynecology;  Laterality: Bilateral;  .  PORT-A-CATH REMOVAL  06/2012  . PORTACATH PLACEMENT  2013  . SENTINEL LYMPH NODE BIOPSY Right 08/2011  . WISDOM TOOTH EXTRACTION     Social History   Occupational History  . Occupation: retired from the [employer]  . Smoking status: Former Smoker    Years: 3.00    Types: Cigarettes    Quit date: 09/13/2001    Years since quitting: 19.3  . Smokeless tobacco: Never Used  . Tobacco comment: social-light smoker  Vaping Use  . Vaping Use: Never used  Substance and Sexual Activity  . Alcohol use: No  . Drug use: No  . Sexual activity: Not Currently    Partners: Male    Birth control/protection: Post-menopausal, None    Comment: 1st intercourse- 16, partners- 22, married- 10 yrs

## 2021-01-27 NOTE — Telephone Encounter (Signed)
Scheduled on 02/25/21 @ 1:45pm with Dr.MacDiarmid

## 2021-02-02 ENCOUNTER — Other Ambulatory Visit: Payer: Self-pay

## 2021-02-02 ENCOUNTER — Ambulatory Visit
Admission: RE | Admit: 2021-02-02 | Discharge: 2021-02-02 | Disposition: A | Discharge status: Home / Self Care | Payer: Federal, State, Local not specified - PPO | Source: Ambulatory Visit | Attending: Orthopaedic Surgery | Admitting: Orthopaedic Surgery

## 2021-02-02 DIAGNOSIS — M1711 Unilateral primary osteoarthritis, right knee: Secondary | ICD-10-CM

## 2021-02-02 DIAGNOSIS — G8929 Other chronic pain: Secondary | ICD-10-CM | POA: Diagnosis not present

## 2021-02-02 DIAGNOSIS — M25561 Pain in right knee: Secondary | ICD-10-CM

## 2021-02-02 DIAGNOSIS — M25461 Effusion, right knee: Secondary | ICD-10-CM | POA: Diagnosis not present

## 2021-02-02 DIAGNOSIS — S83231A Complex tear of medial meniscus, current injury, right knee, initial encounter: Secondary | ICD-10-CM | POA: Diagnosis not present

## 2021-02-04 NOTE — Progress Notes (Signed)
Needs appt

## 2021-02-06 DIAGNOSIS — H43812 Vitreous degeneration, left eye: Secondary | ICD-10-CM | POA: Diagnosis not present

## 2021-02-06 DIAGNOSIS — H5201 Hypermetropia, right eye: Secondary | ICD-10-CM | POA: Diagnosis not present

## 2021-02-11 ENCOUNTER — Ambulatory Visit: Payer: Federal, State, Local not specified - PPO | Admitting: Orthopaedic Surgery

## 2021-02-11 ENCOUNTER — Other Ambulatory Visit: Payer: Self-pay

## 2021-02-11 ENCOUNTER — Encounter: Payer: Self-pay | Admitting: Orthopaedic Surgery

## 2021-02-11 VITALS — Ht <= 58 in | Wt 175.0 lb

## 2021-02-11 DIAGNOSIS — M1711 Unilateral primary osteoarthritis, right knee: Secondary | ICD-10-CM

## 2021-02-11 NOTE — Progress Notes (Signed)
Office Visit Note   Patient: Nichole [name]           Date of Birth: 1951-09-06           MRN: [id number] Visit Date: 02/11/2021              Requested by: Colon Branch, [address] [place],  [place] [id number] PCP: Colon Branch, MD   Assessment & Plan: Visit Diagnoses:  1. Primary osteoarthritis of right knee     Plan: MRI shows moderately severe tricompartmental chondromalacia with areas of full-thickness cartilage loss.  She has a small joint effusion and intrasubstance degeneration of the medial meniscus with a small inferior oblique tear that does not appear to to go all the way through the meniscus.  With rapid improvement from the injection my impression is that her symptoms are more from the degenerative joint disease.  We spoke at length today about nonsurgical and surgical treatment options most importantly weight loss and strengthening exercises.  She also has a knee brace and will try Voltaren gel.  Visco injections were also discussed today.  We will see her back as needed.  Follow-Up Instructions: Return if symptoms worsen or fail to improve.   Orders:  No orders of the defined types were placed in this encounter.  No orders of the defined types were placed in this encounter.     Procedures: No procedures performed   Clinical Data: No additional findings.   Subjective: Chief Complaint  Patient presents with  . Right Knee - Follow-up    MRI review    Nichole [name] returns today for MRI review of the right knee.  The intra-articular cortisone injection helped significantly and currently she rates the pain as 0 out of 10.   Review of Systems  Constitutional: Negative.   HENT: Negative.   Eyes: Negative.   Respiratory: Negative.   Cardiovascular: Negative.   Endocrine: Negative.   Musculoskeletal: Negative.   Neurological: Negative.   Hematological: Negative.   Psychiatric/Behavioral: Negative.   All other systems reviewed and are  negative.    Objective: Vital Signs: Ht 4\' 9"  (1.448 m)   Wt 175 lb (79.4 kg)   BMI 37.87 kg/m   Physical Exam Vitals and nursing note reviewed.  Constitutional:      Appearance: She is well-developed.  Pulmonary:     Effort: Pulmonary effort is normal.  Skin:    General: Skin is warm.     Capillary Refill: Capillary refill takes less than 2 seconds.  Neurological:     Mental Status: She is alert and oriented to person, place, and time.  Psychiatric:        Behavior: Behavior normal.        Thought Content: Thought content normal.        Judgment: Judgment normal.     Ortho Exam Right knee exam shows no joint effusion.  Good range of motion without pain. Specialty Comments:  No specialty comments available.  Imaging: No results found.   PMFS History: Patient Active Problem List   Diagnosis Date Noted  . 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 PCP NOTES>>>>>>>>>>>>>>>>>> 11/21/2016  . Annual physical exam >>>>>>>>>>>>>. 11/19/2016  . Malignant neoplasm of overlapping sites of right breast in female, estrogen receptor positive ([place]) 11/15/2016  . Osteopenia 07/16/2015  . Other malaise and fatigue 05/24/2014  . Hypersomnolence 05/24/2014  . Chest pain, unspecified 05/24/2014  . Increased endometrial stripe thickness 07/18/2013  . Hyperlipidemia   .  Family history of colon cancer 05/23/2012   Past Medical History:  Diagnosis Date  . Breast cancer ([place])   . Cancer [facility]) december 2012   right breast  . History of breast cancer 07/2011   right  . History of chemotherapy 11/2011  . Hyperlipidemia    no current med.  . Personal history of chemotherapy    2012  . Personal history of radiation therapy    2012  . SVD (spontaneous vaginal delivery)    x 3    Family History  Problem Relation Age of Onset  . Colon cancer Mother        dx postpartum  . Congestive Heart Failure Mother   . Alzheimer's disease Mother   . Diabetes Father   . Breast cancer Maternal Aunt    . Breast cancer Cousin 3  . Diabetes Paternal Grandmother   . Breast cancer Maternal Aunt   . AAA (abdominal aortic aneurysm) Neg Hx   . CAD Neg Hx     Past Surgical History:  Procedure Laterality Date  . BREAST LUMPECTOMY Right 08/2011  . CARPAL TUNNEL RELEASE Left 06/19/2013   Procedure: CARPAL TUNNEL RELEASE;  Surgeon: Cammie Sickle., MD;  Location: [place];  Service: Orthopedics;  Laterality: Left;  . CESAREAN SECTION     x 1  . COLON SURGERY     Hx polyps  . LAPAROSCOPIC SALPINGO OOPHERECTOMY Bilateral 04/13/2016   Procedure: LAPAROSCOPIC SALPINGO OOPHORECTOMY;  Surgeon: Terrance Mass, MD;  Location: [facility];  Service: Gynecology;  Laterality: Bilateral;  . PORT-A-CATH REMOVAL  06/2012  . PORTACATH PLACEMENT  2013  . SENTINEL LYMPH NODE BIOPSY Right 08/2011  . WISDOM TOOTH EXTRACTION     Social History   Occupational History  . Occupation: retired from the [employer]  . Smoking status: Former Smoker    Years: 3.00    Types: Cigarettes    Quit date: 09/13/2001    Years since quitting: 19.4  . Smokeless tobacco: Never Used  . Tobacco comment: social-light smoker  Vaping Use  . Vaping Use: Never used  Substance and Sexual Activity  . Alcohol use: No  . Drug use: No  . Sexual activity: Not Currently    Partners: Male    Birth control/protection: Post-menopausal, None    Comment: 1st intercourse- 16, partners- 29, married- 10 yrs

## 2021-02-17 ENCOUNTER — Encounter: Payer: Self-pay | Admitting: Internal Medicine

## 2021-02-17 MED ORDER — ROSUVASTATIN CALCIUM 5 MG PO TABS: 5.0000 mg | ORAL_TABLET | Freq: Every day | ORAL | 1 refills | Status: DC

## 2021-02-25 DIAGNOSIS — N3941 Urge incontinence: Secondary | ICD-10-CM | POA: Diagnosis not present

## 2021-02-25 DIAGNOSIS — N390 Urinary tract infection, site not specified: Secondary | ICD-10-CM | POA: Diagnosis not present

## 2021-02-25 DIAGNOSIS — B951 Streptococcus, group B, as the cause of diseases classified elsewhere: Secondary | ICD-10-CM | POA: Diagnosis not present

## 2021-02-25 DIAGNOSIS — R351 Nocturia: Secondary | ICD-10-CM | POA: Diagnosis not present

## 2021-02-25 DIAGNOSIS — R350 Frequency of micturition: Secondary | ICD-10-CM | POA: Diagnosis not present

## 2021-03-19 ENCOUNTER — Encounter: Payer: Self-pay | Admitting: Internal Medicine

## 2021-03-23 ENCOUNTER — Encounter: Payer: Self-pay | Admitting: Internal Medicine

## 2021-03-23 ENCOUNTER — Ambulatory Visit: Payer: Federal, State, Local not specified - PPO | Admitting: Internal Medicine

## 2021-03-23 ENCOUNTER — Other Ambulatory Visit: Payer: Self-pay

## 2021-03-23 VITALS — BP 120/76 | HR 92 | Temp 98.2°F | Resp 16 | Ht <= 58 in | Wt 177.0 lb

## 2021-03-23 DIAGNOSIS — R079 Chest pain, unspecified: Secondary | ICD-10-CM

## 2021-03-23 NOTE — Progress Notes (Signed)
Subjective:    Patient ID: Nichole [name], female    DOB: [birth year], 70 y.o.   MRN: [id number]  DOS:  03/23/2021 Type of visit - description: Acute  Several months history of chest discomfort. Is described as "gas" feeling, pressure at the center of the chest, on and off, lately has been more frequent, it lasts few seconds, sometimes happens 2 to 3 times in a row. Typically at rest, sometimes at night.  No associated shortness of breath, nausea, diaphoresis. Denies any cough No GERD symptoms or dysphagia Not taking any OTCs such as decongestants or weight loss medications.  Review of Systems See above   Past Medical History:  Diagnosis Date   Breast cancer ([place])    Cancer [facility]) december 2012   right breast   History of breast cancer 07/2011   right   History of chemotherapy 11/2011   Hyperlipidemia    no current med.   Personal history of chemotherapy    2012   Personal history of radiation therapy    2012   SVD (spontaneous vaginal delivery)    x 3    Past Surgical History:  Procedure Laterality Date   BREAST LUMPECTOMY Right 08/2011   CARPAL TUNNEL RELEASE Left 06/19/2013   Procedure: CARPAL TUNNEL RELEASE;  Surgeon: Cammie Sickle., MD;  Location: [facility];  Service: Orthopedics;  Laterality: Left;   CESAREAN SECTION     x 1   COLON SURGERY     Hx polyps   LAPAROSCOPIC SALPINGO OOPHERECTOMY Bilateral 04/13/2016   Procedure: LAPAROSCOPIC SALPINGO OOPHORECTOMY;  Surgeon: Terrance Mass, MD;  Location: [facility];  Service: Gynecology;  Laterality: Bilateral;   PORT-A-CATH REMOVAL  06/2012   PORTACATH PLACEMENT  2013   SENTINEL LYMPH NODE BIOPSY Right 08/2011   WISDOM TOOTH EXTRACTION      Allergies as of 03/23/2021       Reactions   Penicillins Rash   Has patient had a PCN reaction causing immediate rash, facial/tongue/throat swelling, SOB or lightheadedness with hypotension: No Has patient had a PCN reaction causing severe rash involving  mucus membranes or skin necrosis: No Has patient had a PCN reaction that required hospitalization No Has patient had a PCN reaction occurring within the last 10 years: Yes If all of the above answers are "NO", then may proceed with Cephalosporin use.   Poractant Alfa Rash   Pork-derived Products Rash        Medication List        Accurate as of March 23, 2021 11:59 PM. If you have any questions, ask your nurse or doctor.          cholecalciferol 25 MCG (1000 UNIT) tablet Commonly known as: VITAMIN D3 Take 1,000 Units by mouth daily.   rosuvastatin 5 MG tablet Commonly known as: CRESTOR Take 1 tablet (5 mg total) by mouth daily.   valACYclovir 1000 MG tablet Commonly known as: Valtrex For each episode, take 2 tablets twice a day for 1 day           Objective:   Physical Exam BP 120/76 (BP Location: Left Arm, Patient Position: Sitting, Cuff Size: Normal)   Pulse 92   Temp 98.2 F (36.8 C) (Oral)   Resp 16   Ht 4\' 9"  (1.448 m)   Wt 177 lb (80.3 kg)   SpO2 98%   BMI 38.30 kg/m  General:   Well developed, NAD, BMI noted. HEENT:  Normocephalic . Face symmetric, atraumatic  Lungs:  CTA B Normal respiratory effort, no intercostal retractions, no accessory muscle use. Heart: RRR,  no murmur.  Lower extremities: no pretibial edema bilaterally  Skin: Not pale. Not jaundice Neurologic:  alert & oriented X3.  Speech normal, gait appropriate for age and unassisted Psych--  Cognition and judgment appear intact.  Cooperative with normal attention span and concentration.  Behavior appropriate. No anxious or depressed appearing.      Assessment     Assessment  (New patient 11-2016) Hyperlipidemia Breast cancer R, 2012, lumpectomy, chemotherapy, s/p Port-A-Cath; d/c anastrazole 2018; d/c from oncology, f/u w/ PCP, will need yearly MMG and clinical breast exam Lipoma, left abdomen. Reactive airway disease Herpes labialis- valtrex prn  PLAN  Chest pain: As  described above, she had similar symptoms few months ago. She is s/p six cycles of carboplatin/ docetaxel/ trastuzumab completed June 2012 for breast cancer, follow-up  echo: essentially wnl Etiology atypical for CAD, EKG today: NSR, no major difference compared to previous ekg, no acute changes. Plan:  Echo, refer to cards to see what would be the best next step, observation?  Further eval? If sxs severe to let me know    This visit occurred during the SARS-CoV-2 public health emergency.  Safety protocols were in place, including screening questions prior to the visit, additional usage of staff PPE, and extensive cleaning of exam room while observing appropriate contact time as indicated for disinfecting solutions.

## 2021-03-24 NOTE — Assessment & Plan Note (Signed)
Chest pain: As described above, she had similar symptoms few months ago. She is s/p six cycles of carboplatin/ docetaxel/ trastuzumab completed June 2012 for breast cancer, follow-up  echo: essentially wnl Etiology atypical for CAD, EKG today: NSR, no major difference compared to previous ekg, no acute changes. Plan:  Echo, refer to cards to see what would be the best next step, observation?  Further eval? If sxs severe to let me know

## 2021-04-08 DIAGNOSIS — N390 Urinary tract infection, site not specified: Secondary | ICD-10-CM | POA: Diagnosis not present

## 2021-04-08 DIAGNOSIS — N3941 Urge incontinence: Secondary | ICD-10-CM | POA: Diagnosis not present

## 2021-04-08 DIAGNOSIS — B951 Streptococcus, group B, as the cause of diseases classified elsewhere: Secondary | ICD-10-CM | POA: Diagnosis not present

## 2021-04-08 DIAGNOSIS — R351 Nocturia: Secondary | ICD-10-CM | POA: Diagnosis not present

## 2021-04-10 ENCOUNTER — Ambulatory Visit (HOSPITAL_COMMUNITY): Payer: Federal, State, Local not specified - PPO | Attending: Cardiovascular Disease

## 2021-04-10 ENCOUNTER — Other Ambulatory Visit: Payer: Self-pay

## 2021-04-10 DIAGNOSIS — R079 Chest pain, unspecified: Secondary | ICD-10-CM | POA: Diagnosis not present

## 2021-04-10 LAB — ECHOCARDIOGRAM COMPLETE
Area-P 1/2: 3.65 cm2
S' Lateral: 2.8 cm

## 2021-04-10 MED ORDER — PERFLUTREN LIPID MICROSPHERE
1.0000 mL | INTRAVENOUS | Status: AC | PRN
  Administered 2021-04-10: 2 mL via INTRAVENOUS

## 2021-04-21 ENCOUNTER — Other Ambulatory Visit: Payer: Self-pay

## 2021-04-21 DIAGNOSIS — Z9221 Personal history of antineoplastic chemotherapy: Secondary | ICD-10-CM | POA: Insufficient documentation

## 2021-04-21 DIAGNOSIS — Z923 Personal history of irradiation: Secondary | ICD-10-CM | POA: Insufficient documentation

## 2021-04-22 ENCOUNTER — Ambulatory Visit: Payer: Federal, State, Local not specified - PPO | Admitting: Cardiology

## 2021-04-22 ENCOUNTER — Encounter: Payer: Self-pay | Admitting: Cardiology

## 2021-04-22 ENCOUNTER — Telehealth: Payer: Self-pay

## 2021-04-22 VITALS — BP 108/68 | HR 94 | Ht <= 58 in | Wt 175.0 lb

## 2021-04-22 DIAGNOSIS — R0789 Other chest pain: Secondary | ICD-10-CM | POA: Diagnosis not present

## 2021-04-22 DIAGNOSIS — R079 Chest pain, unspecified: Secondary | ICD-10-CM | POA: Diagnosis not present

## 2021-04-22 DIAGNOSIS — Z170 Estrogen receptor positive status [ER+]: Secondary | ICD-10-CM

## 2021-04-22 DIAGNOSIS — E782 Mixed hyperlipidemia: Secondary | ICD-10-CM

## 2021-04-22 DIAGNOSIS — C50811 Malignant neoplasm of overlapping sites of right female breast: Secondary | ICD-10-CM | POA: Diagnosis not present

## 2021-04-22 MED ORDER — METOPROLOL TARTRATE 50 MG PO TABS: ORAL_TABLET | ORAL | 0 refills | Status: DC

## 2021-04-22 NOTE — Progress Notes (Signed)
Cardiology Consultation:    Date:  04/22/2021   ID:  JAZZELL FARD, DOB [birth year], MRN [id number]  PCP:  Colon Branch, MD  Cardiologist:  Jenne Campus, MD   Referring MD: Colon Branch, MD   Chief Complaint  Patient presents with   sharp chest pain    Ongoing since 07/2020, Comes and goes    Results    Echo    History of Present Illness:    Nichole [name] is a 70 y.o. female who is being seen today for the evaluation of atypical chest pain at the request of Colon Branch, MD. she is a sweet lady originally from [place].  She was referred to [place] because of episode of chest pain.  Apparently she does have history of breast cancer that was treated with chemotherapy as well as radiation therapy.  Therefore last few months she experiencing sharp pinpoint pain located in the left side of her chest usually happening at rest lasting only for a few seconds.  There is no shortness of breath no sweating associated with this sensation.  She does does not have this sensation with exercise.  She seems to be a lot quite energetic and she have no difficulty walking or climbing stairs she gets short of breath with that.  Interestingly she is going to trip to [place] and she is planning to visit point within the next few weeks. She does not smoke anymore. She does not family history of premature coronary artery disease She does have dyslipidemia and she is not on rosuvastatin  Past Medical History:  Diagnosis Date   Breast cancer ([place])    Cancer [facility]) december 2012   right breast   History of breast cancer 07/2011   right   History of chemotherapy 11/2011   Hyperlipidemia    no current med.   Personal history of chemotherapy    2012   Personal history of radiation therapy    2012   SVD (spontaneous vaginal delivery)    x 3    Past Surgical History:  Procedure Laterality Date   BREAST LUMPECTOMY Right 08/2011   CARPAL TUNNEL RELEASE Left 06/19/2013   Procedure: CARPAL TUNNEL RELEASE;   Surgeon: Cammie Sickle., MD;  Location: [facility];  Service: Orthopedics;  Laterality: Left;   CESAREAN SECTION     x 1   COLON SURGERY     Hx polyps   LAPAROSCOPIC SALPINGO OOPHERECTOMY Bilateral 04/13/2016   Procedure: LAPAROSCOPIC SALPINGO OOPHORECTOMY;  Surgeon: Terrance Mass, MD;  Location: [facility];  Service: Gynecology;  Laterality: Bilateral;   PORT-A-CATH REMOVAL  06/2012   PORTACATH PLACEMENT  2013   SENTINEL LYMPH NODE BIOPSY Right 08/2011   WISDOM TOOTH EXTRACTION      Current Medications: Current Meds  Medication Sig   Ascorbic Acid (VITAMIN C PO) Take 1 tablet by mouth daily. Unknown strength   BIOTIN PO Take 1 tablet by mouth daily. Unknown strenght   cholecalciferol (VITAMIN D3) 25 MCG (1000 UT) tablet Take 1,000 Units by mouth daily.   rosuvastatin (CRESTOR) 5 MG tablet Take 1 tablet (5 mg total) by mouth daily.   zinc gluconate 50 MG tablet Take 50 mg by mouth daily.     Allergies:   Penicillins, Poractant alfa, and Pork-derived products   Social History   Socioeconomic History   Marital status: Married    Spouse name: Not on file   Number of children: 4   Years  of education: Not on file   Highest education level: Not on file  Occupational History   Occupation: retired from the [employer]   Smoking status: Former    Years: 3.00    Types: Cigarettes    Quit date: 09/13/2001    Years since quitting: 19.6   Smokeless tobacco: Never   Tobacco comments:    social-light smoker  Vaping Use   Vaping Use: Never used  Substance and Sexual Activity   Alcohol use: No   Drug use: No   Sexual activity: Not Currently    Partners: Male    Birth control/protection: Post-menopausal, None    Comment: 1st intercourse- 16, partners- 36, married- 15 yrs   Other Topics Concern   Not on file  Social History Narrative   Original from [place]   Lives w/ husband, travels frequently   Social Determinants of Health   Financial Resource  Strain: Not on file  Food Insecurity: Not on file  Transportation Needs: Not on file  Physical Activity: Not on file  Stress: Not on file  Social Connections: Not on file     Family History: The patient's family history includes Alzheimer's disease in her mother; Breast cancer in her maternal aunt and maternal aunt; Breast cancer (age of onset: 13) in her cousin; Colon cancer in her mother; Congestive Heart Failure in her mother; Diabetes in her father and paternal grandmother. There is no history of AAA (abdominal aortic aneurysm) or CAD. ROS:   Please see the history of present illness.    All 14 point review of systems negative except as described per history of present illness.  EKGs/Labs/Other Studies Reviewed:    The following studies were reviewed today: Echocardiogram reviewed which showed preserved left ventricle ejection fraction, she does have sclerotic aortic valve without significant stenosis  EKG:  EKG is  ordered today.  The ekg ordered today demonstrates normal sinus rhythm normal P interval criteria for LVH, nonspecific ST segment changes.  Recent Labs: 07/21/2020: ALT 16; BUN 12; Creatinine, Ser 0.65; Hemoglobin 14.3; Platelets 217.0; Potassium 4.1; Sodium 142  Recent Lipid Panel    Component Value Date/Time   CHOL 173 07/21/2020 1055   TRIG 131.0 07/21/2020 1055   HDL 54.20 07/21/2020 1055   CHOLHDL 3 07/21/2020 1055   VLDL 26.2 07/21/2020 1055   LDLCALC 92 07/21/2020 1055   LDLDIRECT 197.0 05/24/2014 1650    Physical Exam:    VS:  BP 108/68 (BP Location: Left Arm, Patient Position: Sitting)   Pulse 94   Ht '4\' 10"'$  (1.473 m)   Wt 175 lb (79.4 kg)   SpO2 94%   BMI 36.58 kg/m     Wt Readings from Last 3 Encounters:  04/22/21 175 lb (79.4 kg)  03/23/21 177 lb (80.3 kg)  02/11/21 175 lb (79.4 kg)     GEN:  Well nourished, well developed in no acute distress HEENT: Normal NECK: No JVD; No carotid bruits LYMPHATICS: No lymphadenopathy CARDIAC: RRR, no  murmurs, no rubs, no gallops RESPIRATORY:  Clear to auscultation without rales, wheezing or rhonchi  ABDOMEN: Soft, non-tender, non-distended MUSCULOSKELETAL:  No edema; No deformity  SKIN: Warm and dry NEUROLOGIC:  Alert and oriented x 3 PSYCHIATRIC:  Normal affect   ASSESSMENT:    1. Atypical chest pain   2. Mixed hyperlipidemia   3. Malignant neoplasm of overlapping sites of right breast in female, estrogen receptor positive ([place])    PLAN:    In order of  problems listed above:  Atypical chest pain not related to exercise lasting for only for split-second.  She does have however multiple risk factors for coronary artery disease.  I am also worried about postradiation syndrome.  I will ask her to have coronary CT angio to rule out significant coronary artery disease.  I will not alter any of her medication right now overall after her low level of suspicion for significant coronary artery disease. Mixed dyslipidemia she is taking Crestor I did review her K PN from last year which show LDL of 92 HDL of 54.  We may required intensification of his therapy.  The decision will be made based on results of coronary CT angio. History of malignant breast cancer.  Noted.   Medication Adjustments/Labs and Tests Ordered: Current medicines are reviewed at length with the patient today.  Concerns regarding medicines are outlined above.  No orders of the defined types were placed in this encounter.  No orders of the defined types were placed in this encounter.   Signed, Park Liter, MD, [facility]. 04/22/2021 11:01 AM    [facility]

## 2021-04-22 NOTE — Telephone Encounter (Signed)
Patient made aware of why Lopressor was sent and instructions, she verbalized understanding

## 2021-04-22 NOTE — Patient Instructions (Addendum)
Medication Instructions:  Your physician recommends that you continue on your current medications as directed. Please refer to the Current Medication list given to you today.  *If you need a refill on your cardiac medications before your next appointment, please call your pharmacy*   Lab Work: Your physician recommends that you return for lab work in:  3-7 days before CT scan:  BMET If you have labs (blood work) drawn today and your tests are completely normal, you will receive your results only by: [place] (if you have MyChart) OR A paper copy in the mail If you have any lab test that is abnormal or we need to change your treatment, we will call you to review the results.   Testing/Procedures:   Your cardiac CT will be scheduled at one of the below locations:   [facility] [address] [place], [place] [address] [phone]   If scheduled at [facility], please arrive at the [facility] main entrance (entrance A) of [facility] 30 minutes prior to test start time. Proceed to the [facility] Radiology Department (first floor) to check-in and test prep.   Please follow these instructions carefully (unless otherwise directed):    On the Night Before the Test: Be sure to Drink plenty of water. Do not consume any caffeinated/decaffeinated beverages or chocolate 12 hours prior to your test. Do not take any antihistamines 12 hours prior to your test.   On the Day of the Test: Drink plenty of water until 1 hour prior to the test. Do not eat any food 4 hours prior to the test. You may take your regular medications prior to the test.  Take metoprolol (Lopressor) two hours prior to test. FEMALES- please wear underwire-free bra if available, avoid dresses & tight clothing      After the Test: Drink plenty of water. After receiving IV contrast, you may experience a mild flushed feeling. This is normal. On occasion, you may experience a  mild rash up to 24 hours after the test. This is not dangerous. If this occurs, you can take Benadryl 25 mg and increase your fluid intake. If you experience trouble breathing, this can be serious. If it is severe call 911 IMMEDIATELY. If it is mild, please call our office. If you take any of these medications: Glipizide/Metformin, Avandament, Glucavance, please do not take 48 hours after completing test unless otherwise instructed.  Please allow 2-4 weeks for scheduling of routine cardiac CTs. Some insurance companies require a pre-authorization which may delay scheduling of this test.   For non-scheduling related questions, please contact the cardiac imaging nurse navigator should you have any questions/concerns: Marchia Bond, Cardiac Imaging Nurse Navigator Gordy Clement, Cardiac Imaging Nurse Navigator [facility] Direct Office Dial: [phone]   For scheduling needs, including cancellations and rescheduling, please call [place], [phone].    Follow-Up: At [facility], you and your health needs are our priority.  As part of our continuing mission to provide you with exceptional heart care, we have created designated Provider Care Teams.  These Care Teams include your primary Cardiologist (physician) and Advanced Practice Providers (APPs -  Physician Assistants and Nurse Practitioners) who all work together to provide you with the care you need, when you need it.  We recommend signing up for the patient portal called "MyChart".  Sign up information is provided on this After Visit Summary.  MyChart is used to connect with patients for Virtual Visits (Telemedicine).  Patients are able to view lab/test results, encounter notes, upcoming appointments, etc.  Non-urgent messages can be sent to your provider as well.   To learn more about what you can do with MyChart, go to [email or web address].    Your next appointment:   3 month(s)  The format for your  next appointment:   In Person  Provider:   Jenne Campus, MD   Other Instructions

## 2021-04-29 DIAGNOSIS — R0789 Other chest pain: Secondary | ICD-10-CM | POA: Diagnosis not present

## 2021-04-30 ENCOUNTER — Encounter: Payer: Self-pay | Admitting: Nurse Practitioner

## 2021-04-30 ENCOUNTER — Ambulatory Visit: Payer: Federal, State, Local not specified - PPO | Admitting: Nurse Practitioner

## 2021-04-30 ENCOUNTER — Other Ambulatory Visit: Payer: Self-pay

## 2021-04-30 VITALS — BP 126/76

## 2021-04-30 DIAGNOSIS — N952 Postmenopausal atrophic vaginitis: Secondary | ICD-10-CM | POA: Diagnosis not present

## 2021-04-30 DIAGNOSIS — L292 Pruritus vulvae: Secondary | ICD-10-CM | POA: Diagnosis not present

## 2021-04-30 DIAGNOSIS — N898 Other specified noninflammatory disorders of vagina: Secondary | ICD-10-CM | POA: Diagnosis not present

## 2021-04-30 LAB — BASIC METABOLIC PANEL
BUN/Creatinine Ratio: 16 (ref 12–28)
BUN: 10 mg/dL (ref 8–27)
CO2: 25 mmol/L (ref 20–29)
Calcium: 9.4 mg/dL (ref 8.7–10.3)
Chloride: 105 mmol/L (ref 96–106)
Creatinine, Ser: 0.63 mg/dL (ref 0.57–1.00)
Glucose: 117 mg/dL — ABNORMAL HIGH (ref 65–99)
Potassium: 4.1 mmol/L (ref 3.5–5.2)
Sodium: 143 mmol/L (ref 134–144)
eGFR: 95 mL/min/{1.73_m2} (ref >59)

## 2021-04-30 LAB — WET PREP FOR TRICH, YEAST, CLUE

## 2021-04-30 NOTE — Progress Notes (Signed)
   Acute Office Visit  Subjective:    Patient ID: Nichole [name], female    DOB: [birth year], 70 y.o.   MRN: [id number]   HPI 70 y.o. presents today for vaginal itching and odor that started one week ago. The itching is intermittent, external and worse at night. Denies discharge. She is unsure if the odor is vaginal or groin as she does wear tight clothing daily.    Review of Systems  Constitutional: Negative.   Genitourinary:  Negative for vaginal discharge.       Vaginal itching and odor      Objective:    Physical Exam Constitutional:      Appearance: Normal appearance.  Genitourinary:    Vagina: Normal. No vaginal discharge or erythema.     Comments: Mild redness, atrophic changes   BP 126/76  Wt Readings from Last 3 Encounters:  04/22/21 175 lb (79.4 kg)  03/23/21 177 lb (80.3 kg)  02/11/21 175 lb (79.4 kg)   Wet prep negative     Assessment & Plan:   Problem List Items Addressed This Visit   None Visit Diagnoses     Atrophic vaginitis    -  Primary   Vulvar itching       Relevant Orders   WET PREP FOR [place], YEAST, CLUE      Plan: Symptoms likely from atrophic vaginitis. Reassurance provided that no infection is present. Discussed management options for symptoms. History of ER + breast cancer so we will avoid any estrogen creams. Recommend coconut oil applied externally twice daily. Odor likely from sweating/moisture from tight clothing. If symptoms worsen or do not improve she will return to office. She is agreeable to plan.      Tamela Gammon DNP, 11:03 AM 04/30/2021

## 2021-05-04 ENCOUNTER — Telehealth (HOSPITAL_COMMUNITY): Payer: Self-pay | Admitting: *Deleted

## 2021-05-04 ENCOUNTER — Telehealth: Payer: Self-pay | Admitting: Cardiology

## 2021-05-04 NOTE — Telephone Encounter (Signed)
Patient returning call for lab results. 

## 2021-05-04 NOTE — Telephone Encounter (Signed)
Reaching out to patient to offer assistance regarding upcoming cardiac imaging study; pt verbalizes understanding of appt date/time, parking situation and where to check in, pre-test NPO status and medications ordered, and verified current allergies; name and call back number provided for further questions should they arise  Nichole Clement RN Navigator Cardiac Imaging [name] [phone] office [phone] cell  Patient to take '50mg'$  metoprolol tartrate 2 hours prior to cardiac CT scan.

## 2021-05-05 ENCOUNTER — Other Ambulatory Visit: Payer: Self-pay

## 2021-05-05 ENCOUNTER — Ambulatory Visit (HOSPITAL_COMMUNITY)
Admission: RE | Admit: 2021-05-05 | Discharge: 2021-05-05 | Disposition: A | Discharge status: Home / Self Care | Payer: Federal, State, Local not specified - PPO | Source: Ambulatory Visit | Attending: Cardiology | Admitting: Cardiology

## 2021-05-05 DIAGNOSIS — R079 Chest pain, unspecified: Secondary | ICD-10-CM

## 2021-05-05 MED ORDER — IOHEXOL 350 MG/ML SOLN
100.0000 mL | Freq: Once | INTRAVENOUS | Status: AC | PRN
  Administered 2021-05-05: 95 mL via INTRAVENOUS

## 2021-05-05 MED ORDER — NITROGLYCERIN 0.4 MG SL SUBL
0.8000 mg | SUBLINGUAL_TABLET | Freq: Once | SUBLINGUAL | Status: AC
  Administered 2021-05-05: 0.8 mg via SUBLINGUAL

## 2021-05-05 MED ORDER — NITROGLYCERIN 0.4 MG SL SUBL
SUBLINGUAL_TABLET | SUBLINGUAL | Status: AC
  Filled 2021-05-05: qty 2

## 2021-05-06 ENCOUNTER — Encounter: Payer: Self-pay | Admitting: Internal Medicine

## 2021-05-06 MED ORDER — ROSUVASTATIN CALCIUM 5 MG PO TABS: 5.0000 mg | ORAL_TABLET | Freq: Every day | ORAL | 1 refills | Status: DC

## 2021-05-08 NOTE — Telephone Encounter (Signed)
Patient returning call for lab results. 

## 2021-05-11 MED ORDER — ROSUVASTATIN CALCIUM 5 MG PO TABS: 5.0000 mg | ORAL_TABLET | Freq: Every day | ORAL | 1 refills | Status: DC

## 2021-07-10 ENCOUNTER — Encounter: Payer: Self-pay | Admitting: Internal Medicine

## 2021-07-10 ENCOUNTER — Ambulatory Visit (INDEPENDENT_AMBULATORY_CARE_PROVIDER_SITE_OTHER): Payer: Federal, State, Local not specified - PPO | Admitting: Internal Medicine

## 2021-07-10 ENCOUNTER — Other Ambulatory Visit: Payer: Self-pay

## 2021-07-10 VITALS — BP 126/78 | HR 92 | Temp 98.3°F | Resp 16 | Ht <= 58 in | Wt 173.4 lb

## 2021-07-10 DIAGNOSIS — Z0001 Encounter for general adult medical examination with abnormal findings: Secondary | ICD-10-CM

## 2021-07-10 DIAGNOSIS — R739 Hyperglycemia, unspecified: Secondary | ICD-10-CM

## 2021-07-10 DIAGNOSIS — M7711 Lateral epicondylitis, right elbow: Secondary | ICD-10-CM

## 2021-07-10 DIAGNOSIS — Z0000 Encounter for general adult medical examination without abnormal findings: Secondary | ICD-10-CM | POA: Diagnosis not present

## 2021-07-10 DIAGNOSIS — R918 Other nonspecific abnormal finding of lung field: Secondary | ICD-10-CM | POA: Diagnosis not present

## 2021-07-10 DIAGNOSIS — E782 Mixed hyperlipidemia: Secondary | ICD-10-CM | POA: Diagnosis not present

## 2021-07-10 LAB — COMPREHENSIVE METABOLIC PANEL
ALT: 19 U/L (ref 0–35)
AST: 18 U/L (ref 0–37)
Albumin: 4.2 g/dL (ref 3.5–5.2)
Alkaline Phosphatase: 81 U/L (ref 39–117)
BUN: 13 mg/dL (ref 6–23)
CO2: 30 mEq/L (ref 19–32)
Calcium: 9.2 mg/dL (ref 8.4–10.5)
Chloride: 105 mEq/L (ref 96–112)
Creatinine, Ser: 0.67 mg/dL (ref 0.40–1.20)
GFR: 87.68 mL/min (ref >60.00)
Glucose, Bld: 95 mg/dL (ref 70–99)
Potassium: 4.1 mEq/L (ref 3.5–5.1)
Sodium: 144 mEq/L (ref 135–145)
Total Bilirubin: 0.8 mg/dL (ref 0.2–1.2)
Total Protein: 6.4 g/dL (ref 6.0–8.3)

## 2021-07-10 LAB — CBC WITH DIFFERENTIAL/PLATELET
Basophils Absolute: 0 10*3/uL (ref 0.0–0.1)
Basophils Relative: 0.6 % (ref 0.0–3.0)
Eosinophils Absolute: 0.2 10*3/uL (ref 0.0–0.7)
Eosinophils Relative: 3.2 % (ref 0.0–5.0)
HCT: 44.5 % (ref 36.0–46.0)
Hemoglobin: 14.4 g/dL (ref 12.0–15.0)
Lymphocytes Relative: 35.3 % (ref 12.0–46.0)
Lymphs Abs: 2.4 10*3/uL (ref 0.7–4.0)
MCHC: 32.4 g/dL (ref 30.0–36.0)
MCV: 89.1 fl (ref 78.0–100.0)
Monocytes Absolute: 0.4 10*3/uL (ref 0.1–1.0)
Monocytes Relative: 6 % (ref 3.0–12.0)
Neutro Abs: 3.7 10*3/uL (ref 1.4–7.7)
Neutrophils Relative %: 54.9 % (ref 43.0–77.0)
Platelets: 225 10*3/uL (ref 150.0–400.0)
RBC: 4.99 Mil/uL (ref 3.87–5.11)
RDW: 13.6 % (ref 11.5–15.5)
WBC: 6.8 10*3/uL (ref 4.0–10.5)

## 2021-07-10 LAB — HEMOGLOBIN A1C: Hgb A1c MFr Bld: 6 % (ref 4.6–6.5)

## 2021-07-10 LAB — LIPID PANEL
Cholesterol: 195 mg/dL (ref 0–200)
HDL: 53.6 mg/dL (ref >39.00)
NonHDL: 141.02
Total CHOL/HDL Ratio: 4
Triglycerides: 201 mg/dL — ABNORMAL HIGH (ref 0.0–149.0)
VLDL: 40.2 mg/dL — ABNORMAL HIGH (ref 0.0–40.0)

## 2021-07-10 LAB — LDL CHOLESTEROL, DIRECT: Direct LDL: 108 mg/dL

## 2021-07-10 LAB — TSH: TSH: 2.16 u[IU]/mL (ref 0.35–5.50)

## 2021-07-10 NOTE — Patient Instructions (Addendum)
Recommend to proceed with the following vaccines at your pharmacy:  Shingrix (shingles) New covid booster (bivalent) Flu shot     GO TO THE LAB : Get the blood work     [place], [place] back for a physical exam in 1 year    "Living will", "[place] of attorney": Advanced care planning  (If you already have a living will or healthcare power of attorney, please bring the copy to be scanned in your chart.)  Advance care planning is a process that supports adults in  understanding and sharing their preferences regarding future medical care.   The patient's preferences are recorded in documents called Advance Directives.    Advanced directives are completed (and can be modified at any time) while the patient is in full mental capacity.   The documentation should be available at all times to the patient, the family and the healthcare providers.  Bring in a copy to be scanned in your chart is an excellent idea and is recommended   This legal documents direct treatment decision making and/or appoint a surrogate to make the decision if the patient is not capable to do so.    Advance directives can be documented in many types of formats,  documents have names such as:  Lliving will  Durable power of attorney for healthcare (healthcare proxy or healthcare power of attorney)  Combined directives  Physician orders for life-sustaining treatment    More information at:  [email or web address]

## 2021-07-10 NOTE — Progress Notes (Signed)
Subjective:    Patient ID: Nichole [name], female    DOB: 1951/07/30, 70 y.o.   MRN: [id number]  DOS:  07/10/2021 Type of visit - description: CPX  Here for CPX. In addition, we discussed her recent coronary calcium score, chronic medical issues, recent hyperglycemia. Also several months history of pain at the R elbow, no injury, no swelling, no neck pain.  Pain is worse when she reaches out with the arm. Denies any cough, hemoptysis, weight loss.      Review of Systems  Other than above, a 14 point review of systems is negative     Past Medical History:  Diagnosis Date   Breast cancer ([place])    Cancer [facility]) december 2012   right breast   History of breast cancer 07/2011   right   History of chemotherapy 11/2011   Hyperlipidemia    no current med.   Personal history of chemotherapy    2012   Personal history of radiation therapy    2012   SVD (spontaneous vaginal delivery)    x 3    Past Surgical History:  Procedure Laterality Date   BREAST LUMPECTOMY Right 08/2011   CARPAL TUNNEL RELEASE Left 06/19/2013   Procedure: CARPAL TUNNEL RELEASE;  Surgeon: Cammie Sickle., MD;  Location: [facility];  Service: Orthopedics;  Laterality: Left;   CESAREAN SECTION     x 1   COLON SURGERY     Hx polyps   LAPAROSCOPIC SALPINGO OOPHERECTOMY Bilateral 04/13/2016   Procedure: LAPAROSCOPIC SALPINGO OOPHORECTOMY;  Surgeon: Terrance Mass, MD;  Location: [facility];  Service: Gynecology;  Laterality: Bilateral;   PORT-A-CATH REMOVAL  06/2012   PORTACATH PLACEMENT  2013   SENTINEL LYMPH NODE BIOPSY Right 08/2011   WISDOM TOOTH EXTRACTION     Social History   Socioeconomic History   Marital status: Married    Spouse name: Not on file   Number of children: 4   Years of education: Not on file   Highest education level: Not on file  Occupational History   Occupation: retired from the [employer]   Smoking status: Former    Years: 3.00    Types:  Cigarettes    Quit date: 09/13/2001    Years since quitting: 19.8   Smokeless tobacco: Never   Tobacco comments:    social-light smoker  Vaping Use   Vaping Use: Never used  Substance and Sexual Activity   Alcohol use: No   Drug use: No   Sexual activity: Not Currently    Partners: Male    Birth control/protection: Post-menopausal, None    Comment: 1st intercourse- 16, partners- 43, married- 29 yrs   Other Topics Concern   Not on file  Social History Narrative   Original from [place]   Lives w/ husband, travels frequently   Social Determinants of Health   Financial Resource Strain: Not on file  Food Insecurity: Not on file  Transportation Needs: Not on file  Physical Activity: Not on file  Stress: Not on file  Social Connections: Not on file  Intimate Partner Violence: Not on file    Allergies as of 07/10/2021       Reactions   Penicillins Rash   Has patient had a PCN reaction causing immediate rash, facial/tongue/throat swelling, SOB or lightheadedness with hypotension: No Has patient had a PCN reaction causing severe rash involving mucus membranes or skin necrosis: No Has patient had a PCN reaction  that required hospitalization No Has patient had a PCN reaction occurring within the last 10 years: Yes If all of the above answers are "NO", then may proceed with Cephalosporin use.   Poractant Alfa Rash   Pork-derived Products Rash        Medication List        Accurate as of July 10, 2021 11:59 PM. If you have any questions, ask your nurse or doctor.          STOP taking these medications    cephALEXin 500 MG capsule Commonly known as: KEFLEX Stopped by: Kathlene November, MD   metoprolol tartrate 50 MG tablet Commonly known as: LOPRESSOR Stopped by: Kathlene November, MD       TAKE these medications    BIOTIN PO Take 1 tablet by mouth daily. Unknown strenght   cholecalciferol 25 MCG (1000 UNIT) tablet Commonly known as: VITAMIN D3 Take 1,000 Units by mouth  daily.   rosuvastatin 5 MG tablet Commonly known as: CRESTOR Take 1 tablet (5 mg total) by mouth daily.   valACYclovir 1000 MG tablet Commonly known as: Valtrex For each episode, take 2 tablets twice a day for 1 day   VITAMIN C PO Take 1 tablet by mouth daily. Unknown strength   zinc gluconate 50 MG tablet Take 50 mg by mouth daily.           Objective:   Physical Exam BP 126/78 (BP Location: Left Arm, Patient Position: Sitting, Cuff Size: Small)   Pulse 92   Temp 98.3 F (36.8 C) (Oral)   Resp 16   Ht 4\' 10"  (1.473 m)   Wt 173 lb 6 oz (78.6 kg)   SpO2 97%   BMI 36.24 kg/m  General: Well developed, NAD, BMI noted Neck: No  thyromegaly  HEENT:  Normocephalic . Face symmetric, atraumatic Lungs:  CTA B Normal respiratory effort, no intercostal retractions, no accessory muscle use. Heart: RRR,  no murmur.  Abdomen:  Not distended, soft, non-tender. No rebound or rigidity.   Lower extremities: no pretibial edema bilaterally Upper extremities: Normal to inspection and palpation except for mildly TTP at the R lateral epicondyle Skin: Exposed areas without rash. Not pale. Not jaundice Neurologic:  alert & oriented X3.  Speech normal, gait appropriate for age and unassisted Strength symmetric and appropriate for age.  Psych: Cognition and judgment appear intact.  Cooperative with normal attention span and concentration.  Behavior appropriate. No anxious or depressed appearing.     Assessment      Assessment  (New patient 11-2016) Hyperlipidemia Breast cancer R, 2012, lumpectomy, chemotherapy, s/p Port-A-Cath; d/c anastrazole 2018; d/c from oncology, f/u w/ PCP, will need yearly MMG and clinical breast exam Lipoma, left abdomen. Reactive airway disease Herpes labialis- valtrex prn Coronary calcium score: 0 (04-2021)  PLAN  Here for CPX Hyperlipidemia: On Crestor, checking labs Coronary calcium score : zero. Pulmonary nodules: Incidental finding on CT.  The  patient was a light smoker, quit many years ago, no symptoms, no FH of lung cancer that she knows of. I believe she is low risk, she will decide if likes another CT next year.    CT report: Few densities in the periphery of the right middle lobe. 2 mm nodular density in the right middle lobe is probably an incidental finding but indeterminate. No follow-up needed if patient is low-risk. Non-contrast chest CT can be considered in 12 months if patient is high-risk.   Tennis elbow: Icing, rest the arm, if no  better call for any sports medicine referral. RTC 1 year      This visit occurred during the SARS-CoV-2 public health emergency.  Safety protocols were in place, including screening questions prior to the visit, additional usage of staff PPE, and extensive cleaning of exam room while observing appropriate contact time as indicated for disinfecting solutions.

## 2021-07-11 ENCOUNTER — Encounter: Payer: Self-pay | Admitting: Internal Medicine

## 2021-07-11 DIAGNOSIS — R918 Other nonspecific abnormal finding of lung field: Secondary | ICD-10-CM | POA: Insufficient documentation

## 2021-07-11 NOTE — Assessment & Plan Note (Signed)
--  Td 2014 - Prevnar 2018;  pnm 23: 2019 - zostavax 2015 - shingrix d/w pt  -COVID-vaccine booster recommended with the new bivalent shot - Flu shot: declined  - immunizations  benefits clearly d/w pt, verbalized understanding --Female care: s/p B oophorectomy, no hysterectomy, saw gyn 04-2021 --H/o breast CA- last MMG 12-2020 ([facility]) --++FH personal history breast cancer, s/p genetics eval ~ 2012    --CCS:  Cscope  2010 in [place], had a flat plaque @ rectum.   cscope 06-2012, Dr Elbert Ewings, no polyps  cscope 08-07-2019 (per pt and [facility], no report copy). Pt told to RTC 5-7 years  - labs:   CMP, FLP, CBC, TSH, A1c (mild hyperglycemia noted on chart review) --Diet and exercise discussed. -- H-POA d/w pt

## 2021-07-11 NOTE — Assessment & Plan Note (Addendum)
Here for CPX Hyperlipidemia: On Crestor, checking labs Coronary calcium score : zero. Pulmonary nodules: Incidental finding on CT.  The patient was a light smoker, quit many years ago, no symptoms, no FH of lung cancer that she knows of. I believe she is low risk, she will decide if likes another CT next year.    CT report: Few densities in the periphery of the right middle lobe. 2 mm nodular density in the right middle lobe is probably an incidental finding but indeterminate. No follow-up needed if patient is low-risk. Non-contrast chest CT can be considered in 12 months if patient is high-risk.   Tennis elbow: Icing, rest the arm, if no better call for any sports medicine referral. RTC 1 year

## 2021-07-12 ENCOUNTER — Encounter: Payer: Self-pay | Admitting: Internal Medicine

## 2021-07-23 ENCOUNTER — Ambulatory Visit: Payer: Federal, State, Local not specified - PPO | Admitting: Cardiology

## 2021-12-15 ENCOUNTER — Ambulatory Visit: Payer: Federal, State, Local not specified - PPO | Admitting: Orthopaedic Surgery

## 2021-12-15 ENCOUNTER — Encounter: Payer: Self-pay | Admitting: Orthopaedic Surgery

## 2021-12-15 DIAGNOSIS — M1711 Unilateral primary osteoarthritis, right knee: Secondary | ICD-10-CM

## 2021-12-15 DIAGNOSIS — M79671 Pain in right foot: Secondary | ICD-10-CM | POA: Diagnosis not present

## 2021-12-15 MED ORDER — DICLOFENAC SODIUM 2 % EX SOLN: 2.0000 g | Freq: Two times a day (BID) | CUTANEOUS | 3 refills | Status: DC | PRN

## 2021-12-15 NOTE — Progress Notes (Signed)
? ?Office Visit Note ?  ?Patient: Nichole [name]           ?Date of Birth: 03/31/1951           ?MRN: [id number] ?Visit Date: 12/15/2021 ?             ?Requested by: Colon Branch, MD ?[address] ?[address] ?[place],  [place] [address] ?PCP: Colon Branch, MD ? ? ?Assessment & Plan: ?Visit Diagnoses:  ?1. Primary osteoarthritis of right knee   ?2. Pain in right foot   ? ? ?Plan: Based on the right knee MRI the findings are consistent with degenerative arthritis.  Her symptoms are mild and manageable.  I will send in prescription for Pennsaid ointment for knee arthritis.  For the foot she will use good supportive shoes and inserts and may also apply Pennsaid.  Follow-up as needed. ? ?Follow-Up Instructions: No follow-ups on file.  ? ?Orders:  ?No orders of the defined types were placed in this encounter. ? ?Meds ordered this encounter  ?Medications  ? Diclofenac Sodium (PENNSAID) 2 % SOLN  ?  Sig: Apply 2 g topically 2 (two) times daily as needed (to affected area).  ?  Dispense:  112 g  ?  Refill:  3  ? ? ? ? Procedures: ?No procedures performed ? ? ?Clinical Data: ?No additional findings. ? ? ?Subjective: ?Chief Complaint  ?Patient presents with  ? Right Foot - Pain  ? Right Knee - Pain  ? ? ?HPI ? ?Montgomery returns today to review right knee MRI and evaluation of new problem of right foot and heel pain for months.  Overall she has good days and bad days.  She has run out of Voltaren gel.  Currently not taking any pain medications. ? ?Review of Systems  ?Constitutional: Negative.   ?HENT: Negative.    ?Eyes: Negative.   ?Respiratory: Negative.    ?Cardiovascular: Negative.   ?Endocrine: Negative.   ?Musculoskeletal: Negative.   ?Neurological: Negative.   ?Hematological: Negative.   ?Psychiatric/Behavioral: Negative.    ?All other systems reviewed and are negative. ? ? ?Objective: ?Vital Signs: There were no vitals taken for this visit. ? ?Physical Exam ?Vitals and nursing note reviewed.  ?Constitutional:   ?    Appearance: She is well-developed.  ?Pulmonary:  ?   Effort: Pulmonary effort is normal.  ?Skin: ?   General: Skin is warm.  ?   Capillary Refill: Capillary refill takes less than 2 seconds.  ?Neurological:  ?   Mental Status: She is alert and oriented to person, place, and time.  ?Psychiatric:     ?   Behavior: Behavior normal.     ?   Thought Content: Thought content normal.     ?   Judgment: Judgment normal.  ? ? ?Ortho Exam ? ?Examination of the right knee is unchanged. ?Examination of right foot shows subjective pain across the midfoot and tenderness at the insertion of the Achilles.  Range of motion of the ankle and subtalar joints are normal.  Posterior tibial tendon is nontender. ? ?Specialty Comments:  ?No specialty comments available. ? ?Imaging: ?No results found. ? ? ?PMFS History: ?Patient Active Problem List  ? Diagnosis Date Noted  ? Pain in right foot 12/15/2021  ? Primary osteoarthritis of right knee 12/15/2021  ? Pulmonary nodules 07/11/2021  ? SVD (spontaneous vaginal delivery) 04/21/2021  ? Personal history of radiation therapy 04/21/2021  ? Personal history of chemotherapy 04/21/2021  ? Herpes  labialis 12/06/2017  ? PCP NOTES>>>>>>>>>>>>>>>>>> 11/21/2016  ? Annual physical exam >>>>>>>>>>>>>. 11/19/2016  ? Malignant neoplasm of overlapping sites of right breast in female, estrogen receptor positive ([place]) 11/15/2016  ? Osteopenia 07/16/2015  ? Other malaise and fatigue 05/24/2014  ? Hypersomnolence 05/24/2014  ? Atypical chest pain 05/24/2014  ? Increased endometrial stripe thickness 07/18/2013  ? Hyperlipidemia   ? Family history of colon cancer 05/23/2012  ? Cancer [facility]) 08/2011  ? History of breast cancer 07/2011  ? ?Past Medical History:  ?Diagnosis Date  ? Breast cancer ([place])   ? Cancer [facility]) december 2012  ? right breast  ? History of breast cancer 07/2011  ? right  ? History of chemotherapy 11/2011  ? Hyperlipidemia   ? no current med.  ? Personal history of chemotherapy   ? 2012  ?  Personal history of radiation therapy   ? 2012  ? SVD (spontaneous vaginal delivery)   ? x 3  ?  ?Family History  ?Problem Relation Age of Onset  ? Colon cancer Mother   ?     dx postpartum  ? Congestive Heart Failure Mother   ? Alzheimer's disease Mother   ? Diabetes Father   ? Diabetes Paternal Grandmother   ? Breast cancer Maternal Aunt   ? Breast cancer Maternal Aunt   ? Breast cancer Cousin 21  ? AAA (abdominal aortic aneurysm) Neg Hx   ? CAD Neg Hx   ? Lung cancer Neg Hx   ?  ?Past Surgical History:  ?Procedure Laterality Date  ? BREAST LUMPECTOMY Right 08/2011  ? CARPAL TUNNEL RELEASE Left 06/19/2013  ? Procedure: CARPAL TUNNEL RELEASE;  Surgeon: Cammie Sickle., MD;  Location: [place];  Service: Orthopedics;  Laterality: Left;  ? CESAREAN SECTION    ? x 1  ? COLON SURGERY    ? Hx polyps  ? LAPAROSCOPIC SALPINGO OOPHERECTOMY Bilateral 04/13/2016  ? Procedure: LAPAROSCOPIC SALPINGO OOPHORECTOMY;  Surgeon: Terrance Mass, MD;  Location: [facility];  Service: Gynecology;  Laterality: Bilateral;  ? PORT-A-CATH REMOVAL  06/2012  ? PORTACATH PLACEMENT  2013  ? SENTINEL LYMPH NODE BIOPSY Right 08/2011  ? WISDOM TOOTH EXTRACTION    ? ?Social History  ? ?Occupational History  ? Occupation: retired from the [employer]  ?Tobacco Use  ? Smoking status: Former  ?  Years: 3.00  ?  Types: Cigarettes  ?  Quit date: 09/13/2001  ?  Years since quitting: 20.2  ? Smokeless tobacco: Never  ? Tobacco comments:  ?  social-light smoker  ?Vaping Use  ? Vaping Use: Never used  ?Substance and Sexual Activity  ? Alcohol use: No  ? Drug use: No  ? Sexual activity: Not Currently  ?  Partners: Male  ?  Birth control/protection: Post-menopausal, None  ?  Comment: 1st intercourse- 16, partners- 67, married- 96 yrs   ? ? ? ? ? ? ?

## 2022-01-07 ENCOUNTER — Encounter: Payer: Self-pay | Admitting: Internal Medicine

## 2022-01-08 ENCOUNTER — Ambulatory Visit: Payer: Federal, State, Local not specified - PPO | Admitting: Obstetrics & Gynecology

## 2022-01-18 ENCOUNTER — Other Ambulatory Visit: Payer: Self-pay | Admitting: Internal Medicine

## 2022-01-18 DIAGNOSIS — Z1231 Encounter for screening mammogram for malignant neoplasm of breast: Secondary | ICD-10-CM

## 2022-01-27 ENCOUNTER — Ambulatory Visit
Admission: RE | Admit: 2022-01-27 | Discharge: 2022-01-27 | Disposition: A | Discharge status: Home / Self Care | Payer: Federal, State, Local not specified - PPO | Source: Ambulatory Visit | Attending: Internal Medicine | Admitting: Internal Medicine

## 2022-01-27 DIAGNOSIS — Z1231 Encounter for screening mammogram for malignant neoplasm of breast: Secondary | ICD-10-CM

## 2022-02-10 DIAGNOSIS — H2513 Age-related nuclear cataract, bilateral: Secondary | ICD-10-CM | POA: Diagnosis not present

## 2022-02-10 DIAGNOSIS — H52203 Unspecified astigmatism, bilateral: Secondary | ICD-10-CM | POA: Diagnosis not present

## 2022-02-10 DIAGNOSIS — H40023 Open angle with borderline findings, high risk, bilateral: Secondary | ICD-10-CM | POA: Diagnosis not present

## 2022-02-10 DIAGNOSIS — H02834 Dermatochalasis of left upper eyelid: Secondary | ICD-10-CM | POA: Diagnosis not present

## 2022-02-22 ENCOUNTER — Encounter: Payer: Self-pay | Admitting: Obstetrics & Gynecology

## 2022-02-22 ENCOUNTER — Other Ambulatory Visit (HOSPITAL_COMMUNITY)
Admission: RE | Admit: 2022-02-22 | Discharge: 2022-02-22 | Disposition: A | Discharge status: Home / Self Care | Payer: Federal, State, Local not specified - PPO | Source: Ambulatory Visit | Attending: Obstetrics & Gynecology | Admitting: Obstetrics & Gynecology

## 2022-02-22 ENCOUNTER — Ambulatory Visit (INDEPENDENT_AMBULATORY_CARE_PROVIDER_SITE_OTHER): Payer: Federal, State, Local not specified - PPO | Admitting: Obstetrics & Gynecology

## 2022-02-22 VITALS — BP 132/82 | HR 80 | Resp 12 | Ht <= 58 in | Wt 175.0 lb

## 2022-02-22 DIAGNOSIS — Z170 Estrogen receptor positive status [ER+]: Secondary | ICD-10-CM

## 2022-02-22 DIAGNOSIS — Z6837 Body mass index (BMI) 37.0-37.9, adult: Secondary | ICD-10-CM

## 2022-02-22 DIAGNOSIS — C50811 Malignant neoplasm of overlapping sites of right female breast: Secondary | ICD-10-CM | POA: Diagnosis not present

## 2022-02-22 DIAGNOSIS — R350 Frequency of micturition: Secondary | ICD-10-CM

## 2022-02-22 DIAGNOSIS — Z01419 Encounter for gynecological examination (general) (routine) without abnormal findings: Secondary | ICD-10-CM

## 2022-02-22 DIAGNOSIS — Z780 Asymptomatic menopausal state: Secondary | ICD-10-CM | POA: Diagnosis not present

## 2022-02-22 DIAGNOSIS — R8290 Unspecified abnormal findings in urine: Secondary | ICD-10-CM

## 2022-02-22 DIAGNOSIS — M8588 Other specified disorders of bone density and structure, other site: Secondary | ICD-10-CM

## 2022-02-22 NOTE — Progress Notes (Signed)
Nichole [name] 07-Apr-1951 [id number]   History:    71 y.o. G4P4L4 Married.  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  Status post right breast lumpectomy, radiation therapy, chemotherapy, tamoxifen, and Anastrozole which she finished October 2018. Had a laparoscopic bilateral salpingo-oophorectomy April 13, 2016.  Screening mammo Neg 01/2022.  Postmenopause, well on no hormone replacement therapy.  No postmenopausal bleeding.  No pelvic pain.  Abstinent, husband has ED.  Pap Neg 07/2017.  Pap reflex today.  C/O severe urinary incontinence even at rest without any urgency.  Body mass index 37.21.  Decrease portions and carbs.  Needs to walk more.  Bowel movements normal. Health labs with Dr. [name].  Colonoscopy 07/2019.  Bone Density Osteopenia -1.4 Lt distal Radius 11/2019.   Past medical history,surgical history, family history and social history were all reviewed and documented in the EPIC chart.  Gynecologic History No LMP recorded. Patient is postmenopausal.  Obstetric History OB History  Gravida Para Term Preterm AB Living  '4 4 4     4  '$ SAB IAB Ectopic Multiple Live Births          4    # Outcome Date GA Lbr Len/2nd Weight Sex Delivery Anes PTL Lv  4 Term     M CS-Unspec  N LIV  3 Term     M Vag-Spont  N LIV  2 Term     M Vag-Spont  N LIV  1 Term     M Vag-Spont  N LIV     ROS: A ROS was performed and pertinent positives and negatives are included in the history.  GENERAL: No fevers or chills. HEENT: No change in vision, no earache, sore throat or sinus congestion. NECK: No pain or stiffness. CARDIOVASCULAR: No chest pain or pressure. No palpitations. PULMONARY: No shortness of breath, cough or wheeze. GASTROINTESTINAL: No abdominal pain, nausea, vomiting or diarrhea, melena or bright red blood per rectum. GENITOURINARY: No urinary  frequency, urgency, hesitancy or dysuria. MUSCULOSKELETAL: No joint or muscle pain, no back pain, no recent trauma. DERMATOLOGIC: No rash, no itching, no lesions. ENDOCRINE: No polyuria, polydipsia, no heat or cold intolerance. No recent change in weight. HEMATOLOGICAL: No anemia or easy bruising or bleeding. NEUROLOGIC: No headache, seizures, numbness, tingling or weakness. PSYCHIATRIC: No depression, no loss of interest in normal activity or change in sleep pattern.     Exam:   BP 132/82 (BP Location: Left Arm, Patient Position: Sitting, Cuff Size: Normal)   Pulse 80   Resp 12   Ht 4' 9.5" (1.461 m)   Wt 175 lb (79.4 kg)   BMI 37.21 kg/m   Body mass index is 37.21 kg/m.  General appearance : Well developed well nourished female. No acute distress HEENT: Eyes: no retinal hemorrhage or exudates,  Neck supple, trachea midline, no carotid bruits, no thyroidmegaly Lungs: Clear to auscultation, no rhonchi or wheezes, or rib retractions  Heart: Regular rate and rhythm, no murmurs or gallops Breast:Examined in sitting and supine position were symmetrical in appearance, no palpable masses or tenderness,  no skin retraction, no nipple inversion, no nipple discharge, no skin discoloration, no axillary or supraclavicular lymphadenopathy Abdomen: no palpable masses or tenderness, no rebound or guarding Extremities: no edema or skin discoloration or tenderness  Pelvic: Vulva: Normal  Vagina: No gross lesions or discharge  Cervix: No gross lesions or discharge.  Pap reflex done.  Uterus  AV, normal size, shape and consistency, non-tender and mobile  Adnexa  Without masses or tenderness  Anus: Normal  U/A: Yellow clear, Pro Neg, Nit Neg, WBC 0-5, RBC 3-10, Bacteria Few.  Pending U. Culture.   Assessment/Plan:  71 y.o. female for annual exam   1. Encounter for routine gynecological examination with Papanicolaou smear of cervix History of right breast invasive ductal carcinoma  estrogen receptor positive diagnosed in 2012.  Status post right breast lumpectomy, radiation therapy, chemotherapy, tamoxifen, and Anastrozole which she finished October 2018. Had a laparoscopic bilateral salpingo-oophorectomy April 13, 2016.  Screening mammo Neg 01/2022.  Postmenopause, well on no hormone replacement therapy.  No postmenopausal bleeding.  No pelvic pain.  Abstinent, husband has ED.  Pap Neg 07/2017.  Pap reflex today.  C/O severe urinary incontinence even at rest without any urgency.  Body mass index 37.21.  Decrease portions and carbs.  Needs to walk more.  Bowel movements normal. Health labs with Dr. [name].  Colonoscopy 07/2019.  Bone Density Osteopenia -1.4 Lt distal Radius 11/2019. - Cytology - PAP( [place])  2. Postmenopause Had a laparoscopic bilateral salpingo-oophorectomy April 13, 2016.  Postmenopause, well on no hormone replacement therapy.  No postmenopausal bleeding.  No pelvic pain.  Abstinent, husband has ED.    3. Osteopenia of lumbar spine Bone Density Osteopenia -1.4 Lt distal Radius 11/2019.  Repeat at 3 yrs next year.  Vit D, Ca++ 1.5 g/d total, continue regular walking.  4. Urinary frequency Very mild U/A abnormalities, will wait on U. Culture to decide if ABTx is needed. - Urinalysis,Complete w/RFL Culture  5. Malignant neoplasm of overlapping sites of right breast in female, estrogen receptor positive ([place])  6. 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  Increase fitness activities.  Princess Bruins MD, 10:03 AM 02/22/2022

## 2022-02-23 LAB — CYTOLOGY - PAP: Diagnosis: NEGATIVE

## 2022-02-24 ENCOUNTER — Encounter: Payer: Self-pay | Admitting: Obstetrics & Gynecology

## 2022-02-24 LAB — URINALYSIS, COMPLETE W/RFL CULTURE
Bilirubin Urine: NEGATIVE
Glucose, UA: NEGATIVE
Hyaline Cast: NONE SEEN /LPF
Ketones, ur: NEGATIVE
Leukocyte Esterase: NEGATIVE
Nitrites, Initial: NEGATIVE
Protein, ur: NEGATIVE
Specific Gravity, Urine: 1.02 (ref 1.001–1.035)
pH: 5 (ref 5.0–8.0)

## 2022-02-24 LAB — URINE CULTURE
MICRO NUMBER:: 13512740
SPECIMEN QUALITY:: ADEQUATE

## 2022-02-24 LAB — CULTURE INDICATED

## 2022-05-21 ENCOUNTER — Encounter: Payer: Self-pay | Admitting: Internal Medicine

## 2022-05-26 ENCOUNTER — Ambulatory Visit: Payer: Federal, State, Local not specified - PPO | Admitting: Internal Medicine

## 2022-05-26 ENCOUNTER — Encounter: Payer: Self-pay | Admitting: Internal Medicine

## 2022-05-26 VITALS — BP 130/82 | HR 74 | Temp 98.4°F | Resp 16 | Ht <= 58 in | Wt 176.2 lb

## 2022-05-26 DIAGNOSIS — R202 Paresthesia of skin: Secondary | ICD-10-CM | POA: Diagnosis not present

## 2022-05-26 NOTE — Patient Instructions (Signed)
Observation Call if symptoms severe

## 2022-05-26 NOTE — Progress Notes (Signed)
Subjective:    Patient ID: Nichole [name], female    DOB: 20-Apr-1951, 71 y.o.   MRN: [id number]  DOS:  05/26/2022 Type of visit - description: acute  Symptoms a started approximately 3 weeks ago. Has a "strange feeling"  at the crown of the head. Described as "pins-and-needles or something crawling", also like something "gripping the area". Denies this being a headache. Symptoms are on and off, may last hours, worse at night.  Denies fever chills. Question of hair loss in the area but denies any rash. No nausea vomiting No visual disturbances No dizziness, slurred speech or motor deficits. No injury or fall. No neck pain  Review of Systems See above   Past Medical History:  Diagnosis Date   Breast cancer ([place])    Cancer [facility]) december 2012   right breast   History of breast cancer 07/2011   right   History of chemotherapy 11/2011   Hyperlipidemia    no current med.   Personal history of chemotherapy    2012   Personal history of radiation therapy    2012   SVD (spontaneous vaginal delivery)    x 3    Past Surgical History:  Procedure Laterality Date   BREAST LUMPECTOMY Right 08/2011   CARPAL TUNNEL RELEASE Left 06/19/2013   Procedure: CARPAL TUNNEL RELEASE;  Surgeon: Cammie Sickle., MD;  Location: [place];  Service: Orthopedics;  Laterality: Left;   CESAREAN SECTION     x 1   COLON SURGERY     Hx polyps   LAPAROSCOPIC SALPINGO OOPHERECTOMY Bilateral 04/13/2016   Procedure: LAPAROSCOPIC SALPINGO OOPHORECTOMY;  Surgeon: Terrance Mass, MD;  Location: [facility];  Service: Gynecology;  Laterality: Bilateral;   PORT-A-CATH REMOVAL  06/2012   PORTACATH PLACEMENT  2013   SENTINEL LYMPH NODE BIOPSY Right 08/2011   WISDOM TOOTH EXTRACTION      Current Outpatient Medications  Medication Instructions   Ascorbic Acid (VITAMIN C PO) 1 tablet, Oral, Daily, Unknown strength   BIOTIN PO 1 tablet, Oral, Daily, Unknown strenght   cholecalciferol  (VITAMIN D3) 1,000 Units, Oral, Daily   diclofenac Sodium (PENNSAID) 2 g, Topical, 2 times daily PRN   rosuvastatin (CRESTOR) 5 mg, Oral, Daily   valACYclovir (VALTREX) 1000 MG tablet For each episode, take 2 tablets twice a day for 1 day   zinc gluconate 50 mg, Oral, Daily       Objective:   Physical Exam BP 130/82   Pulse 74   Temp 98.4 F (36.9 C) (Oral)   Resp 16   Ht 4' 9.5" (1.461 m)   Wt 176 lb 4 oz (79.9 kg)   SpO2 98%   BMI 37.48 kg/m  General:   Well developed, NAD, BMI noted. HEENT:  Normocephalic . Face symmetric, atraumatic Neck: Range of motion normal without limitations Lungs:  CTA B Normal respiratory effort, no intercostal retractions, no accessory muscle use. Heart: RRR,  no murmur.  Lower extremities: no pretibial edema bilaterally  Skin: Not pale. Not jaundice Scalp: Normal to inspection and palpation. Neurologic:  alert & oriented X3.  Speech normal, gait appropriate for age and unassisted.  EOMI.  Motor symmetric. Psych--  Cognition and judgment appear intact.  Cooperative with normal attention span and concentration.  Behavior appropriate. No anxious or depressed appearing.      Assessment    Assessment  (New patient 11-2016) Hyperlipidemia Breast cancer R, 2012, lumpectomy, chemotherapy, s/p Port-A-Cath; d/c anastrazole 2018; d/c from  oncology, f/u w/ PCP, will need yearly MMG and clinical breast exam Lipoma, left abdomen. Reactive airway disease Herpes labialis- valtrex prn Coronary calcium score: 0 (04-2021)  PLAN  Paresthesias: As described above, symptoms are atypical, on clinical grounds no suspicions for a CNS issue, shingles, etc. Rec observation, she will come back for a physical exam in October and if symptoms persist consider further eval. Patient will call if symptoms severe or changing.

## 2022-05-27 NOTE — Assessment & Plan Note (Signed)
Paresthesias: As described above, symptoms are atypical, on clinical grounds no suspicions for a CNS issue, shingles, etc. Rec observation, she will come back for a physical exam in October and if symptoms persist consider further eval. Patient will call if symptoms severe or changing.

## 2022-05-31 ENCOUNTER — Ambulatory Visit: Payer: Federal, State, Local not specified - PPO | Admitting: Internal Medicine

## 2022-06-01 DIAGNOSIS — D485 Neoplasm of uncertain behavior of skin: Secondary | ICD-10-CM | POA: Diagnosis not present

## 2022-06-01 DIAGNOSIS — L218 Other seborrheic dermatitis: Secondary | ICD-10-CM | POA: Diagnosis not present

## 2022-06-01 DIAGNOSIS — D2339 Other benign neoplasm of skin of other parts of face: Secondary | ICD-10-CM | POA: Diagnosis not present

## 2022-06-01 DIAGNOSIS — L570 Actinic keratosis: Secondary | ICD-10-CM | POA: Diagnosis not present

## 2022-06-23 DIAGNOSIS — L218 Other seborrheic dermatitis: Secondary | ICD-10-CM | POA: Diagnosis not present

## 2022-06-23 DIAGNOSIS — L738 Other specified follicular disorders: Secondary | ICD-10-CM | POA: Diagnosis not present

## 2022-07-12 ENCOUNTER — Encounter: Payer: Self-pay | Admitting: Internal Medicine

## 2022-07-12 ENCOUNTER — Ambulatory Visit (INDEPENDENT_AMBULATORY_CARE_PROVIDER_SITE_OTHER): Payer: Federal, State, Local not specified - PPO | Admitting: Internal Medicine

## 2022-07-12 VITALS — BP 124/78 | HR 71 | Temp 98.2°F | Resp 18 | Ht <= 58 in | Wt 174.4 lb

## 2022-07-12 DIAGNOSIS — R739 Hyperglycemia, unspecified: Secondary | ICD-10-CM | POA: Diagnosis not present

## 2022-07-12 DIAGNOSIS — E559 Vitamin D deficiency, unspecified: Secondary | ICD-10-CM

## 2022-07-12 DIAGNOSIS — Z0000 Encounter for general adult medical examination without abnormal findings: Secondary | ICD-10-CM

## 2022-07-12 DIAGNOSIS — E782 Mixed hyperlipidemia: Secondary | ICD-10-CM

## 2022-07-12 LAB — CBC WITH DIFFERENTIAL/PLATELET
Basophils Absolute: 0 10*3/uL (ref 0.0–0.1)
Basophils Relative: 0.4 % (ref 0.0–3.0)
Eosinophils Absolute: 0.2 10*3/uL (ref 0.0–0.7)
Eosinophils Relative: 3 % (ref 0.0–5.0)
HCT: 42.7 % (ref 36.0–46.0)
Hemoglobin: 14 g/dL (ref 12.0–15.0)
Lymphocytes Relative: 40.6 % (ref 12.0–46.0)
Lymphs Abs: 2.7 10*3/uL (ref 0.7–4.0)
MCHC: 32.8 g/dL (ref 30.0–36.0)
MCV: 89.8 fl (ref 78.0–100.0)
Monocytes Absolute: 0.4 10*3/uL (ref 0.1–1.0)
Monocytes Relative: 6 % (ref 3.0–12.0)
Neutro Abs: 3.4 10*3/uL (ref 1.4–7.7)
Neutrophils Relative %: 50 % (ref 43.0–77.0)
Platelets: 241 10*3/uL (ref 150.0–400.0)
RBC: 4.75 Mil/uL (ref 3.87–5.11)
RDW: 13.7 % (ref 11.5–15.5)
WBC: 6.7 10*3/uL (ref 4.0–10.5)

## 2022-07-12 LAB — LIPID PANEL
Cholesterol: 271 mg/dL — ABNORMAL HIGH (ref 0–200)
HDL: 58.2 mg/dL (ref >39.00)
LDL Cholesterol: 179 mg/dL — ABNORMAL HIGH (ref 0–99)
NonHDL: 212.67
Total CHOL/HDL Ratio: 5
Triglycerides: 168 mg/dL — ABNORMAL HIGH (ref 0.0–149.0)
VLDL: 33.6 mg/dL (ref 0.0–40.0)

## 2022-07-12 LAB — COMPREHENSIVE METABOLIC PANEL
ALT: 19 U/L (ref 0–35)
AST: 21 U/L (ref 0–37)
Albumin: 4.1 g/dL (ref 3.5–5.2)
Alkaline Phosphatase: 69 U/L (ref 39–117)
BUN: 12 mg/dL (ref 6–23)
CO2: 27 mEq/L (ref 19–32)
Calcium: 9.1 mg/dL (ref 8.4–10.5)
Chloride: 106 mEq/L (ref 96–112)
Creatinine, Ser: 0.56 mg/dL (ref 0.40–1.20)
GFR: 90.91 mL/min (ref >60.00)
Glucose, Bld: 88 mg/dL (ref 70–99)
Potassium: 4.4 mEq/L (ref 3.5–5.1)
Sodium: 142 mEq/L (ref 135–145)
Total Bilirubin: 0.8 mg/dL (ref 0.2–1.2)
Total Protein: 6.3 g/dL (ref 6.0–8.3)

## 2022-07-12 LAB — VITAMIN D 25 HYDROXY (VIT D DEFICIENCY, FRACTURES): VITD: 19.12 ng/mL — ABNORMAL LOW (ref 30.00–100.00)

## 2022-07-12 LAB — HEMOGLOBIN A1C: Hgb A1c MFr Bld: 6 % (ref 4.6–6.5)

## 2022-07-12 NOTE — Assessment & Plan Note (Signed)
--  Td 2014 - Prevnar 2018;  pnm 23: 2019 - zostavax 2015 - shingrix , covid vax, RSV, PNM 20 : D/W pt, pros>cons, declined. --Female care: s/p B oophorectomy, no hysterectomy, PAP 02-22-2022 ([facility]) *breast CA - last MMG 01-2022([facility]) --Family and  personal history breast cancer, s/p genetics eval ~ 2012.  Reeval by genetics: Declined   . --CCS:  Cscope  2010 in [place], had a flat plaque @ rectum.   cscope 06-2012, Dr Elbert Ewings, no polyps  cscope 08-07-2019 (per pt and [facility], no report copy). Pt told to RTC 5-7 years  - labs: CMP FLP CBC A1c.  R/o vitamin D deficiency: Check levels. --Diet and exercise discussed. -- H-POA : see AVS

## 2022-07-12 NOTE — Progress Notes (Signed)
Subjective:    Patient ID: Nichole [name], female    DOB: 01-05-1951, 71 y.o.   MRN: [id number]  DOS:  07/12/2022 Type of visit - description: cpx  Since the last office visit is doing well. Was seen with paresthesias, that has improved.   Review of Systems  Other than above, a 14 point review of systems is negative    Past Medical History:  Diagnosis Date   Breast cancer ([place])    Cancer [facility]) december 2012   right breast   History of breast cancer 07/2011   right   History of chemotherapy 11/2011   Hyperlipidemia    no current med.   Personal history of chemotherapy    2012   Personal history of radiation therapy    2012   SVD (spontaneous vaginal delivery)    x 3    Past Surgical History:  Procedure Laterality Date   BREAST LUMPECTOMY Right 08/2011   CARPAL TUNNEL RELEASE Left 06/19/2013   Procedure: CARPAL TUNNEL RELEASE;  Surgeon: Cammie Sickle., MD;  Location: [place];  Service: Orthopedics;  Laterality: Left;   CESAREAN SECTION     x 1   COLON SURGERY     Hx polyps   LAPAROSCOPIC SALPINGO OOPHERECTOMY Bilateral 04/13/2016   Procedure: LAPAROSCOPIC SALPINGO OOPHORECTOMY;  Surgeon: Terrance Mass, MD;  Location: [facility];  Service: Gynecology;  Laterality: Bilateral;   PORT-A-CATH REMOVAL  06/2012   PORTACATH PLACEMENT  2013   SENTINEL LYMPH NODE BIOPSY Right 08/2011   WISDOM TOOTH EXTRACTION     Social History   Socioeconomic History   Marital status: Married    Spouse name: Not on file   Number of children: 4   Years of education: Not on file   Highest education level: Not on file  Occupational History   Occupation: retired from the [employer]   Smoking status: Former    Years: 3.00    Types: Cigarettes    Quit date: 09/13/2001    Years since quitting: 20.8   Smokeless tobacco: Never   Tobacco comments:    social-light smoker  Vaping Use   Vaping Use: Never used  Substance and Sexual Activity   Alcohol  use: No   Drug use: No   Sexual activity: Not Currently    Partners: Male    Birth control/protection: Post-menopausal, None    Comment: 1st intercourse- 16, partners- 20, married- 71 yrs   Other Topics Concern   Not on file  Social History Narrative   Original from [place]   Lives w/ husband, travels frequently   Social Determinants of Health   Financial Resource Strain: Not on file  Food Insecurity: Not on file  Transportation Needs: Not on file  Physical Activity: Not on file  Stress: Not on file  Social Connections: Not on file  Intimate Partner Violence: Not on file    Current Outpatient Medications  Medication Instructions   Ascorbic Acid (VITAMIN C PO) 1 tablet, Oral, Daily, Unknown strength   BIOTIN PO 1 tablet, Oral, Daily, Unknown strenght   cholecalciferol (VITAMIN D3) 1,000 Units, Oral, Daily   diclofenac Sodium (PENNSAID) 2 g, Topical, 2 times daily PRN   fluocinonide (LIDEX) 0.05 % external solution Use as directed   ketoconazole (NIZORAL) 2 % shampoo Use as directed   Multiple Vitamin (MULTIVITAMIN WITH MINERALS) TABS tablet 1 tablet, Oral, Daily   rosuvastatin (CRESTOR) 5 mg, Oral, Daily   valACYclovir (VALTREX)  1000 MG tablet For each episode, take 2 tablets twice a day for 1 day   zinc gluconate 50 mg, Oral, Daily       Objective:   Physical Exam BP 124/78   Pulse 71   Temp 98.2 F (36.8 C) (Oral)   Resp 18   Ht 4' 9.5" (1.461 m)   Wt 174 lb 6 oz (79.1 kg)   SpO2 96%   BMI 37.08 kg/m  General: Well developed, NAD, BMI noted Neck: No  thyromegaly  HEENT:  Normocephalic . Face symmetric, atraumatic Lungs:  CTA B Normal respiratory effort, no intercostal retractions, no accessory muscle use. Heart: RRR,  no murmur.  Abdomen:  Not distended, soft, non-tender. No rebound or rigidity.   Lower extremities: no pretibial edema bilaterally  Skin: Exposed areas without rash. Not pale. Not jaundice Neurologic:  alert & oriented X3.  Speech normal,  gait appropriate for age and unassisted Strength symmetric and appropriate for age.  Psych: Cognition and judgment appear intact.  Cooperative with normal attention span and concentration.  Behavior appropriate. No anxious or depressed appearing.     Assessment     Assessment  (New patient 11-2016) Hyperlipidemia Breast cancer R, 2012, lumpectomy, chemotherapy, s/p Port-A-Cath; d/c anastrazole 2018; d/c from oncology, f/u w/ PCP, will need yearly MMG and clinical breast exam Lipoma, left abdomen. Reactive airway disease Herpes labialis- valtrex prn Coronary calcium score: 0 (04-2021)  PLAN  Here for CPX Hyperlipidemia: On rosuvastatin, checking labs. Personal and family history of breast cancer: Follow-up with GYN, last genetic evaluation 2012, reevaluation?  Declined. Paresthesias: See last visit, had paresthesias at the scalp, follow-up by dermatology now, symptoms have decreased. RTC 1 year

## 2022-07-12 NOTE — Assessment & Plan Note (Signed)
Here for CPX Hyperlipidemia: On rosuvastatin, checking labs. Personal and family history of breast cancer: Follow-up with GYN, last genetic evaluation 2012, reevaluation?  Declined. Paresthesias: See last visit, had paresthesias at the scalp, follow-up by dermatology now, symptoms have decreased. RTC 1 year

## 2022-07-12 NOTE — Patient Instructions (Signed)
   GO TO THE LAB : Get the blood work     [place], [place] Come back for   a physical exam in 1 year    Advanced care planning:  Do you have a "Living will", "[place] of attorney" ?   If you already have a living will or healthcare power of attorney, is recommended you bring the copy to be scanned in your chart. The document will be available to all the doctors you see in the system.  If you don't have one, please consider create one.  Advance care planning is a process that supports adults in  understanding and sharing their preferences regarding future medical care.   The patient's preferences are recorded in documents called Advance Directives.    Advanced directives are completed (and can be modified at any time) while the patient is in full mental capacity.   The documentation should be available at all times to the patient, the family and the healthcare providers.   This legal documents direct treatment decision making and/or appoint a surrogate to make the decision if the patient is not capable to do so.    Advance directives can be documented in many types of formats,  documents have names such as:  Lliving will  Durable power of attorney for healthcare (healthcare proxy or healthcare power of attorney)  Combined directives  Physician orders for life-sustaining treatment    More information at:  [email or web address]

## 2022-07-14 MED ORDER — ROSUVASTATIN CALCIUM 5 MG PO TABS: 5.0000 mg | ORAL_TABLET | Freq: Every day | ORAL | 1 refills | Status: DC

## 2022-07-14 MED ORDER — VITAMIN D (ERGOCALCIFEROL) 1.25 MG (50000 UNIT) PO CAPS: 50000.0000 [IU] | ORAL_CAPSULE | ORAL | 0 refills | Status: DC

## 2022-07-14 NOTE — Addendum Note (Signed)
Addended byDamita Dunnings D on: 07/14/2022 09:06 AM   Modules accepted: Orders

## 2022-07-14 NOTE — Addendum Note (Signed)
Addended byDamita Dunnings D on: 07/14/2022 09:08 AM   Modules accepted: Orders

## 2022-08-22 IMAGING — MR MR KNEE*R* W/O CM
6 series · 40 of 40 positions shown · non-contrast
Comparison: X-ray 12/23/2020

CLINICAL DATA: Chronic knee pain

EXAM:
MRI OF THE RIGHT KNEE WITHOUT CONTRAST
TECHNIQUE: Multiplanar, multisequence MR imaging of the knee was performed. No
intravenous contrast was administered.

[Series 5: T2 fat-sat · axial · right · 4.0mm · 0.50mm/px · z∈[-60,+53]mm · 7 of 27 slices shown (1 of 3)]
[im 1/27]
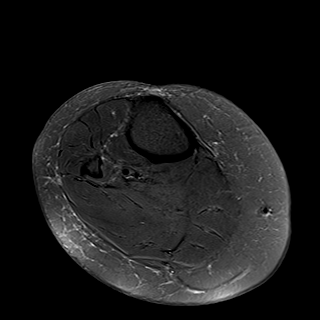
[im 5/27]
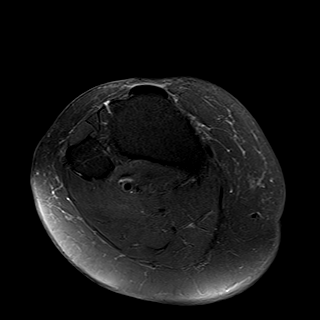
[im 9/27]
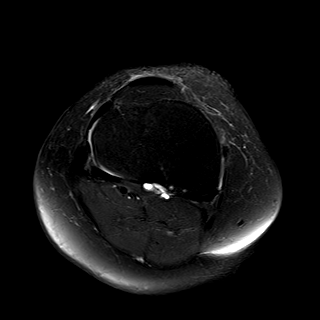
[im 14/27]
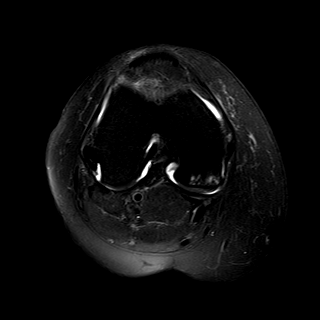
[im 18/27]
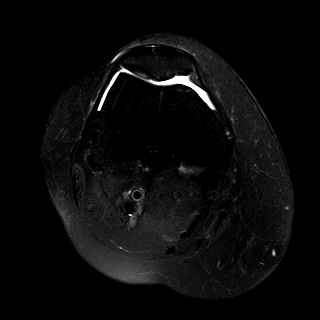
[im 22/27]
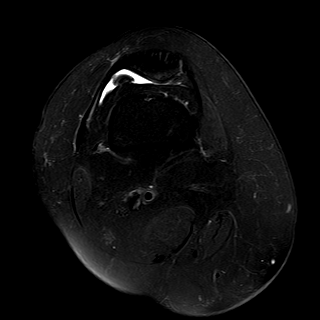
[im 27/27]
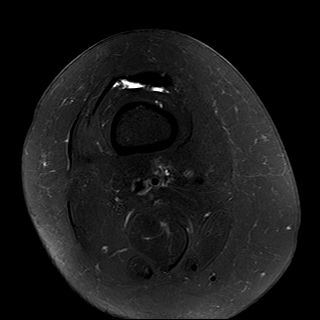

[Series 6: T2 fat-sat · coronal · right · 4.0mm · 0.47mm/px · 6 of 23 slices shown (2 of 3)]
[im 1/23]
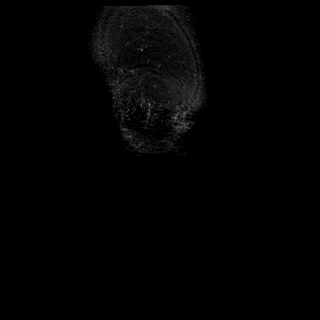
[im 5/23]
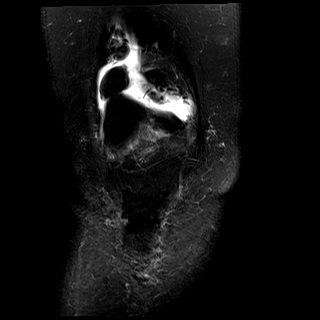
[im 9/23]
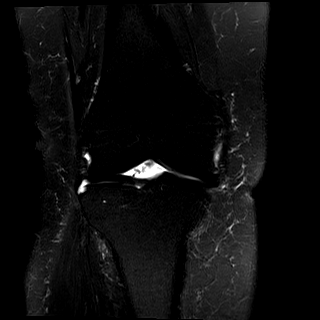
[im 14/23]
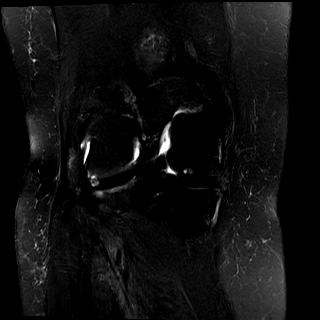
[im 18/23]
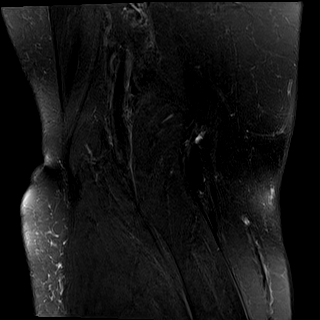
[im 23/23]
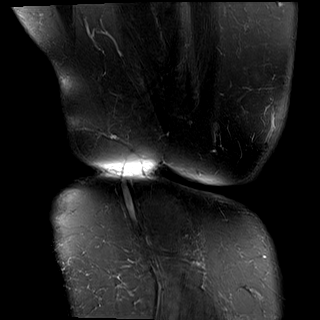

[Series 7: T1 · coronal · right · 4.0mm · 0.47mm/px · 6 of 23 slices shown]
[im 1/23]
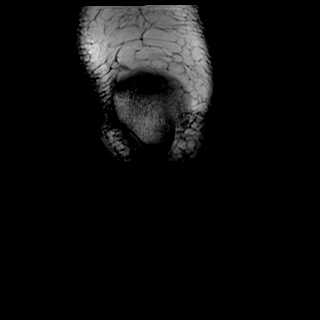
[im 5/23]
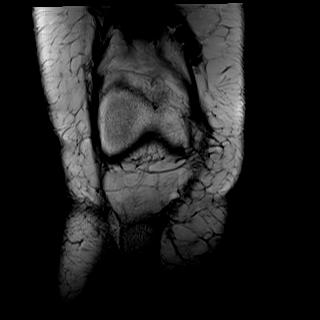
[im 9/23]
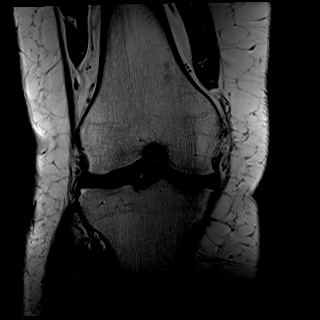
[im 14/23]
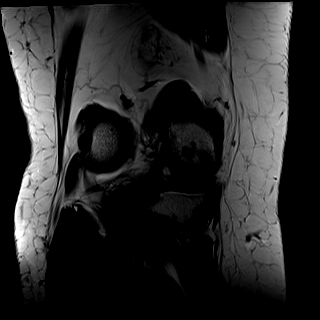
[im 18/23]
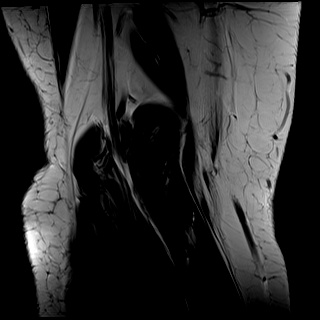
[im 23/23]
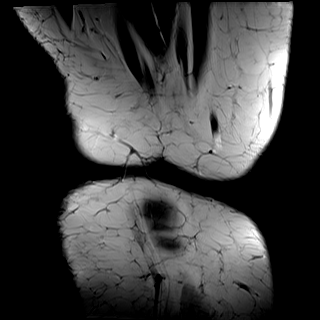

[Series 8: PD fat-sat · coronal · right · 3.0mm · 0.47mm/px · 7 of 28 slices shown (1 of 2)]
[im 1/28]
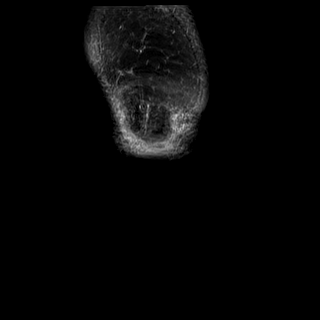
[im 5/28]
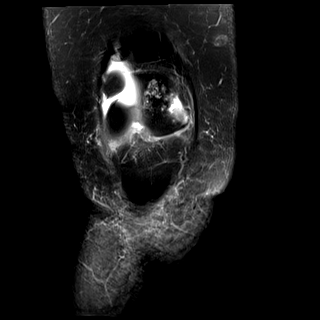
[im 10/28]
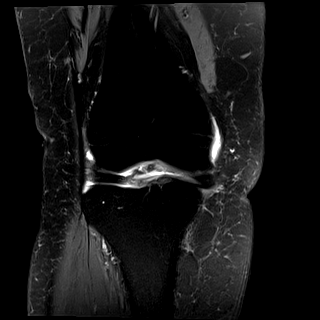
[im 14/28]
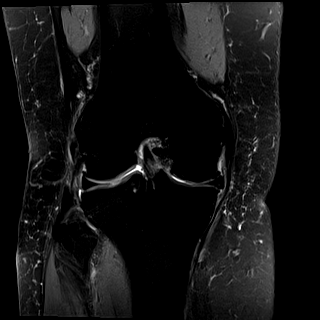
[im 19/28]
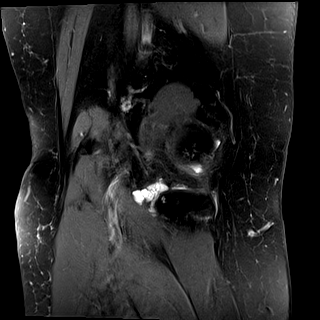
[im 23/28]
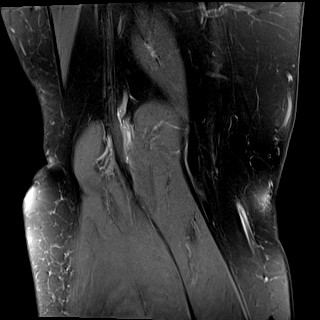
[im 28/28]
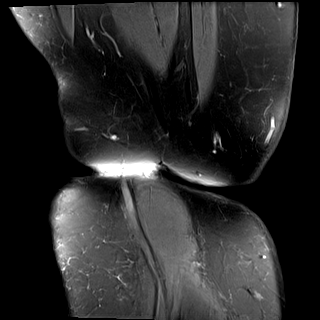

[Series 9: PD fat-sat · sagittal · right · 3.0mm · 0.47mm/px · 7 of 27 slices shown (2 of 2)]
[im 1/27]
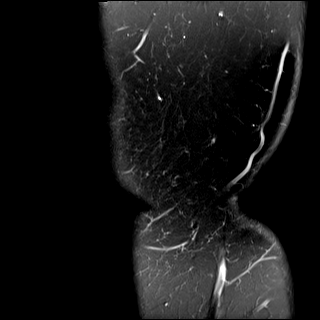
[im 5/27]
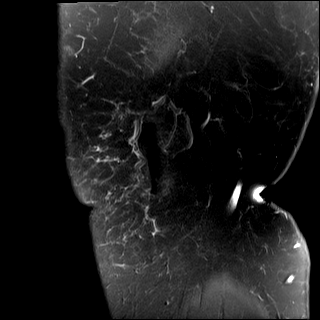
[im 9/27]
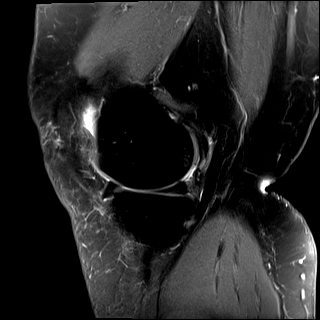
[im 14/27]
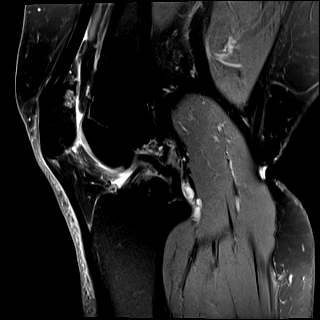
[im 18/27]
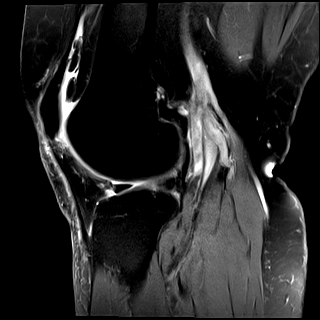
[im 22/27]
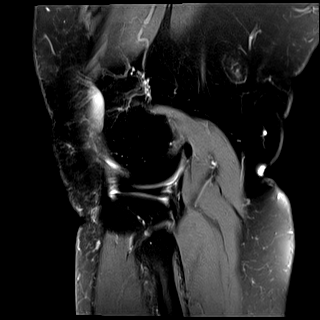
[im 27/27]
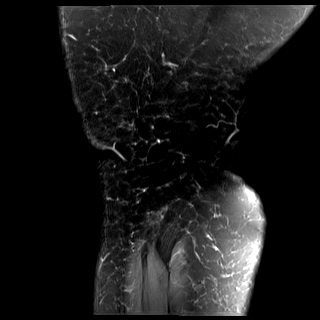

[Series 10: T2 fat-sat · sagittal · right · 3.0mm · 0.47mm/px · 7 of 27 slices shown (3 of 3)]
[im 1/27]
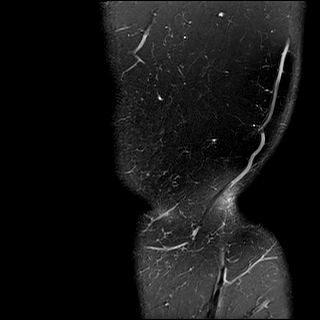
[im 5/27]
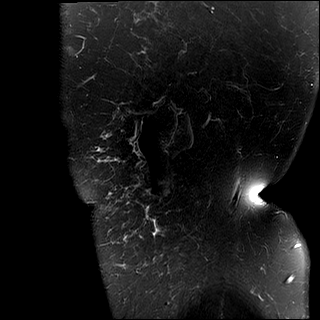
[im 9/27]
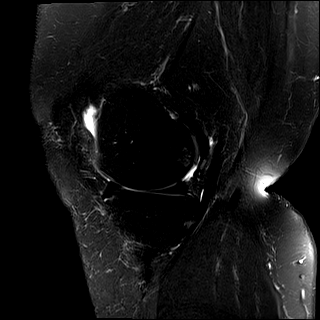
[im 14/27]
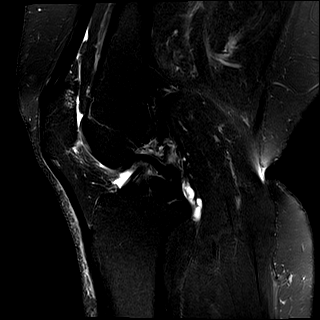
[im 18/27]
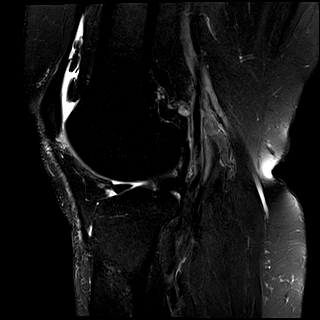
[im 22/27]
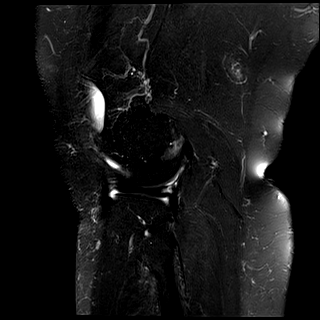
[im 27/27]
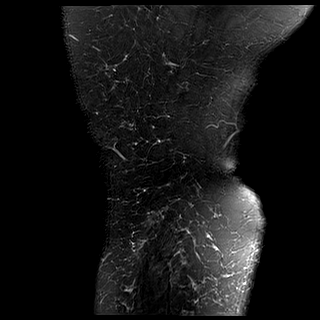

[40 of 40 positions shown; findings below may reference images not displayed]

FINDINGS: MENISCI

Medial meniscus: Intrasubstance degeneration with oblique tear
extending to the inferior articular surface centered at the
posterior horn-body junction (series 8, images 13-14).

Lateral meniscus:  Intact.

LIGAMENTS

Cruciates:  Intact ACL and PCL.

Collaterals: Medial collateral ligament is intact. Lateral
collateral ligament complex is intact.

CARTILAGE

Patellofemoral: Large area of full-thickness cartilage loss centered
at the medial patellar facet and patellar apex measuring
approximately 18 x 13 mm (series 5, images 8-10). Chondral thinning
of the medial trochlea.

Medial: 8 mm near full-thickness cartilage defect along the
posterior aspect of the medial femoral condyle (series 10, image 9).
Mild diffuse chondral thinning and scattered areas of surface
irregularity of the medial femoral condyle.

Lateral: Focal full-thickness cartilage fissure of the lateral
tibial plateau with underlying subchondral cyst formation (series 6,
image 12).

Joint:  Small knee joint effusion.  Mild edema within Hoffa's fat.

Popliteal Fossa:  No Baker cyst. Intact popliteus tendon.

Extensor Mechanism:  Intact quadriceps tendon and patellar tendon.

Bones: Reactive subchondral marrow signal changes within the
superior patella and posterior aspect of the medial femoral condyle.
No acute fracture. No dislocation. No suspicious bone lesion.

Other: Multilobulated cyst along the posterior joint line adjacent
to the PCL tibial attachment site measuring approximately 16 x 6 x
18 mm, likely ganglion.
IMPRESSION: 1. Tricompartmental osteoarthritis, most pronounced within the
patellofemoral and medial tibiofemoral compartments.
2. Intrasubstance degeneration with oblique tear centered at the
posterior horn-body junction of the medial meniscus.
3. Small knee joint effusion.
4. Multilobulated 1.8 cm cyst along the posterior joint line, likely
ganglion cyst.

## 2022-10-14 ENCOUNTER — Encounter: Payer: Self-pay | Admitting: Internal Medicine

## 2022-10-14 DIAGNOSIS — E782 Mixed hyperlipidemia: Secondary | ICD-10-CM

## 2022-10-14 DIAGNOSIS — E559 Vitamin D deficiency, unspecified: Secondary | ICD-10-CM

## 2022-10-14 NOTE — Addendum Note (Signed)
Addended byDamita Dunnings D on: 10/14/2022 01:05 PM   Modules accepted: Orders

## 2022-10-14 NOTE — Telephone Encounter (Signed)
Needs FLP AST ALT

## 2022-10-14 NOTE — Telephone Encounter (Signed)
Orders placed.

## 2022-10-15 NOTE — Telephone Encounter (Signed)
Vit D added.

## 2022-10-15 NOTE — Telephone Encounter (Signed)
Add vitamin D, DX vitamin D deficiency

## 2022-10-15 NOTE — Addendum Note (Signed)
Addended byDamita Dunnings D on: 10/15/2022 10:39 AM   Modules accepted: Orders

## 2022-10-18 ENCOUNTER — Other Ambulatory Visit (INDEPENDENT_AMBULATORY_CARE_PROVIDER_SITE_OTHER): Payer: Federal, State, Local not specified - PPO

## 2022-10-18 ENCOUNTER — Other Ambulatory Visit: Payer: Self-pay

## 2022-10-18 DIAGNOSIS — R739 Hyperglycemia, unspecified: Secondary | ICD-10-CM | POA: Diagnosis not present

## 2022-10-18 DIAGNOSIS — E559 Vitamin D deficiency, unspecified: Secondary | ICD-10-CM

## 2022-10-18 DIAGNOSIS — E782 Mixed hyperlipidemia: Secondary | ICD-10-CM

## 2022-10-18 LAB — HEMOGLOBIN A1C: Hgb A1c MFr Bld: 5.9 % (ref 4.6–6.5)

## 2022-10-18 LAB — VITAMIN D 25 HYDROXY (VIT D DEFICIENCY, FRACTURES): VITD: 52.83 ng/mL (ref 30.00–100.00)

## 2022-10-18 LAB — LIPID PANEL
Cholesterol: 185 mg/dL (ref 0–200)
HDL: 53.2 mg/dL (ref >39.00)
LDL Cholesterol: 103 mg/dL — ABNORMAL HIGH (ref 0–99)
NonHDL: 131.88
Total CHOL/HDL Ratio: 3
Triglycerides: 142 mg/dL (ref 0.0–149.0)
VLDL: 28.4 mg/dL (ref 0.0–40.0)

## 2022-10-18 LAB — AST: AST: 18 U/L (ref 0–37)

## 2022-10-18 LAB — ALT: ALT: 21 U/L (ref 0–35)

## 2022-10-18 NOTE — Addendum Note (Signed)
Addended by: Manuela Schwartz on: 10/18/2022 08:24 AM   Modules accepted: Orders

## 2023-01-10 ENCOUNTER — Other Ambulatory Visit: Payer: Self-pay | Admitting: Internal Medicine

## 2023-01-11 ENCOUNTER — Other Ambulatory Visit: Payer: Self-pay | Admitting: Internal Medicine

## 2023-01-11 DIAGNOSIS — Z1231 Encounter for screening mammogram for malignant neoplasm of breast: Secondary | ICD-10-CM

## 2023-02-02 DIAGNOSIS — N390 Urinary tract infection, site not specified: Secondary | ICD-10-CM | POA: Diagnosis not present

## 2023-02-02 DIAGNOSIS — B9620 Unspecified Escherichia coli [E. coli] as the cause of diseases classified elsewhere: Secondary | ICD-10-CM | POA: Diagnosis not present

## 2023-02-02 DIAGNOSIS — N3944 Nocturnal enuresis: Secondary | ICD-10-CM | POA: Diagnosis not present

## 2023-02-02 DIAGNOSIS — N3941 Urge incontinence: Secondary | ICD-10-CM | POA: Diagnosis not present

## 2023-02-08 ENCOUNTER — Ambulatory Visit
Admission: RE | Admit: 2023-02-08 | Discharge: 2023-02-08 | Disposition: A | Discharge status: Home / Self Care | Payer: Federal, State, Local not specified - PPO | Source: Ambulatory Visit | Attending: Internal Medicine | Admitting: Internal Medicine

## 2023-02-08 DIAGNOSIS — Z1231 Encounter for screening mammogram for malignant neoplasm of breast: Secondary | ICD-10-CM

## 2023-02-24 ENCOUNTER — Encounter: Payer: Self-pay | Admitting: Obstetrics & Gynecology

## 2023-02-24 ENCOUNTER — Ambulatory Visit (INDEPENDENT_AMBULATORY_CARE_PROVIDER_SITE_OTHER): Payer: Federal, State, Local not specified - PPO | Admitting: Obstetrics & Gynecology

## 2023-02-24 VITALS — BP 110/72 | HR 94 | Ht <= 58 in | Wt 173.0 lb

## 2023-02-24 DIAGNOSIS — Z01419 Encounter for gynecological examination (general) (routine) without abnormal findings: Secondary | ICD-10-CM

## 2023-02-24 DIAGNOSIS — Z780 Asymptomatic menopausal state: Secondary | ICD-10-CM | POA: Diagnosis not present

## 2023-02-24 DIAGNOSIS — Z853 Personal history of malignant neoplasm of breast: Secondary | ICD-10-CM

## 2023-02-24 DIAGNOSIS — Z87440 Personal history of urinary (tract) infections: Secondary | ICD-10-CM

## 2023-02-24 DIAGNOSIS — B009 Herpesviral infection, unspecified: Secondary | ICD-10-CM | POA: Diagnosis not present

## 2023-02-24 DIAGNOSIS — Z9189 Other specified personal risk factors, not elsewhere classified: Secondary | ICD-10-CM | POA: Diagnosis not present

## 2023-02-24 DIAGNOSIS — Z170 Estrogen receptor positive status [ER+]: Secondary | ICD-10-CM

## 2023-02-24 DIAGNOSIS — M8588 Other specified disorders of bone density and structure, other site: Secondary | ICD-10-CM

## 2023-02-24 DIAGNOSIS — Z6837 Body mass index (BMI) 37.0-37.9, adult: Secondary | ICD-10-CM

## 2023-02-24 LAB — CULTURE INDICATED

## 2023-02-24 NOTE — Progress Notes (Signed)
Nichole [name] 04/03/1951 [id number]   History:    72 y.o.  G4P4L4 Married.  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  Status post right breast lumpectomy, radiation therapy, chemotherapy, tamoxifen, and Anastrozole which she finished October 2018. Had a laparoscopic bilateral salpingo-oophorectomy April 13, 2016.  Screening mammo Neg 01/2023.  Postmenopause, well on no hormone replacement therapy.  No postmenopausal bleeding.  No pelvic pain.  Abstinent, husband has ED.  Pap Neg 02/2022.  Repeat Pap at 3 years. Had a Cystitis treated 2 weeks ago.  U/A, reflex U. Culture today.  Body mass index 37.11.  Decrease portions and carbs.  Needs to walk more.  Bowel movements normal. Health labs with Dr. Drue Novel.  Colonoscopy 2021.  Bone Density Osteopenia -1.4 Lt distal Radius 11/2019, [facility]   Past medical history,surgical history, family history and social history were all reviewed and documented in the EPIC chart.  Gynecologic History No LMP recorded. Patient is postmenopausal.  Obstetric History OB History  Gravida Para Term Preterm AB Living  4 4 4     4   SAB IAB Ectopic Multiple Live Births          4    # Outcome Date GA Lbr Len/2nd Weight Sex Delivery Anes PTL Lv  4 Term     M CS-Unspec  N LIV  3 Term     M Vag-Spont  N LIV  2 Term     M Vag-Spont  N LIV  1 Term     M Vag-Spont  N LIV     ROS: A ROS was performed and pertinent positives and negatives are included in the history. GENERAL: No fevers or chills. HEENT: No change in vision, no earache, sore throat or sinus congestion. NECK: No pain or stiffness. CARDIOVASCULAR: No chest pain or pressure. No palpitations. PULMONARY: No shortness of breath, cough or wheeze. GASTROINTESTINAL: No abdominal pain, nausea, vomiting or diarrhea, melena or bright red blood per  rectum. GENITOURINARY: No urinary frequency, urgency, hesitancy or dysuria. MUSCULOSKELETAL: No joint or muscle pain, no back pain, no recent trauma. DERMATOLOGIC: No rash, no itching, no lesions. ENDOCRINE: No polyuria, polydipsia, no heat or cold intolerance. No recent change in weight. HEMATOLOGICAL: No anemia or easy bruising or bleeding. NEUROLOGIC: No headache, seizures, numbness, tingling or weakness. PSYCHIATRIC: No depression, no loss of interest in normal activity or change in sleep pattern.     Exam:   BP 110/72   Pulse 94   Ht 4' 9.25" (1.454 m)   Wt 173 lb (78.5 kg)   SpO2 97%   BMI 37.11 kg/m   Body mass index is 37.11 kg/m.  General appearance : Well developed well nourished female. No acute distress HEENT: Eyes: no retinal hemorrhage or exudates,  Neck supple, trachea midline, no carotid bruits, no thyroidmegaly Lungs: Clear to auscultation, no rhonchi or wheezes, or rib retractions  Heart: Regular rate and rhythm, no murmurs or gallops Breast:Examined in sitting and supine position were symmetrical in appearance, no palpable masses or tenderness,  no skin retraction, no nipple inversion, no nipple discharge, no skin discoloration, no axillary or supraclavicular lymphadenopathy Abdomen: no palpable masses or tenderness, no rebound or guarding Extremities: no edema or skin discoloration or tenderness  Pelvic: Vulva: Normal  Vagina: No gross lesions or discharge  Cervix: No gross lesions or discharge  Uterus  AV, normal size, shape and consistency, non-tender and mobile  Adnexa  Without masses or tenderness  Anus: Normal  U/A:  Dark yellow cloudy, Nit Neg, Pro Trace, WBC 0-5, RBC 0-2, Bacteria Few.  U. Culture pending.   Assessment/Plan:  72 y.o. female for annual exam   1. 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  Status post right breast lumpectomy,  radiation therapy, chemotherapy, tamoxifen, and Anastrozole which she finished October 2018. Had a laparoscopic bilateral salpingo-oophorectomy April 13, 2016.  Screening mammo Neg 01/2023.  Postmenopause, well on no hormone replacement therapy.  No postmenopausal bleeding.  No pelvic pain.  Abstinent, husband has ED.  Pap Neg 02/2022.  Repeat Pap at 3 years. Had a Cystitis treated 2 weeks ago.  U/A, reflex U. Culture today.  Body mass index 37.11.  Decrease portions and carbs.  Needs to walk more.  Bowel movements normal. Health labs with Dr. Drue Novel.  Colonoscopy 2021.  Bone Density Osteopenia -1.4 Lt distal Radius 11/2019, [facility]  2. Postmenopause Postmenopause, well on no hormone replacement therapy.  No postmenopausal bleeding.  No pelvic pain.  Abstinent, husband has ED.  3. Osteopenia of lumbar spine Bone Density Osteopenia -1.4 Lt distal Radius 11/2019, [facility]. - DG Bone Density; Future  4. History of UTI U/A just mildly perturbed, will wait on U. Culture. - Urinalysis,Complete w/RFL Culture  5. Malignant neoplasm of overlapping sites of right breast in female, estrogen receptor positive (HCC)  6. Class 2 severe obesity due to excess calories with serious comorbidity and body mass index (BMI) of 37.0 to 37.9 in adult [facility])  Other orders - mirabegron ER (MYRBETRIQ) 50 MG TB24 tablet; Take 50 mg by mouth daily. - MAGNESIUM PO; Take by mouth.   Genia Del MD, 10:34 AM

## 2023-02-26 LAB — URINALYSIS, COMPLETE W/RFL CULTURE
Bilirubin Urine: NEGATIVE
Glucose, UA: NEGATIVE
Hyaline Cast: NONE SEEN /LPF
Nitrites, Initial: NEGATIVE
Specific Gravity, Urine: 1.015 (ref 1.001–1.035)
pH: 6 (ref 5.0–8.0)

## 2023-02-26 LAB — URINE CULTURE
MICRO NUMBER:: 15078879
SPECIMEN QUALITY:: ADEQUATE

## 2023-02-28 NOTE — Telephone Encounter (Signed)
Order filled out and placed on Dr. Sharol Roussel desk for authorization.

## 2023-02-28 NOTE — Telephone Encounter (Signed)
Will route to provider for review. Advised pt of ucx results from visit on 02/24/23.

## 2023-03-11 DIAGNOSIS — M8588 Other specified disorders of bone density and structure, other site: Secondary | ICD-10-CM | POA: Diagnosis not present

## 2023-03-11 DIAGNOSIS — N951 Menopausal and female climacteric states: Secondary | ICD-10-CM | POA: Diagnosis not present

## 2023-03-11 DIAGNOSIS — Z853 Personal history of malignant neoplasm of breast: Secondary | ICD-10-CM | POA: Diagnosis not present

## 2023-03-15 ENCOUNTER — Encounter: Payer: Self-pay | Admitting: Obstetrics & Gynecology

## 2023-04-24 ENCOUNTER — Encounter: Payer: Self-pay | Admitting: Internal Medicine

## 2023-07-09 ENCOUNTER — Other Ambulatory Visit: Payer: Self-pay | Admitting: Internal Medicine

## 2023-07-18 ENCOUNTER — Encounter: Payer: Federal, State, Local not specified - PPO | Admitting: Internal Medicine

## 2023-07-22 ENCOUNTER — Encounter: Payer: Federal, State, Local not specified - PPO | Admitting: Internal Medicine

## 2023-11-18 ENCOUNTER — Ambulatory Visit (INDEPENDENT_AMBULATORY_CARE_PROVIDER_SITE_OTHER): Payer: Federal, State, Local not specified - PPO | Admitting: Internal Medicine

## 2023-11-18 ENCOUNTER — Encounter: Payer: Self-pay | Admitting: Internal Medicine

## 2023-11-18 DIAGNOSIS — E669 Obesity, unspecified: Secondary | ICD-10-CM | POA: Diagnosis not present

## 2023-11-18 DIAGNOSIS — Z1211 Encounter for screening for malignant neoplasm of colon: Secondary | ICD-10-CM

## 2023-11-18 DIAGNOSIS — R739 Hyperglycemia, unspecified: Secondary | ICD-10-CM

## 2023-11-18 DIAGNOSIS — E782 Mixed hyperlipidemia: Secondary | ICD-10-CM | POA: Diagnosis not present

## 2023-11-18 DIAGNOSIS — E559 Vitamin D deficiency, unspecified: Secondary | ICD-10-CM

## 2023-11-18 DIAGNOSIS — Z0000 Encounter for general adult medical examination without abnormal findings: Secondary | ICD-10-CM

## 2023-11-18 DIAGNOSIS — Z0001 Encounter for general adult medical examination with abnormal findings: Secondary | ICD-10-CM

## 2023-11-18 DIAGNOSIS — Z01419 Encounter for gynecological examination (general) (routine) without abnormal findings: Secondary | ICD-10-CM

## 2023-11-18 LAB — CBC WITH DIFFERENTIAL/PLATELET
Basophils Absolute: 0 10*3/uL (ref 0.0–0.1)
Basophils Relative: 0.4 % (ref 0.0–3.0)
Eosinophils Absolute: 0.2 10*3/uL (ref 0.0–0.7)
Eosinophils Relative: 3.1 % (ref 0.0–5.0)
HCT: 45.1 % (ref 36.0–46.0)
Hemoglobin: 14.7 g/dL (ref 12.0–15.0)
Lymphocytes Relative: 38.2 % (ref 12.0–46.0)
Lymphs Abs: 2.9 10*3/uL (ref 0.7–4.0)
MCHC: 32.7 g/dL (ref 30.0–36.0)
MCV: 90 fl (ref 78.0–100.0)
Monocytes Absolute: 0.5 10*3/uL (ref 0.1–1.0)
Monocytes Relative: 6.4 % (ref 3.0–12.0)
Neutro Abs: 4 10*3/uL (ref 1.4–7.7)
Neutrophils Relative %: 51.9 % (ref 43.0–77.0)
Platelets: 271 10*3/uL (ref 150.0–400.0)
RBC: 5.01 Mil/uL (ref 3.87–5.11)
RDW: 13.8 % (ref 11.5–15.5)
WBC: 7.6 10*3/uL (ref 4.0–10.5)

## 2023-11-18 LAB — COMPREHENSIVE METABOLIC PANEL
ALT: 22 U/L (ref 0–35)
AST: 21 U/L (ref 0–37)
Albumin: 4.3 g/dL (ref 3.5–5.2)
Alkaline Phosphatase: 82 U/L (ref 39–117)
BUN: 11 mg/dL (ref 6–23)
CO2: 29 meq/L (ref 19–32)
Calcium: 9.2 mg/dL (ref 8.4–10.5)
Chloride: 104 meq/L (ref 96–112)
Creatinine, Ser: 0.62 mg/dL (ref 0.40–1.20)
GFR: 87.87 mL/min (ref >60.00)
Glucose, Bld: 83 mg/dL (ref 70–99)
Potassium: 4.4 meq/L (ref 3.5–5.1)
Sodium: 143 meq/L (ref 135–145)
Total Bilirubin: 0.8 mg/dL (ref 0.2–1.2)
Total Protein: 6.6 g/dL (ref 6.0–8.3)

## 2023-11-18 LAB — LIPID PANEL
Cholesterol: 224 mg/dL — ABNORMAL HIGH (ref 0–200)
HDL: 61.7 mg/dL (ref >39.00)
LDL Cholesterol: 131 mg/dL — ABNORMAL HIGH (ref 0–99)
NonHDL: 161.93
Total CHOL/HDL Ratio: 4
Triglycerides: 156 mg/dL — ABNORMAL HIGH (ref 0.0–149.0)
VLDL: 31.2 mg/dL (ref 0.0–40.0)

## 2023-11-18 LAB — TSH: TSH: 1.77 u[IU]/mL (ref 0.35–5.50)

## 2023-11-18 LAB — VITAMIN D 25 HYDROXY (VIT D DEFICIENCY, FRACTURES): VITD: 26.68 ng/mL — ABNORMAL LOW (ref 30.00–100.00)

## 2023-11-18 MED ORDER — VALACYCLOVIR HCL 1 G PO TABS
ORAL_TABLET | ORAL | 2 refills | Status: AC
Start: 2023-11-18 — End: ?

## 2023-11-18 NOTE — Patient Instructions (Addendum)
 Vaccines I recommend: Second dose of Shingrix RSV Pneumonia shot (PNM 20) COVID booster Flu shots every fall.  Will refer you to gynecology Will refer you to gastroenterology If you don't  hear from them in the next few days please let us know.     GO TO THE LAB : Get the blood work     Please go to the front desk: Arrange for office visit in 6 months      If you have MyChart, please check frequently in the next few days for your results   "Health Care Power of attorney" (Also know as a  "Living will" or  Advance care planning documents)  If you already have a living will or healthcare power of attorney, is recommended you bring the copy to be scanned in your chart.   The document will be available to all the doctors you see in the system.  If you are over 76 y/o and don't have the document, please read:  Advance care planning is a process that supports adults in  understanding and sharing their preferences regarding future medical care.  The patient's preferences are recorded in documents called Advance Directives and the can be modified at any time while the patient is in full mental capacity.     More information at: [email or web address]

## 2023-11-18 NOTE — Progress Notes (Signed)
 Subjective:    Patient ID: Nichole [name], female    DOB: September 19, 1950, 73 y.o.   MRN: [id number]  DOS:  11/18/2023 Type of visit - description: cpx  Here for CPX. Chronic medical problems addressed. Had a couple of herpes outbreaks, needs a refill on Valtrex. Reports good compliance with cholesterol medication. Occasional upper back pain without radiation to the arms or upper extremity paresthesias.   Review of Systems  Other than above, a 14 point review of systems is negative    Past Medical History:  Diagnosis Date   Breast cancer (HCC)    Cancer (HCC) 08/2011   right breast   History of breast cancer 07/2011   right   History of chemotherapy 11/2011   HSV infection    oral   Hyperlipidemia    no current med.   Personal history of chemotherapy    2012   Personal history of radiation therapy    2012   SVD (spontaneous vaginal delivery)    x 3    Past Surgical History:  Procedure Laterality Date   BREAST LUMPECTOMY Right 08/2011   CARPAL TUNNEL RELEASE Left 06/19/2013   Procedure: CARPAL TUNNEL RELEASE;  Surgeon: Wyn Forster., MD;  Location: [facility];  Service: Orthopedics;  Laterality: Left;   CESAREAN SECTION     x 1   COLON SURGERY     Hx polyps   LAPAROSCOPIC SALPINGO OOPHERECTOMY Bilateral 04/13/2016   Procedure: LAPAROSCOPIC SALPINGO OOPHORECTOMY;  Surgeon: Ok Edwards, MD;  Location: [facility];  Service: Gynecology;  Laterality: Bilateral;   PORT-A-CATH REMOVAL  06/2012   PORTACATH PLACEMENT  2013   SENTINEL LYMPH NODE BIOPSY Right 08/2011   WISDOM TOOTH EXTRACTION     Social History   Socioeconomic History   Marital status: Married    Spouse name: Not on file   Number of children: 4   Years of education: Not on file   Highest education level: Not on file  Occupational History   Occupation: retired from the [employer]  Tobacco Use   Smoking status: Former    Current packs/day: 0.00    Types: Cigarettes    Start  date: 09/13/1998    Quit date: 09/13/2001    Years since quitting: 22.1   Smokeless tobacco: Never   Tobacco comments:       Vaping Use   Vaping status: Never Used  Substance and Sexual Activity   Alcohol use: No   Drug use: No   Sexual activity: Not Currently    Partners: Male    Birth control/protection: Post-menopausal, None    Comment: 1st intercourse- 16, partners- 3  Other Topics Concern   Not on file  Social History Narrative   Original from [place]   Lives w/ husband, travels frequently   Social Drivers of Corporate investment banker Strain: Not on file  Food Insecurity: Not on file  Transportation Needs: Not on file  Physical Activity: Not on file  Stress: Not on file  Social Connections: Not on file  Intimate Partner Violence: Not on file     Current Outpatient Medications  Medication Instructions   cholecalciferol (VITAMIN D3) 1,000 Units, Daily   MAGNESIUM PO Take by mouth.   mirabegron ER (MYRBETRIQ) 50 mg, Daily   Multiple Vitamin (MULTIVITAMIN WITH MINERALS) TABS tablet 1 tablet, Daily   rosuvastatin (CRESTOR) 5 mg, Oral, Daily   valACYclovir (VALTREX) 1000 MG tablet For each episode, take 2 tablets twice  a day for 1 day       Objective:   Physical Exam BP 126/82   Pulse 72   Temp 98.2 F (36.8 C)   Resp 16   Ht 4' 9.25" (1.454 m)   Wt 177 lb 8 oz (80.5 kg)   SpO2 96%   BMI 38.08 kg/m  General: Well developed, NAD, BMI noted Neck: No  thyromegaly  HEENT:  Normocephalic . Face symmetric, atraumatic Lungs:  CTA B Normal respiratory effort, no intercostal retractions, no accessory muscle use. Heart: RRR,  no murmur.  Abdomen:  Not distended, soft, non-tender. No rebound or rigidity.   Lower extremities: no pretibial edema bilaterally  Skin: Exposed areas without rash. Not pale. Not jaundice Neurologic:  alert & oriented X3.  Speech normal, gait appropriate for age and unassisted Strength symmetric and appropriate for age.   Psych: Cognition and judgment appear intact.  Cooperative with normal attention span and concentration.  Behavior appropriate. No anxious or depressed appearing.     Assessment    Assessment  (New patient 11-2016) Hyperlipidemia Breast cancer R, 2012, lumpectomy, chemotherapy, s/p Port-A-Cath; d/c anastrazole 2018; d/c from oncology, f/u w/ PCP, will need yearly MMG and clinical breast exam Lipoma, left abdomen. Reactive airway disease Herpes labialis- valtrex prn Coronary calcium score: 0 (12-979)  PLAN  Here for CPX - Td 2014 - Prevnar 2018;  pnm 23: 2019 - zostavax 2015 -Vaccine advised: shingrix #2; RSV, PNM 20, covid booster , flu shot q fall;  pros>cons, declined. Female care:  --s/p B oophorectomy, no hysterectomy, PAP 02-22-2022 ([facility]); needs a new gyn, referral sent  --- h/o breast CA;  last MMG 01-2023([facility]) --Family and  personal history breast cancer, s/p genetics eval ~ 2012.    --CCS:  Cscope  2010 in [place], had a flat plaque @ rectum.   cscope 06-2012, Dr Sherri Rad, no polyps  cscope 08-07-2019 (per pt and [facility], no report copy). Pt told to RTC 5-7 years.  Referred to GI - labs:  CMP FLP CBC A1c TSH vitamin D --Diet and exercise discussed.  Referred to a nutrition it is -- H-POA : see AVS  --Bones: DEXA per gynecology We also discussed the following Hyperlipidemia: Reports good compliance with meds, labs. Herpes labialis: Refill Valtrex Obesity: Advised about healthy diet with portion control.  States that she does not eat much.  Referred to nutritionist H/o Breast cancer: Encouraged to continue doing mammograms yearly and see gynecology for clinical breast exam. RTC 6 months

## 2023-11-19 ENCOUNTER — Encounter: Payer: Self-pay | Admitting: Internal Medicine

## 2023-11-19 NOTE — Assessment & Plan Note (Signed)
 Here for CPX  We also discussed the following Hyperlipidemia: Reports good compliance with meds, labs. Herpes labialis: Refill Valtrex Obesity: Advised about healthy diet with portion control.  States that she does not eat much.  Referred to nutritionist H/o Breast cancer: Encouraged to continue doing mammograms yearly and see gynecology for clinical breast exam. RTC 6 months

## 2023-11-19 NOTE — Assessment & Plan Note (Signed)
 Here for CPX - Td 2014 - Prevnar 2018;  pnm 23: 2019 - zostavax 2015 -Vaccine advised: shingrix #2; RSV, PNM 20, covid booster , flu shot q fall;  pros>cons, declined. Female care:  --s/p B oophorectomy, no hysterectomy, PAP 02-22-2022 ([facility]); needs a new gyn, referral sent  --- h/o breast CA;  last MMG 01-2023([facility]) --Family and  personal history breast cancer, s/p genetics eval ~ 2012.    --CCS:  Cscope  2010 in [place], had a flat plaque @ rectum.   cscope 06-2012, Dr Sherri Rad, no polyps  cscope 08-07-2019 (per pt and [facility], no report copy). Pt told to RTC 5-7 years.  Referred to GI - labs:  CMP FLP CBC A1c TSH vitamin D --Diet and exercise discussed.  Referred to a nutrition it is -- H-POA : see AVS  --Bones: DEXA per gynecology

## 2023-11-20 ENCOUNTER — Encounter: Payer: Self-pay | Admitting: Internal Medicine

## 2023-11-21 MED ORDER — ROSUVASTATIN CALCIUM 10 MG PO TABS: 10.0000 mg | ORAL_TABLET | Freq: Every day | ORAL | 1 refills | Status: DC

## 2023-11-21 MED ORDER — VITAMIN D (ERGOCALCIFEROL) 1.25 MG (50000 UNIT) PO CAPS
50000.0000 [IU] | ORAL_CAPSULE | ORAL | 0 refills | Status: AC
Start: 2023-11-21 — End: 2024-02-13

## 2023-11-21 NOTE — Addendum Note (Signed)
 Addended by: Conrad Jewett D on: 11/21/2023 07:40 AM   Modules accepted: Orders

## 2024-01-04 ENCOUNTER — Ambulatory Visit: Admitting: Dietician

## 2024-01-13 ENCOUNTER — Other Ambulatory Visit: Payer: Self-pay | Admitting: Internal Medicine

## 2024-02-09 ENCOUNTER — Encounter: Payer: Self-pay | Admitting: Dietician

## 2024-02-09 ENCOUNTER — Encounter: Attending: Internal Medicine | Admitting: Dietician

## 2024-02-09 VITALS — Wt 175.0 lb

## 2024-02-09 DIAGNOSIS — E782 Mixed hyperlipidemia: Secondary | ICD-10-CM | POA: Diagnosis not present

## 2024-02-09 NOTE — Patient Instructions (Signed)
 Goals Established by Pt  Goal 1: go walking 2-3 days per week for 30 minutes.   Goal 2: switch from cooking in butter to cooking in olive oil.   Goal 3: aim to follow the 'plate method' at least once a day.

## 2024-02-09 NOTE — Progress Notes (Signed)
 Medical Nutrition Therapy  Appointment Start time:  24  Appointment End time:  1700  Primary concerns today: cholesterol and weight gain   Referral diagnosis: mixed HLD Preferred learning style: no preference indicated Learning readiness: ready   NUTRITION ASSESSMENT   Anthropometrics   Wt: 175 lb  Clinical Medical Hx: cancer, HLD, prediabetes, vitamin d  deficiency Medications: reviewed Labs: 11/18/23: chol 224, LDL 131, trig 156, vit d 26, A1c 5.9% Notable Signs/Symptoms: none Food Allergies: pork, lactose intolerance.   Lifestyle & Dietary Hx  Pt reports her weight and cholesterol have been going up. Pt reports she would like to make changes to reduce these.   Pt reports she and her husband are retired. Pt reports she is very active in her church, and she has children and grandchildren. Pt reports her husband gets up early and goes walking or golfing, but she does not like to get up as early. Pt reports she does not like any exercise but she is open to walking.   Pt reports she typically eats breakfast and dinner, with nothing in between. Pt reports she occasionally has fruit in between. Pt reports she typically cooks dinner at home.   Pt reports she will be traveling to her home country for 15 days starting July 15th and reports she feels when she is there the meals are very starch-heavy.   Estimated daily fluid intake: 24 oz Supplements: magnesium occasionally, MVI, vitamin D , zinc Sleep: sleep varies Stress / self-care: moderate stress Current average weekly physical activity: ADLs  24-Hr Dietary Recall First Meal: 9am: 1 egg, 2 whole wheat toast with butter and 8 oz orange juice Snack: none Second Meal: none Snack: none Third Meal: 7pm: meat/chicken with mashed potato or rice and salad with dressing OR spaghetti OR soup with bread Snack: none Beverages: 1 c orange juice, water, lemonade 1/day   NUTRITION DIAGNOSIS  NB-1.1 Food and nutrition-related knowledge  deficit As related to lack of prior education by registered dietitian.  As evidenced by pt report.   NUTRITION INTERVENTION  Nutrition education (E-1) on the following topics:   LDL Lowering Nutrition Therapy Soluble fiber and impact on lowering cholesterol: Sources: Oats, barley, beans, lentils, fruits (apples, oranges), vegetables (carrots, broccoli), and flaxseeds. Benefits: Soluble fiber reduces the absorption of cholesterol into your bloodstream. Choose Healthy Unsaturated Fats and Omega 3's. Sources: Olive oil, canola oil, avocados, nuts, and Fatty fish (salmon, trout), walnuts, flaxseeds, and chia seeds. Benefits: Helps raise HDL cholesterol and lower LDL cholesterol. Omega-3s help reduce triglycerides and raise HDL cholesterol. Limit Saturated Fats: Sources: Red meat, full-fat dairy products, butter, and coconut oil. Benefits: Reducing intake helps lower LDL cholesterol levels. Eat Plenty of Fruits and Vegetables. Benefits: High in fiber and antioxidants, which help lower LDL cholesterol. Eat Whole Grains. Sources: Brown rice, whole wheat bread, quinoa, and whole grain pasta. Benefits: Whole grains contain more fiber and nutrients than refined grains. Regular Physical Activity: Aim for at least 30 minutes of moderate-intensity exercise most days of the week.  MyPlate Fruits & Vegetables: Aim to fill half your plate with a variety of fruits and vegetables. They are rich in vitamins, minerals, and fiber, and can help reduce the risk of chronic diseases. Choose a colorful assortment of fruits and vegetables to ensure you get a wide range of nutrients. Grains and Starches: Make at least half of your grain choices whole grains, such as brown rice, whole wheat bread, and oats. Whole grains provide fiber, which aids in digestion and healthy  cholesterol levels. Aim for whole forms of starchy vegetables such as potatoes, sweet potatoes, beans, peas, and corn, which are fiber rich and provide many  vitamins and minerals.  Protein: Incorporate lean sources of protein, such as poultry, fish, beans, nuts, and seeds, into your meals. Protein is essential for building and repairing tissues, staying full, balancing blood sugar, as well as supporting immune function. Dairy: Include low-fat or fat-free dairy products like milk, yogurt, and cheese in your diet. Dairy foods are excellent sources of calcium  and vitamin D , which are crucial for bone health.   Handouts Provided Include  Plate Method  Learning Style & Readiness for Change Teaching method utilized: Visual & Auditory  Demonstrated degree of understanding via: Teach Back  Barriers to learning/adherence to lifestyle change: none  Goals Established by Pt  Goal 1: go walking 2-3 days per week for 30 minutes.   Goal 2: switch from cooking in butter to cooking in olive oil.   Goal 3: aim to follow the 'plate method' at least once a day.    MONITORING & EVALUATION Dietary intake, weekly physical activity, and follow up in 2 months.  Next Steps  Patient is to call for questions.

## 2024-02-13 ENCOUNTER — Encounter: Payer: Self-pay | Admitting: Family Medicine

## 2024-02-13 ENCOUNTER — Ambulatory Visit (HOSPITAL_BASED_OUTPATIENT_CLINIC_OR_DEPARTMENT_OTHER)
Admission: RE | Admit: 2024-02-13 | Discharge: 2024-02-13 | Disposition: A | Discharge status: Home / Self Care | Source: Ambulatory Visit | Attending: Family Medicine | Admitting: Family Medicine

## 2024-02-13 ENCOUNTER — Ambulatory Visit: Admitting: Family Medicine

## 2024-02-13 ENCOUNTER — Ambulatory Visit: Payer: Self-pay | Admitting: Family Medicine

## 2024-02-13 VITALS — BP 130/77 | HR 100 | Temp 97.9°F | Ht <= 58 in | Wt 174.0 lb

## 2024-02-13 DIAGNOSIS — R051 Acute cough: Secondary | ICD-10-CM | POA: Insufficient documentation

## 2024-02-13 DIAGNOSIS — R059 Cough, unspecified: Secondary | ICD-10-CM | POA: Diagnosis not present

## 2024-02-13 DIAGNOSIS — R062 Wheezing: Secondary | ICD-10-CM | POA: Diagnosis not present

## 2024-02-13 DIAGNOSIS — R9389 Abnormal findings on diagnostic imaging of other specified body structures: Secondary | ICD-10-CM

## 2024-02-13 DIAGNOSIS — R918 Other nonspecific abnormal finding of lung field: Secondary | ICD-10-CM | POA: Diagnosis not present

## 2024-02-13 MED ORDER — DOXYCYCLINE HYCLATE 100 MG PO TABS: 100.0000 mg | ORAL_TABLET | Freq: Two times a day (BID) | ORAL | 0 refills | Status: AC

## 2024-02-13 MED ORDER — GUAIFENESIN ER 600 MG PO TB12: 1200.0000 mg | ORAL_TABLET | Freq: Two times a day (BID) | ORAL | 0 refills | Status: AC

## 2024-02-13 MED ORDER — ALBUTEROL SULFATE HFA 108 (90 BASE) MCG/ACT IN AERS: 2.0000 | INHALATION_SPRAY | Freq: Four times a day (QID) | RESPIRATORY_TRACT | 0 refills | Status: AC | PRN

## 2024-02-13 NOTE — Progress Notes (Signed)
 Acute Office Visit  Subjective:     Patient ID: Nichole [name], female    DOB: May 10, 1951, 73 y.o.   MRN: [id number]  Chief Complaint  Patient presents with   Cough     Patient is in today for cough, sinus congestion.   Discussed the use of AI scribe software for clinical note transcription with the patient, who gave verbal consent to proceed.  History of Present Illness Nichole [name] is a 73 year old female who presents with respiratory symptoms including cough, wheezing, and sinus pain.  She has been feeling unwell for about a week, with symptoms starting after being out in the rain. She experiences chills, headaches, and a persistent cough. The cough produces dark brown sputum, but no blood. No fever, but she notes feeling chilled.  She describes sinus pain and pressure, particularly in the maxillary and frontal regions bilaterally. She also experiences a runny nose that comes and goes.  She reports wheezing, describing it as a noise like a 'motor.' No shortness of breath at rest, but the wheezing worsens at night.  She has been taking an over-the-counter cough syrup and Tylenol , which helps temporarily. She has not been on any recent antibiotics and is allergic to penicillin, which causes a rash under her arms and breasts. She has previously tolerated Keflex without issues.    All review of systems negative except what is listed in the HPI      Objective:    BP 130/77   Pulse 100   Temp 97.9 F (36.6 C) (Oral)   Ht 4' 9.25" (1.454 m)   Wt 174 lb (78.9 kg)   SpO2 94%   BMI 37.33 kg/m    Physical Exam Vitals reviewed.  Constitutional:      Appearance: Normal appearance.  HENT:     Head: Normocephalic and atraumatic.     Right Ear: Tympanic membrane normal.     Left Ear: Tympanic membrane normal.     Nose: Congestion present.     Right Sinus: Maxillary sinus tenderness and frontal sinus tenderness present.     Left Sinus: Frontal sinus tenderness  present.     Mouth/Throat:     Mouth: Mucous membranes are moist.     Pharynx: Oropharynx is clear.  Cardiovascular:     Rate and Rhythm: Normal rate and regular rhythm.     Heart sounds: Normal heart sounds.  Pulmonary:     Effort: Pulmonary effort is normal. No respiratory distress.     Breath sounds: Wheezing and rales present.  Skin:    General: Skin is warm and dry.  Neurological:     Mental Status: She is alert and oriented to person, place, and time.  Psychiatric:        Mood and Affect: Mood normal.        Behavior: Behavior normal.        Thought Content: Thought content normal.        Judgment: Judgment normal.     No results found for any visits on 02/13/24.      Assessment & Plan:   Problem List Items Addressed This Visit   None Visit Diagnoses       Acute cough    -  Primary   Relevant Medications   guaiFENesin (MUCINEX) 600 MG 12 hr tablet   albuterol  (VENTOLIN  HFA) 108 (90 Base) MCG/ACT inhaler   doxycycline  (VIBRA -TABS) 100 MG tablet   Other Relevant Orders   DG Chest 2  View (Completed)      Given duration and symptoms, sending in doxycycline , albuterol , and Mucinex. Continue supportive measures including rest, hydration, humidifier use, steam showers, warm compresses to sinuses, warm liquids with lemon and honey, and over-the-counter cough, cold, and analgesics as needed.  CXR given abnormal breath sounds and mildly decreased O2 sat.      Update - CXR today showed "Rounded nodular opacity projecting over the medial right upper lung, which may represent a pulmonary nodule. Recommend further evaluation with chest CT." Called patient to discuss results. Questions answered. CT ordered. Patient aware of signs/symptoms requiring further/urgent evaluation.      Meds ordered this encounter  Medications   guaiFENesin (MUCINEX) 600 MG 12 hr tablet    Sig: Take 2 tablets (1,200 mg total) by mouth 2 (two) times daily.    Dispense:  30 tablet    Refill:   0    Supervising Provider:   Randie Bustle A [[id number]]   albuterol  (VENTOLIN  HFA) 108 (90 Base) MCG/ACT inhaler    Sig: Inhale 2 puffs into the lungs every 6 (six) hours as needed for wheezing or shortness of breath.    Dispense:  8 g    Refill:  0    Supervising Provider:   Randie Bustle A [[id number]]   doxycycline  (VIBRA -TABS) 100 MG tablet    Sig: Take 1 tablet (100 mg total) by mouth 2 (two) times daily for 10 days.    Dispense:  20 tablet    Refill:  0    Supervising Provider:   Randie Bustle A [[id number]]    Return if symptoms worsen or fail to improve.  Everlina Hock, NP

## 2024-02-22 ENCOUNTER — Ambulatory Visit (HOSPITAL_BASED_OUTPATIENT_CLINIC_OR_DEPARTMENT_OTHER)
Admission: RE | Admit: 2024-02-22 | Discharge: 2024-02-22 | Disposition: A | Discharge status: Home / Self Care | Source: Ambulatory Visit | Attending: Family Medicine | Admitting: Family Medicine

## 2024-02-22 DIAGNOSIS — R051 Acute cough: Secondary | ICD-10-CM | POA: Insufficient documentation

## 2024-02-22 DIAGNOSIS — R918 Other nonspecific abnormal finding of lung field: Secondary | ICD-10-CM | POA: Diagnosis not present

## 2024-02-22 DIAGNOSIS — R9389 Abnormal findings on diagnostic imaging of other specified body structures: Secondary | ICD-10-CM | POA: Insufficient documentation

## 2024-02-22 DIAGNOSIS — R062 Wheezing: Secondary | ICD-10-CM | POA: Diagnosis not present

## 2024-02-24 NOTE — Progress Notes (Signed)
 73 y.o. [id number] postmenopausal female here for annual exam. Married.  She reports ***.  Postmenopausal bleeding: *** Pelvic discharge or pain: *** Breast mass, nipple discharge or skin changes : *** Sexually active: ***   Last PAP:     Component Value Date/Time   DIAGPAP  02/22/2022 1017    - Negative for intraepithelial lesion or malignancy (NILM)   ADEQPAP  02/22/2022 1017    Satisfactory for evaluation; transformation zone component PRESENT.   Last mammogram: 02/08/23 *** Last DXA: 03/11/23 *** Last colonoscopy: 08/07/19 ***  Exercising: *** Smoker:***  Flowsheet Row Nutrition from 02/09/2024 in [facility] Nutr Diab Ed  - A Dept Of . [facility]  PHQ-2 Total Score 0       GYN HISTORY: ***  OB History  Gravida Para Term Preterm AB Living  4 4 4   4   SAB IAB Ectopic Multiple Live Births      4    # Outcome Date GA Lbr Len/2nd Weight Sex Type Anes PTL Lv  4 Term     M CS-Unspec  N LIV  3 Term     M Vag-Spont  N LIV  2 Term     M Vag-Spont  N LIV  1 Term     M Vag-Spont  N LIV   Past Medical History:  Diagnosis Date   Breast cancer (HCC)    Cancer (HCC) 08/2011   right breast   History of breast cancer 07/2011   right   History of chemotherapy 11/2011   HSV infection    oral   Hyperlipidemia    no current med.   Personal history of chemotherapy    2012   Personal history of radiation therapy    2012   SVD (spontaneous vaginal delivery)    x 3   Past Surgical History:  Procedure Laterality Date   BREAST LUMPECTOMY Right 08/2011   CARPAL TUNNEL RELEASE Left 06/19/2013   Procedure: CARPAL TUNNEL RELEASE;  Surgeon: Amelie Baize., MD;  Location: [facility];  Service: Orthopedics;  Laterality: Left;   CESAREAN SECTION     x 1   COLON SURGERY     Hx polyps   LAPAROSCOPIC SALPINGO OOPHERECTOMY Bilateral 04/13/2016   Procedure: LAPAROSCOPIC SALPINGO OOPHORECTOMY;  Surgeon: Davia Erps, MD;  Location: [facility];   Service: Gynecology;  Laterality: Bilateral;   PORT-A-CATH REMOVAL  06/2012   PORTACATH PLACEMENT  2013   SENTINEL LYMPH NODE BIOPSY Right 08/2011   WISDOM TOOTH EXTRACTION     Current Outpatient Medications on File Prior to Visit  Medication Sig Dispense Refill   albuterol  (VENTOLIN  HFA) 108 (90 Base) MCG/ACT inhaler Inhale 2 puffs into the lungs every 6 (six) hours as needed for wheezing or shortness of breath. 8 g 0   cholecalciferol (VITAMIN D3) 25 MCG (1000 UT) tablet Take 1,000 Units by mouth daily.     guaiFENesin  (MUCINEX ) 600 MG 12 hr tablet Take 2 tablets (1,200 mg total) by mouth 2 (two) times daily. 30 tablet 0   MAGNESIUM PO Take by mouth.     mirabegron ER (MYRBETRIQ) 50 MG TB24 tablet Take 50 mg by mouth daily.     Multiple Vitamin (MULTIVITAMIN WITH MINERALS) TABS tablet Take 1 tablet by mouth daily.     rosuvastatin  (CRESTOR ) 10 MG tablet Take 1 tablet (10 mg total) by mouth at bedtime. 90 tablet 1   valACYclovir  (VALTREX ) 1000 MG tablet For each episode,  take 2 tablets twice a day for 1 day 30 tablet 2   No current facility-administered medications on file prior to visit.   Social History   Socioeconomic History   Marital status: Married    Spouse name: Not on file   Number of children: 4   Years of education: Not on file   Highest education level: Not on file  Occupational History   Occupation: retired from the [employer]  Tobacco Use   Smoking status: Former    Current packs/day: 0.00    Types: Cigarettes    Start date: 09/13/1998    Quit date: 09/13/2001    Years since quitting: 22.4   Smokeless tobacco: Never   Tobacco comments:       Vaping Use   Vaping status: Never Used  Substance and Sexual Activity   Alcohol use: No   Drug use: No   Sexual activity: Not Currently    Partners: Male    Birth control/protection: Post-menopausal, None    Comment: 1st intercourse- 16, partners- 3  Other Topics Concern   Not on file  Social History Narrative    Original from [place]   Lives w/ husband, travels frequently   Social Drivers of Health   Financial Resource Strain: Not on file  Food Insecurity: No Food Insecurity (02/09/2024)   Hunger Vital Sign    Worried About Running Out of Food in the Last Year: Never true    Ran Out of Food in the Last Year: Never true  Transportation Needs: Not on file  Physical Activity: Not on file  Stress: Not on file  Social Connections: Not on file  Intimate Partner Violence: Not on file   Family History  Problem Relation Age of Onset   Colon cancer Mother        dx postpartum   Congestive Heart Failure Mother    Alzheimer's disease Mother    Diabetes Father    Breast cancer Maternal Aunt    Breast cancer Maternal Aunt    Diabetes Paternal Grandmother    Breast cancer Cousin 8   Breast cancer Cousin    AAA (abdominal aortic aneurysm) Neg Hx    CAD Neg Hx    Lung cancer Neg Hx    Allergies  Allergen Reactions   Penicillins Rash    Has patient had a PCN reaction causing immediate rash, facial/tongue/throat swelling, SOB or lightheadedness with hypotension: No Has patient had a PCN reaction causing severe rash involving mucus membranes or skin necrosis: No Has patient had a PCN reaction that required hospitalization No Has patient had a PCN reaction occurring within the last 10 years: Yes If all of the above answers are NO, then may proceed with Cephalosporin use.    Poractant Alfa Rash   Pork-Derived Products Rash      PE There were no vitals filed for this visit. There is no height or weight on file to calculate BMI.  Physical Exam    Assessment and Plan:        There are no diagnoses linked to this encounter. Maximino Spanner, CMA

## 2024-02-27 ENCOUNTER — Ambulatory Visit: Admitting: Obstetrics and Gynecology

## 2024-02-27 ENCOUNTER — Other Ambulatory Visit: Payer: Self-pay | Admitting: Internal Medicine

## 2024-02-27 ENCOUNTER — Encounter: Payer: Self-pay | Admitting: Obstetrics and Gynecology

## 2024-02-27 VITALS — BP 104/58 | HR 87 | Resp 14 | Ht <= 58 in | Wt 175.0 lb

## 2024-02-27 DIAGNOSIS — N958 Other specified menopausal and perimenopausal disorders: Secondary | ICD-10-CM | POA: Diagnosis not present

## 2024-02-27 DIAGNOSIS — M8588 Other specified disorders of bone density and structure, other site: Secondary | ICD-10-CM | POA: Diagnosis not present

## 2024-02-27 DIAGNOSIS — Z9189 Other specified personal risk factors, not elsewhere classified: Secondary | ICD-10-CM

## 2024-02-27 DIAGNOSIS — Z1331 Encounter for screening for depression: Secondary | ICD-10-CM

## 2024-02-27 DIAGNOSIS — B009 Herpesviral infection, unspecified: Secondary | ICD-10-CM

## 2024-02-27 DIAGNOSIS — Z1231 Encounter for screening mammogram for malignant neoplasm of breast: Secondary | ICD-10-CM

## 2024-02-27 DIAGNOSIS — Z853 Personal history of malignant neoplasm of breast: Secondary | ICD-10-CM

## 2024-02-27 DIAGNOSIS — Z01419 Encounter for gynecological examination (general) (routine) without abnormal findings: Secondary | ICD-10-CM | POA: Insufficient documentation

## 2024-02-27 NOTE — Assessment & Plan Note (Signed)
 Continue vitamin D +Calcium  Encouraged weight based exercise DXA due next yr

## 2024-02-27 NOTE — Progress Notes (Signed)
 73 y.o. [id number] postmenopausal female with history of right breast cancer (diagnosed 2012 s/p right lumpectomy, chemo RT, tamoxifen  and Anastrozole , finished October 2018), hx of BSO (2017), osteopenia here for annual exam. Married.  Originally from [place].  She reports vaginal dryness with intercourse and low libido. She also notes OAB sx, slightly improved with myrbetriq, prescribed by urology Recovering from bronchitis, was sick for 2 weeks Seeing a nutritionist. Planning to start walking twice as week  Postmenopausal bleeding: none Pelvic discharge or pain: none Breast mass, nipple discharge or skin changes : none Sexually active: no   Last PAP:     Component Value Date/Time   DIAGPAP  02/22/2022 1017    - Negative for intraepithelial lesion or malignancy (NILM)   ADEQPAP  02/22/2022 1017    Satisfactory for evaluation; transformation zone component PRESENT.   Last mammogram: 02/08/23 BI-RADS 1, density C Last DXA: 03/11/23 Osteopenia -1.5 at Lt Femoral Neck.  Improved x last BD at that level. Repeat BD in 2 years.  Last colonoscopy: 08/07/19, q72yr  Exercising: no Smoker:former  Flowsheet Row Office Visit from [facility]/27/2024 in [facility]  PHQ-2 Total Score 0      GYN HISTORY: H/o breast caner, 2012 >/= 5 sexual partners in a lifetime: Yes  H/O STD at any age: Yes   OB History  Gravida Para Term Preterm AB Living  4 4 4   4   SAB IAB Ectopic Multiple Live Births      4    # Outcome Date GA Lbr Len/2nd Weight Sex Type Anes PTL Lv  4 Term     M CS-Unspec  N LIV  3 Term     M Vag-Spont  N LIV  2 Term     M Vag-Spont  N LIV  1 Term     M Vag-Spont  N LIV   Past Medical History:  Diagnosis Date   Breast cancer (HCC)    Cancer (HCC) 08/2011   right breast   History of breast cancer 07/2011   right   History of chemotherapy 11/2011   HSV infection    oral   Hyperlipidemia    no current med.   Personal history of chemotherapy     2012   Personal history of radiation therapy    2012   SVD (spontaneous vaginal delivery)    x 3   Past Surgical History:  Procedure Laterality Date   BREAST LUMPECTOMY Right 08/2011   CARPAL TUNNEL RELEASE Left 06/19/2013   Procedure: CARPAL TUNNEL RELEASE;  Surgeon: Amelie Baize., MD;  Location: [facility];  Service: Orthopedics;  Laterality: Left;   CESAREAN SECTION     x 1   COLON SURGERY     Hx polyps   LAPAROSCOPIC SALPINGO OOPHERECTOMY Bilateral 04/13/2016   Procedure: LAPAROSCOPIC SALPINGO OOPHORECTOMY;  Surgeon: Davia Erps, MD;  Location: [facility];  Service: Gynecology;  Laterality: Bilateral;   PORT-A-CATH REMOVAL  06/2012   PORTACATH PLACEMENT  2013   SENTINEL LYMPH NODE BIOPSY Right 08/2011   WISDOM TOOTH EXTRACTION     Current Outpatient Medications on File Prior to Visit  Medication Sig Dispense Refill   albuterol  (VENTOLIN  HFA) 108 (90 Base) MCG/ACT inhaler Inhale 2 puffs into the lungs every 6 (six) hours as needed for wheezing or shortness of breath. 8 g 0   cholecalciferol (VITAMIN D3) 25 MCG (1000 UT) tablet Take 1,000 Units by mouth daily.  guaiFENesin  (MUCINEX ) 600 MG 12 hr tablet Take 2 tablets (1,200 mg total) by mouth 2 (two) times daily. 30 tablet 0   MAGNESIUM PO Take by mouth.     mirabegron ER (MYRBETRIQ) 50 MG TB24 tablet Take 50 mg by mouth daily.     Multiple Vitamin (MULTIVITAMIN WITH MINERALS) TABS tablet Take 1 tablet by mouth daily.     rosuvastatin  (CRESTOR ) 10 MG tablet Take 1 tablet (10 mg total) by mouth at bedtime. 90 tablet 1   valACYclovir  (VALTREX ) 1000 MG tablet For each episode, take 2 tablets twice a day for 1 day 30 tablet 2   No current facility-administered medications on file prior to visit.   Social History   Socioeconomic History   Marital status: Married    Spouse name: Not on file   Number of children: 4   Years of education: Not on file   Highest education level: Not on file  Occupational  History   Occupation: retired from the [employer]  Tobacco Use   Smoking status: Former    Current packs/day: 0.00    Types: Cigarettes    Start date: 09/13/1998    Quit date: 09/13/2001    Years since quitting: 22.4   Smokeless tobacco: Never   Tobacco comments:       Vaping Use   Vaping status: Never Used  Substance and Sexual Activity   Alcohol use: No   Drug use: No   Sexual activity: Not Currently    Partners: Male    Birth control/protection: Post-menopausal, None    Comment: 1st intercourse- 16, partners- 3  Other Topics Concern   Not on file  Social History Narrative   Original from [place]   Lives w/ husband, travels frequently   Social Drivers of Health   Financial Resource Strain: Not on file  Food Insecurity: No Food Insecurity (02/09/2024)   Hunger Vital Sign    Worried About Running Out of Food in the Last Year: Never true    Ran Out of Food in the Last Year: Never true  Transportation Needs: Not on file  Physical Activity: Not on file  Stress: Not on file  Social Connections: Not on file  Intimate Partner Violence: Not on file   Family History  Problem Relation Age of Onset   Colon cancer Mother        dx postpartum   Congestive Heart Failure Mother    Alzheimer's disease Mother    Diabetes Father    Breast cancer Maternal Aunt    Breast cancer Maternal Aunt    Diabetes Paternal Grandmother    Breast cancer Cousin 2   Breast cancer Cousin    AAA (abdominal aortic aneurysm) Neg Hx    CAD Neg Hx    Lung cancer Neg Hx    Allergies  Allergen Reactions   Penicillins Rash    Has patient had a PCN reaction causing immediate rash, facial/tongue/throat swelling, SOB or lightheadedness with hypotension: No Has patient had a PCN reaction causing severe rash involving mucus membranes or skin necrosis: No Has patient had a PCN reaction that required hospitalization No Has patient had a PCN reaction occurring within the last 10 years: Yes If all of the  above answers are NO, then may proceed with Cephalosporin use.    Poractant Alfa Rash   Pork-Derived Products Rash      PE Today's Vitals   02/27/24 1132  BP: (!) 104/58  Pulse: 87  Resp: 14  Weight:  175 lb (79.4 kg)  Height: 4' 10 (1.473 m)   Body mass index is 36.58 kg/m.  Physical Exam Vitals reviewed. Exam conducted with a chaperone present.  Constitutional:      General: She is not in acute distress.    Appearance: Normal appearance.  HENT:     Head: Normocephalic and atraumatic.     Nose: Nose normal.   Eyes:     Extraocular Movements: Extraocular movements intact.     Conjunctiva/sclera: Conjunctivae normal.   Neck:     Thyroid : No thyroid  mass, thyromegaly or thyroid  tenderness.  Pulmonary:     Effort: Pulmonary effort is normal.  Chest:     Chest wall: No mass or tenderness.  Breasts:    Right: Normal. No swelling, mass, nipple discharge, skin change or tenderness.     Left: Normal. No swelling, mass, nipple discharge, skin change or tenderness.  Abdominal:     General: There is no distension.     Palpations: Abdomen is soft.     Tenderness: There is no abdominal tenderness.  Genitourinary:    General: Normal vulva.     Exam position: Lithotomy position.     Urethra: No prolapse.     Vagina: Normal. No vaginal discharge or bleeding.     Cervix: Normal. No lesion.     Uterus: Normal. Not enlarged and not tender.      Adnexa: Right adnexa normal and left adnexa normal.   Musculoskeletal:        General: Normal range of motion.     Cervical back: Normal range of motion.  Lymphadenopathy:     Upper Body:     Right upper body: No axillary adenopathy.     Left upper body: No axillary adenopathy.     Lower Body: No right inguinal adenopathy. No left inguinal adenopathy.   Skin:    General: Skin is warm and dry.   Neurological:     General: No focal deficit present.     Mental Status: She is alert.   Psychiatric:        Mood and Affect: Mood  normal.        Behavior: Behavior normal.      Assessment and Plan:        Encounter for breast and pelvic examination Assessment & Plan: Cervical cancer screening performed according to ASCCP guidelines. Encouraged annual mammogram screening Colonoscopy UTD DXA UTD Labs and immunizations with her primary Encouraged safe sexual practices as indicated Encouraged healthy lifestyle practices with diet and exercise For patients over 70yo, I recommend 1200mg  calcium  daily and 800IU of vitamin D  daily.   Osteopenia of lumbar spine Assessment & Plan: Continue vitamin D +Calcium  Encouraged weight based exercise DXA due next yr  History of breast cancer Normal exam  [employer] Also discussed lubricants Considering applying aquaphor or coconut oil to the vulva after showers. You could also using daily vaginal moisturizers by brands like Good Clean Love and Ah! Yes. Could consider PV estradiol if approved by oncology, however start with nonhormonal options  Other specified personal risk factors, not elsewhere classified  Negative depression screening   Romaine Closs, MD

## 2024-02-27 NOTE — Patient Instructions (Addendum)
 Considering applying aquaphor or coconut oil to the vulva after showers. You could also using daily vaginal moisturizers by brands like Good Clean Love and Ah! Yes.  For patients under 50-75yo, I recommend 1200mg  calcium daily and 600IU of vitamin D daily. For patients over 75yo, I recommend 1200mg  calcium daily and 800IU of vitamin D daily.  Health Maintenance, Female Adopting a healthy lifestyle and getting preventive care are important in promoting health and wellness. Ask your health care provider about: The right schedule for you to have regular tests and exams. Things you can do on your own to prevent diseases and keep yourself healthy. What should I know about diet, weight, and exercise? Eat a healthy diet  Eat a diet that includes plenty of vegetables, fruits, low-fat dairy products, and lean protein. Do not eat a lot of foods that are high in solid fats, added sugars, or sodium. Maintain a healthy weight Body mass index (BMI) is used to identify weight problems. It estimates body fat based on height and weight. Your health care provider can help determine your BMI and help you achieve or maintain a healthy weight. Get regular exercise Get regular exercise. This is one of the most important things you can do for your health. Most adults should: Exercise for at least 150 minutes each week. The exercise should increase your heart rate and make you sweat (moderate-intensity exercise). Do strengthening exercises at least twice a week. This is in addition to the moderate-intensity exercise. Spend less time sitting. Even light physical activity can be beneficial. Watch cholesterol and blood lipids Have your blood tested for lipids and cholesterol at 75 years of age, then have this test every 5 years. Have your cholesterol levels checked more often if: Your lipid or cholesterol levels are high. You are older than 75 years of age. You are at high risk for heart disease. What should I know  about cancer screening? Depending on your health history and family history, you may need to have cancer screening at various ages. This may include screening for: Breast cancer. Cervical cancer. Colorectal cancer. Skin cancer. Lung cancer. What should I know about heart disease, diabetes, and high blood pressure? Blood pressure and heart disease High blood pressure causes heart disease and increases the risk of stroke. This is more likely to develop in people who have high blood pressure readings or are overweight. Have your blood pressure checked: Every 3-5 years if you are 29-15 years of age. Every year if you are 55 years old or older. Diabetes Have regular diabetes screenings. This checks your fasting blood sugar level. Have the screening done: Once every three years after age 39 if you are at a normal weight and have a low risk for diabetes. More often and at a younger age if you are overweight or have a high risk for diabetes. What should I know about preventing infection? Hepatitis B If you have a higher risk for hepatitis B, you should be screened for this virus. Talk with your health care provider to find out if you are at risk for hepatitis B infection. Hepatitis C Testing is recommended for: Everyone born from [birth year] through 1967. Anyone with known risk factors for hepatitis C. Sexually transmitted infections (STIs) Get screened for STIs, including gonorrhea and chlamydia, if: You are sexually active and are younger than 75 years of age. You are older than 75 years of age and your health care provider tells you that you are at risk for this type  of infection. Your sexual activity has changed since you were last screened, and you are at increased risk for chlamydia or gonorrhea. Ask your health care provider if you are at risk. Ask your health care provider about whether you are at high risk for HIV. Your health care provider may recommend a prescription medicine to help prevent  HIV infection. If you choose to take medicine to prevent HIV, you should first get tested for HIV. You should then be tested every 3 months for as long as you are taking the medicine. Osteoporosis and menopause Osteoporosis is a disease in which the bones lose minerals and strength with aging. This can result in bone fractures. If you are 13 years old or older, or if you are at risk for osteoporosis and fractures, ask your health care provider if you should: Be screened for bone loss. Take a calcium or vitamin D supplement to lower your risk of fractures. Be given hormone replacement therapy (HRT) to treat symptoms of menopause. Follow these instructions at home: Alcohol use Do not drink alcohol if: Your health care provider tells you not to drink. You are pregnant, may be pregnant, or are planning to become pregnant. If you drink alcohol: Limit how much you have to: 0-1 drink a day. Know how much alcohol is in your drink. In the [place] one drink equals one 12 oz bottle of beer (355 mL), one 5 oz glass of wine (148 mL), or one 1 oz glass of hard liquor (44 mL). Lifestyle Do not use any products that contain nicotine or tobacco. These products include cigarettes, chewing tobacco, and vaping devices, such as e-cigarettes. If you need help quitting, ask your health care provider. Do not use street drugs. Do not share needles. Ask your health care provider for help if you need support or information about quitting drugs. General instructions Schedule regular health, dental, and eye exams. Stay current with your vaccines. Tell your health care provider if: You often feel depressed. You have ever been abused or do not feel safe at home. Summary Adopting a healthy lifestyle and getting preventive care are important in promoting health and wellness. Follow your health care provider's instructions about healthy diet, exercising, and getting tested or screened for diseases. Follow your health care  provider's instructions on monitoring your cholesterol and blood pressure. This information is not intended to replace advice given to you by your health care provider. Make sure you discuss any questions you have with your health care provider. Document Revised: 01/19/2021 Document Reviewed: 01/19/2021 Elsevier Patient Education  2024 [employer].

## 2024-02-27 NOTE — Assessment & Plan Note (Signed)
 Cervical cancer screening performed according to ASCCP guidelines. Encouraged annual mammogram screening Colonoscopy UTD DXA UTD Labs and immunizations with her primary Encouraged safe sexual practices as indicated Encouraged healthy lifestyle practices with diet and exercise For patients over 75yo, I recommend 1200mg  calcium daily and 800IU of vitamin D daily.

## 2024-03-08 ENCOUNTER — Ambulatory Visit
Admission: RE | Admit: 2024-03-08 | Discharge: 2024-03-08 | Disposition: A | Discharge status: Home / Self Care | Source: Ambulatory Visit | Attending: Internal Medicine | Admitting: Internal Medicine

## 2024-03-08 DIAGNOSIS — R3121 Asymptomatic microscopic hematuria: Secondary | ICD-10-CM | POA: Diagnosis not present

## 2024-03-08 DIAGNOSIS — Z1231 Encounter for screening mammogram for malignant neoplasm of breast: Secondary | ICD-10-CM

## 2024-03-08 DIAGNOSIS — N3941 Urge incontinence: Secondary | ICD-10-CM | POA: Diagnosis not present

## 2024-03-08 DIAGNOSIS — R350 Frequency of micturition: Secondary | ICD-10-CM | POA: Diagnosis not present

## 2024-03-13 ENCOUNTER — Ambulatory Visit: Payer: Self-pay

## 2024-03-13 ENCOUNTER — Encounter: Payer: Self-pay | Admitting: Internal Medicine

## 2024-03-13 ENCOUNTER — Other Ambulatory Visit: Payer: Self-pay | Admitting: Internal Medicine

## 2024-03-13 DIAGNOSIS — R928 Other abnormal and inconclusive findings on diagnostic imaging of breast: Secondary | ICD-10-CM

## 2024-03-29 ENCOUNTER — Ambulatory Visit
Admission: RE | Admit: 2024-03-29 | Discharge: 2024-03-29 | Disposition: A | Discharge status: Home / Self Care | Source: Ambulatory Visit | Attending: Internal Medicine | Admitting: Internal Medicine

## 2024-03-29 DIAGNOSIS — R928 Other abnormal and inconclusive findings on diagnostic imaging of breast: Secondary | ICD-10-CM | POA: Diagnosis not present

## 2024-04-05 DIAGNOSIS — R3129 Other microscopic hematuria: Secondary | ICD-10-CM | POA: Diagnosis not present

## 2024-04-05 DIAGNOSIS — N281 Cyst of kidney, acquired: Secondary | ICD-10-CM | POA: Diagnosis not present

## 2024-04-05 DIAGNOSIS — K8020 Calculus of gallbladder without cholecystitis without obstruction: Secondary | ICD-10-CM | POA: Diagnosis not present

## 2024-04-05 DIAGNOSIS — R3121 Asymptomatic microscopic hematuria: Secondary | ICD-10-CM | POA: Diagnosis not present

## 2024-04-11 ENCOUNTER — Ambulatory Visit: Admitting: Dietician

## 2024-05-01 DIAGNOSIS — N3941 Urge incontinence: Secondary | ICD-10-CM | POA: Diagnosis not present

## 2024-05-01 DIAGNOSIS — R350 Frequency of micturition: Secondary | ICD-10-CM | POA: Diagnosis not present

## 2024-05-20 ENCOUNTER — Other Ambulatory Visit: Payer: Self-pay | Admitting: Internal Medicine

## 2024-05-21 ENCOUNTER — Ambulatory Visit: Admitting: Internal Medicine

## 2024-06-01 ENCOUNTER — Ambulatory Visit: Admitting: Internal Medicine

## 2024-06-01 VITALS — BP 130/82 | HR 87 | Temp 98.2°F | Resp 16 | Ht <= 58 in | Wt 175.5 lb

## 2024-06-01 DIAGNOSIS — Z853 Personal history of malignant neoplasm of breast: Secondary | ICD-10-CM

## 2024-06-01 DIAGNOSIS — R739 Hyperglycemia, unspecified: Secondary | ICD-10-CM

## 2024-06-01 DIAGNOSIS — R232 Flushing: Secondary | ICD-10-CM | POA: Diagnosis not present

## 2024-06-01 DIAGNOSIS — E782 Mixed hyperlipidemia: Secondary | ICD-10-CM | POA: Diagnosis not present

## 2024-06-01 DIAGNOSIS — E559 Vitamin D deficiency, unspecified: Secondary | ICD-10-CM

## 2024-06-01 MED ORDER — GABAPENTIN 100 MG PO CAPS: 200.0000 mg | ORAL_CAPSULE | Freq: Every day | ORAL | 3 refills | Status: AC

## 2024-06-01 NOTE — Progress Notes (Signed)
 Subjective:    Patient ID: Nichole [name], female    DOB: 1951-02-27, 73 y.o.   MRN: [id number]  DOS:  06/01/2024 Type of visit - description: 64-month follow-up  Chronic medical problems addressed. In general feeling well. Report many years history of hot flashes, only at night, no associated fever.  No weight loss. Treatment? Wt Readings from Last 3 Encounters:  06/01/24 175 lb 8 oz (79.6 kg)  02/27/24 175 lb (79.4 kg)  02/13/24 174 lb (78.9 kg)    Review of Systems See above   Past Medical History:  Diagnosis Date   Breast cancer (HCC)    Cancer (HCC) 08/2011   right breast   History of breast cancer 07/2011   right   History of chemotherapy 11/2011   HSV infection    oral   Hyperlipidemia    no current med.   Personal history of chemotherapy    2012   Personal history of radiation therapy    2012   SVD (spontaneous vaginal delivery)    x 3    Past Surgical History:  Procedure Laterality Date   BREAST LUMPECTOMY Right 08/2011   CARPAL TUNNEL RELEASE Left 06/19/2013   Procedure: CARPAL TUNNEL RELEASE;  Surgeon: Lamar LULLA Leonor Mickey., MD;  Location: [facility];  Service: Orthopedics;  Laterality: Left;   CESAREAN SECTION     x 1   COLON SURGERY     Hx polyps   LAPAROSCOPIC SALPINGO OOPHERECTOMY Bilateral 04/13/2016   Procedure: LAPAROSCOPIC SALPINGO OOPHORECTOMY;  Surgeon: Curlee VEAR Guan, MD;  Location: [facility];  Service: Gynecology;  Laterality: Bilateral;   PORT-A-CATH REMOVAL  06/2012   PORTACATH PLACEMENT  2013   SENTINEL LYMPH NODE BIOPSY Right 08/2011   WISDOM TOOTH EXTRACTION      Current Outpatient Medications  Medication Instructions   albuterol  (VENTOLIN  HFA) 108 (90 Base) MCG/ACT inhaler 2 puffs, Inhalation, Every 6 hours PRN   cholecalciferol (VITAMIN D3) 1,000 Units, Daily   guaiFENesin  (MUCINEX ) 1,200 mg, Oral, 2 times daily   MAGNESIUM PO Take by mouth.   mirabegron ER (MYRBETRIQ) 50 mg, Daily   Multiple Vitamin  (MULTIVITAMIN WITH MINERALS) TABS tablet 1 tablet, Daily   rosuvastatin  (CRESTOR ) 10 MG tablet TAKE 1 TABLET BY MOUTH EVERYDAY AT BEDTIME   valACYclovir  (VALTREX ) 1000 MG tablet For each episode, take 2 tablets twice a day for 1 day   Zinc 30 MG CAPS Take by mouth.       Objective:   Physical Exam BP 130/82   Pulse 87   Temp 98.2 F (36.8 C) (Oral)   Resp 16   Ht 4' 10 (1.473 m)   Wt 175 lb 8 oz (79.6 kg)   SpO2 96%   BMI 36.68 kg/m  General:   Well developed, NAD, BMI noted. HEENT:  Normocephalic . Face symmetric, atraumatic Lungs:  CTA B Normal respiratory effort, no intercostal retractions, no accessory muscle use. Heart: RRR,  no murmur.  Lower extremities: no pretibial edema bilaterally  Skin: Not pale. Not jaundice Neurologic:  alert & oriented X3.  Speech normal, gait appropriate for age and unassisted Psych--  Cognition and judgment appear intact.  Cooperative with normal attention span and concentration.  Behavior appropriate. No anxious or depressed appearing.      Assessment     Assessment  (New patient 11-2016) Hyperglycemia, mild. Hyperlipidemia Vitamin D  deficiency Breast cancer R, 2012, lumpectomy, chemotherapy, s/p Port-A-Cath; d/c anastrazole 2018; d/c from oncology, f/u w/ PCP, will  need yearly MMG and clinical breast exam Reactive airway disease Herpes labialis- valtrex  prn Coronary calcium  score: 0 ([id number]) Sees urology- OAB? Hematuria  Lipoma, left abdomen.  PLAN   Hyperglycemia: check A1c. Hyperlipidemia: Based on last FLP, rosuvastatin  increased to 10 mg.  Reports good compliance.  Check FLP AST ALT Vitamin D  deficiency: On OTCs supplements, dose? Rec 2000 units daily.  Checking levels. Hot flashes: Has nocturnal flashes, states she discussed with her gynecologist and no treatment was offered because she has a history of breast cancer.  We discussed SSRIs, gabapentin .  At the end we agreed on a trial with gabapentin  100 mg 1 or 2 tablets  at bedtime.  We could increase the dose if necessary. History of breast cancer: Last MMG 02/2024. Preventive care: Recommend a flu and COVID-vaccine this fall.  Strongly declines. Saw gynecology 02/2024. RTC 6 months CPX

## 2024-06-01 NOTE — Patient Instructions (Signed)
 For hot flashes: Try gabapentin  100 mg: 1 tablet at bedtime.  You can increase it to 2 tablets at bedtime.  Continue taking over-the-counter vitamin D .  Take at least 2000 units every day    GO TO THE LAB :  Get the blood work   Your results will be posted on MyChart with my comments  Go to the front desk for the checkout Please make an appointment for a physical exam in 6 months

## 2024-06-02 DIAGNOSIS — Z853 Personal history of malignant neoplasm of breast: Secondary | ICD-10-CM | POA: Insufficient documentation

## 2024-06-02 LAB — LIPID PANEL
Cholesterol: 164 mg/dL (ref <200)
HDL: 60 mg/dL (ref >=50)
LDL Cholesterol (Calc): 79 mg/dL
Non-HDL Cholesterol (Calc): 104 mg/dL (ref <130)
Total CHOL/HDL Ratio: 2.7 (calc) (ref <5.0)
Triglycerides: 153 mg/dL — ABNORMAL HIGH (ref <150)

## 2024-06-02 LAB — ALT: ALT: 15 U/L (ref 6–29)

## 2024-06-02 LAB — VITAMIN D 25 HYDROXY (VIT D DEFICIENCY, FRACTURES): Vit D, 25-Hydroxy: 96 ng/mL (ref 30–100)

## 2024-06-02 LAB — HEMOGLOBIN A1C
Hgb A1c MFr Bld: 5.9 % — ABNORMAL HIGH (ref <5.7)
Mean Plasma Glucose: 123 mg/dL
eAG (mmol/L): 6.8 mmol/L

## 2024-06-02 LAB — AST: AST: 17 U/L (ref 10–35)

## 2024-06-02 NOTE — Assessment & Plan Note (Signed)
 Hyperglycemia: check A1c. Hyperlipidemia: Based on last FLP, rosuvastatin  increased to 10 mg.  Reports good compliance.  Check FLP AST ALT Vitamin D  deficiency: On OTCs supplements, dose? Rec 2000 units daily.  Checking levels. Hot flashes: Has nocturnal flashes, states she discussed with her gynecologist and no treatment was offered because she has a history of breast cancer.  We discussed SSRIs, gabapentin .  At the end we agreed on a trial with gabapentin  100 mg 1 or 2 tablets at bedtime.  We could increase the dose if necessary. History of breast cancer: Last MMG 02/2024. Preventive care: Recommend a flu and COVID-vaccine this fall.  Strongly declines. Saw gynecology 02/2024. RTC 6 months CPX

## 2024-06-05 ENCOUNTER — Ambulatory Visit: Payer: Self-pay | Admitting: Internal Medicine

## 2024-11-19 ENCOUNTER — Encounter: Admitting: Internal Medicine

## 2024-12-10 ENCOUNTER — Encounter: Admitting: Internal Medicine

## 2025-03-04 ENCOUNTER — Encounter: Admitting: Obstetrics and Gynecology
